# Patient Record
Sex: Female | Born: 1935 | Race: White | Hispanic: No | Smoking: Former smoker
Health system: Southern US, Community
[De-identification: ages and names within clinical notes are randomized; demographics above are authoritative.]

## PROBLEM LIST (undated history)

## (undated) DIAGNOSIS — I491 Atrial premature depolarization: Secondary | ICD-10-CM

## (undated) DIAGNOSIS — I10 Essential (primary) hypertension: Secondary | ICD-10-CM

## (undated) DIAGNOSIS — N189 Chronic kidney disease, unspecified: Secondary | ICD-10-CM

## (undated) DIAGNOSIS — E785 Hyperlipidemia, unspecified: Secondary | ICD-10-CM

## (undated) DIAGNOSIS — M1990 Unspecified osteoarthritis, unspecified site: Secondary | ICD-10-CM

## (undated) DIAGNOSIS — I509 Heart failure, unspecified: Secondary | ICD-10-CM

## (undated) DIAGNOSIS — D649 Anemia, unspecified: Secondary | ICD-10-CM

## (undated) DIAGNOSIS — M109 Gout, unspecified: Secondary | ICD-10-CM

## (undated) DIAGNOSIS — F419 Anxiety disorder, unspecified: Secondary | ICD-10-CM

## (undated) DIAGNOSIS — Z9981 Dependence on supplemental oxygen: Secondary | ICD-10-CM

## (undated) DIAGNOSIS — I701 Atherosclerosis of renal artery: Secondary | ICD-10-CM

## (undated) HISTORY — DX: Essential (primary) hypertension: I10

## (undated) HISTORY — DX: Unspecified osteoarthritis, unspecified site: M19.90

## (undated) HISTORY — DX: Chronic kidney disease, unspecified: N18.9

## (undated) HISTORY — DX: Hyperlipidemia, unspecified: E78.5

## (undated) HISTORY — PX: HAND SURGERY: SHX662

## (undated) HISTORY — PX: CATARACT EXTRACTION, BILATERAL: SHX1313

## (undated) HISTORY — PX: INSERTION OF DIALYSIS CATHETER: SHX1324

---

## 1998-03-08 ENCOUNTER — Other Ambulatory Visit: Admission: RE | Admit: 1998-03-08 | Discharge: 1998-03-08 | Payer: Self-pay | Admitting: Internal Medicine

## 1998-03-29 ENCOUNTER — Ambulatory Visit (HOSPITAL_BASED_OUTPATIENT_CLINIC_OR_DEPARTMENT_OTHER): Admission: RE | Admit: 1998-03-29 | Discharge: 1998-03-29 | Payer: Self-pay | Admitting: Orthopedic Surgery

## 1999-03-21 ENCOUNTER — Other Ambulatory Visit: Admission: RE | Admit: 1999-03-21 | Discharge: 1999-03-21 | Payer: Self-pay | Admitting: Internal Medicine

## 2000-03-21 ENCOUNTER — Encounter: Payer: Self-pay | Admitting: Internal Medicine

## 2000-03-21 ENCOUNTER — Encounter: Admission: RE | Admit: 2000-03-21 | Discharge: 2000-03-21 | Payer: Self-pay | Admitting: Internal Medicine

## 2000-06-11 HISTORY — PX: OTHER SURGICAL HISTORY: SHX169

## 2001-02-18 ENCOUNTER — Encounter: Admission: RE | Admit: 2001-02-18 | Discharge: 2001-02-18 | Payer: Self-pay | Admitting: Internal Medicine

## 2001-02-18 ENCOUNTER — Encounter: Payer: Self-pay | Admitting: Internal Medicine

## 2001-02-27 ENCOUNTER — Other Ambulatory Visit: Admission: RE | Admit: 2001-02-27 | Discharge: 2001-02-27 | Payer: Self-pay | Admitting: Internal Medicine

## 2002-03-18 ENCOUNTER — Encounter: Admission: RE | Admit: 2002-03-18 | Discharge: 2002-03-18 | Payer: Self-pay | Admitting: Internal Medicine

## 2002-03-18 ENCOUNTER — Encounter: Payer: Self-pay | Admitting: Internal Medicine

## 2003-04-02 ENCOUNTER — Encounter: Payer: Self-pay | Admitting: Internal Medicine

## 2003-04-02 ENCOUNTER — Encounter: Admission: RE | Admit: 2003-04-02 | Discharge: 2003-04-02 | Payer: Self-pay | Admitting: Internal Medicine

## 2004-05-10 ENCOUNTER — Encounter: Admission: RE | Admit: 2004-05-10 | Discharge: 2004-05-10 | Payer: Self-pay | Admitting: Internal Medicine

## 2004-12-29 ENCOUNTER — Ambulatory Visit (HOSPITAL_COMMUNITY): Admission: RE | Admit: 2004-12-29 | Discharge: 2004-12-29 | Payer: Self-pay | Admitting: Internal Medicine

## 2005-02-15 ENCOUNTER — Ambulatory Visit (HOSPITAL_COMMUNITY): Admission: RE | Admit: 2005-02-15 | Discharge: 2005-02-15 | Payer: Self-pay | Admitting: Vascular Surgery

## 2005-06-15 ENCOUNTER — Encounter: Admission: RE | Admit: 2005-06-15 | Discharge: 2005-06-15 | Payer: Self-pay | Admitting: Internal Medicine

## 2006-06-28 ENCOUNTER — Encounter: Admission: RE | Admit: 2006-06-28 | Discharge: 2006-06-28 | Payer: Self-pay | Admitting: Internal Medicine

## 2006-10-14 ENCOUNTER — Encounter: Admission: RE | Admit: 2006-10-14 | Discharge: 2006-10-14 | Payer: Self-pay | Admitting: Nephrology

## 2006-11-22 ENCOUNTER — Ambulatory Visit (HOSPITAL_COMMUNITY): Admission: RE | Admit: 2006-11-22 | Discharge: 2006-11-22 | Payer: Self-pay | Admitting: Nephrology

## 2006-11-29 ENCOUNTER — Ambulatory Visit: Payer: Self-pay | Admitting: Vascular Surgery

## 2007-07-23 ENCOUNTER — Encounter: Admission: RE | Admit: 2007-07-23 | Discharge: 2007-07-23 | Payer: Self-pay | Admitting: Internal Medicine

## 2008-09-02 ENCOUNTER — Encounter: Admission: RE | Admit: 2008-09-02 | Discharge: 2008-09-02 | Payer: Self-pay | Admitting: Internal Medicine

## 2008-12-17 ENCOUNTER — Ambulatory Visit: Payer: Self-pay | Admitting: Vascular Surgery

## 2009-09-13 ENCOUNTER — Encounter: Admission: RE | Admit: 2009-09-13 | Discharge: 2009-09-13 | Payer: Self-pay | Admitting: Internal Medicine

## 2009-12-21 ENCOUNTER — Ambulatory Visit: Payer: Self-pay | Admitting: Vascular Surgery

## 2010-09-22 ENCOUNTER — Other Ambulatory Visit: Payer: Self-pay | Admitting: Internal Medicine

## 2010-09-22 DIAGNOSIS — Z1231 Encounter for screening mammogram for malignant neoplasm of breast: Secondary | ICD-10-CM

## 2010-09-29 ENCOUNTER — Ambulatory Visit
Admission: RE | Admit: 2010-09-29 | Discharge: 2010-09-29 | Disposition: A | Discharge status: Home / Self Care | Payer: Medicare Other | Source: Ambulatory Visit | Attending: Internal Medicine | Admitting: Internal Medicine

## 2010-09-29 DIAGNOSIS — Z1231 Encounter for screening mammogram for malignant neoplasm of breast: Secondary | ICD-10-CM

## 2010-10-24 NOTE — Procedures (Signed)
RENAL ARTERY DUPLEX EVALUATION   INDICATION:  Follow up renal artery stenosis.   HISTORY:  Diabetes:  Yes.  Cardiac:  No.  Hypertension:  Yes.  Smoking:  Previous.   RENAL ARTERY DUPLEX FINDINGS:  Aorta-Proximal:  95 cm/s  Aorta-Mid:  Aorta-Distal:  Not visualized  Celiac Artery Origin:  169 cm/s  SMA Origin:  250 cm/s                                    RIGHT               LEFT  Renal Artery Origin:             75 cm/s             Not visualized  Renal Artery Proximal:           62 cm/s             400 cm/s  Renal Artery Mid:                75 cm/s             89 cm/s  Renal Artery Distal:             49 cm/s             Not visualized  Hilar Acceleration Time (AT):  Renal-Aortic Ratio (RAR):        1.3                 4.2  Kidney Size:                     9.9 cm              8.5 cm  End Diastolic Ratio (EDR):  Resistive Index (RI):            0.80                0.80   IMPRESSION:  1. Doppler velocities and renal aortic ratio suggest >60% stenosis of      the left proximal renal artery.  There is no significant stenosis      noted in the right renal artery.  2. Abnormal bilateral intrarenal resistive index noted.  3. Bilateral kidney lengths within normal limits; however, left kidney      is slightly smaller than the right.  4. There is a 2.0 cm cyst noted in the lower pole of the left kidney.  5. The distal aorta and left renal aorta origin and the distal renal      artery are not well visualized.  6. No significant changes from previous examinations.        ___________________________________________  Janetta Hora Fields, MD   NT/MEDQ  D:  12/21/2009  T:  12/21/2009  Job:  [id number]

## 2010-10-24 NOTE — Procedures (Signed)
RENAL ARTERY DUPLEX EVALUATION   INDICATION:  Increased creatinine of 3.0, renal artery stenosis.   HISTORY:  Diabetes:  Yes.  Cardiac:  No.  Hypertension:  Yes.  Smoking:  Previous.   RENAL ARTERY DUPLEX FINDINGS:  Aorta-Proximal:  85 cm/s  Aorta-Mid:  94 cm/s  Aorta-Distal:  Not visualized  Celiac Artery Origin:  141 cm/s  SMA Origin:  163 cm/s                                    RIGHT               LEFT  Renal Artery Origin:             119 cm/s            Not visualized  Renal Artery Proximal:           106 cm/s            521 cm/s  Renal Artery Mid:                85 cm/s             56 cm/s  Renal Artery Distal:             76 cm/s             25 cm/s  Hilar Acceleration Time (AT):  Renal-Aortic Ratio (RAR):        1.3                 5.5  Kidney Size:                     10.6 cm             9.3 cm  End Diastolic Ratio (EDR):  Resistive Index (RI):            0.91                0.84   IMPRESSION:  1. Doppler velocities and renal aortic ratios suggest a greater than      60% stenosis of the left proximal renal artery with no significant      stenosis noted in the right renal artery.  2. Abnormal bilateral intrarenal resistive indices noted.  3. The bilateral kidney length measurements are within normal limits,      however, the left kidney is slightly smaller than the right.  4. There is a 2.5 cm cyst noted in the lower pole of the left kidney.  5. The distal aorta and left renal artery origin were not adequately      visualized.  6. No significant change noted when compared to the previous exam on      11/29/2006.        ___________________________________________  Quita Skye. Hart Rochester, M.D.   CH/MEDQ  D:  12/17/2008  T:  12/17/2008  Job:  [id number]

## 2010-10-26 ENCOUNTER — Ambulatory Visit (INDEPENDENT_AMBULATORY_CARE_PROVIDER_SITE_OTHER): Payer: Medicare Other | Admitting: Vascular Surgery

## 2010-10-26 ENCOUNTER — Encounter (INDEPENDENT_AMBULATORY_CARE_PROVIDER_SITE_OTHER): Payer: Medicare Other

## 2010-10-26 DIAGNOSIS — Z01810 Encounter for preprocedural cardiovascular examination: Secondary | ICD-10-CM

## 2010-10-26 DIAGNOSIS — N186 End stage renal disease: Secondary | ICD-10-CM

## 2010-10-26 DIAGNOSIS — N184 Chronic kidney disease, stage 4 (severe): Secondary | ICD-10-CM

## 2010-10-27 NOTE — Assessment & Plan Note (Signed)
OFFICE VISIT  Kimberly Norman, Kimberly Norman DOB:  10/22/1918                                       10/26/2010 ZOXWR#:[id number]  CHIEF COMPLAINT:  Needs hemodialysis access.  HISTORY OF PRESENT ILLNESS:  The patient is an 77 year old female referred by Dr. Caryn Section for placement of long-term hemodialysis access.  She is currently not on dialysis.  She is right-handed.  Renal failure is thought to be secondary to multiple factors including hypertension, diabetes and left renal artery stenosis.  She also has pyelocaliectasis of the right kidney.  She currently does not have any symptoms of fluid overload or uremia.  Other chronic medical problems include intermittent claudication, hyperuricemia, hypercalcemia.  All of these problems are currently controlled and followed by Dr. Selena Batten and Dr. Caryn Section.  PAST SURGICAL HISTORY:  Uterine polyp in 1961, renal arteriogram by Dr. Hart Rochester in 2002.  SOCIAL HISTORY:  She is widowed.  She is retired.  She is a former smoker, quit in 1980.  She does not consume alcohol regularly.  FAMILY HISTORY:  Not remarkable for early vascular disease.  REVIEW OF SYSTEMS:  GI:  She has occasional constipation. URINARY:  As mentioned above. All other systems are negative.  MEDICATIONS: 1. Allopurinol 100 mg once a day. 2. Amlodipine 5 mg once a day. 3. Atorvastatin 40 mg once a day. 4. Carvedilol 6.25 mg 2 daily. 5. Furosemide 40 mg 2-1/2 daily. 6. Glimepiride 2 mg half tablet daily. 7. Hydralazine 10 mg 3 tablets daily. 8. Januvia 50 mg once daily. 9. Spirolactone 12.5 mg once daily. 10.Multivitamin.  ALLERGIES:  She has no known drug allergies.  PHYSICAL EXAM:  Vital signs:  Blood pressure is 178/75 in the left arm, heart rate 74 and regular, respirations 18.  HEENT:  Unremarkable. Neck:  Has 2+ carotid pulses without bruit.  Chest:  Clear to auscultation.  Cardiac:  Regular rate and rhythm without murmur. Abdomen:  Nontender, nondistended,  soft, no masses.  Musculoskeletal: Shows no obvious major joint deformities.  Neurologic:  Shows symmetric upper extremity and lower extremity motor strength which is 5/5.  Skin: Has no open ulcers or rashes.  Extremity vascular:  She has 2+ brachial, radial and femoral pulses bilaterally.  She had a vein mapping ultrasound performed today of both upper extremities.  Cephalic vein in the left arm was small and unusable.  The left basilic vein was between 40 and 60 mm in diameter.  In the right arm the right basilic vein was between 30 and 50 mm in diameter but is diminutive near the elbow.  In the right upper extremity cephalic vein the vein is between 23 mm at the wrist to as large as 50 mm at the antecubital area.  The upper arm cephalic vein is of slightly better quality but the right cephalic vein may be usable as well.  This also confirmed with placement of a tourniquet on the right arm.  I believe the best option for the patient at this point would be placement of a right radiocephalic AV fistula.  If the radial segment of the cephalic vein is small or the artery is small we would also consider a right brachiocephalic fistula at the same setting.  The patient wished to defer having her fistula placed immediately and has scheduled this for 12/27/2010.  Risks, benefits, possible complications and procedure details including but not  limited to bleeding, infection, ischemic steal, nonmaturation of the fistula were explained to the patient and her family today.  She understands and agrees to proceed.    Janetta Hora. Micharl Helmes, MD Electronically Signed  CEF/MEDQ  D:  10/26/2010  T:  10/27/2010  Job:  [id number]  cc:   Wilber Bihari. Caryn Section, M.D. Pamella Pert, MD Massie Maroon, MD

## 2010-10-27 NOTE — H&P (Signed)
Kimberly Norman, Kimberly Norman                 ACCOUNT NO.:  [id number]   MEDICAL RECORD NO.:  [id number]          PATIENT TYPE:  AMB   LOCATION:                               FACILITY:  [facility]   PHYSICIAN:  Quita Skye. Hart Rochester, M.D.  DATE OF BIRTH:  [birth year]   DATE OF ADMISSION:  02/15/2005  DATE OF DISCHARGE:                                HISTORY & PHYSICAL   PRIMARY PHYSICIAN:  Janae Bridgeman. Lendell Caprice, M.D.   CHIEF COMPLAINT:  Pain in both calves after walking 0.8 of a mile for the  past 6 months, newly found renal artery stenosis.   HISTORY OF PRESENT ILLNESS:  Kimberly Norman is an 77 year old Caucasian female  who was first evaluated by Dr. Quita Skye. Hart Rochester on February 06, 2005 for  bilateral lower extremity claudication symptoms relieved by rest. Pain is  worse in the left leg than in the right; and occurs after walking  approximately 0.8 of a mile.  She denies numbness and tingling other than  mild numbness of the tips of her left toe since undergoing surgery on her  left great toenail. She denies any rest pain or nonhealing ulcers. She has  mild peripheral edema, more pronounced in the left leg. Previously she has  undergone an MRA on December 29, 2004 which was reviewed by Dr. Hart Rochester and  revealed occlusive disease in a right superficial femoral artery and  proximal popliteal artery with one vessel runoff which reconstitutes  distally. The left leg had significant occlusive disease but no total  occlusion. She was also found to have what appeared to be a very tight left  renal artery stenosis and possible mild proximal right renal artery  stenosis. At this time due to her age and activity level, Dr. Hart Rochester  recommended that she increase her activity for her lower extremity occlusive  disease. He felt additional treatment should be reserved for worsening  symptoms. However, he did recommend that she undergo angiography for  possible PTA and stenting of her left renal artery with her history of  hypertension and mild renal insufficiency.   PAST MEDICAL HISTORY:  1.  Peripheral vascular occlusive disease with claudication.  2.  Renal artery stenosis as described above.  3.  Diabetes mellitus type 2.  4.  Hypertension.  5.  Hyperlipidemia.  6.  Mild renal insufficiency.  7.  Gouty arthritis.  8.  Osteoporosis.   PAST SURGICAL HISTORY:  1.  Right hand surgery.  2.  Removal of uterine polyps.  3.  Bilateral cataract extraction.   ALLERGIES:  She has no known drug allergies.   MEDICATIONS:  1.  Fosamax plus D 70 mg weekly.  2.  Metformin 500 mg p.o. b.i.d. (she was instructed to hold this until 48      hours following her procedure).  3.  Norvasc 5 mg p.o. daily.  4.  Lipitor 20 mg p.o. q.h.s.  5.  Furosemide 40 mg 1-1/2 tablets p.o. b.i.d.  6.  Allopurinol 100 mg p.o. daily.  7.  Calcium supplement daily.  8.  Multivitamin daily.   REVIEW OF  SYSTEMS:  She wears glasses. Has had bilateral ankle sprains in  the past; and is postmenopausal was 1975, otherwise, her review of systems  was negative per the CVTS review of systems questionnaire.   SOCIAL HISTORY:  She is married with no children. She used to smoke 1-1/2  packs of cigarettes per day from 1960 to 1977. She denies any alcohol use.  She is a retired Psychologist, occupational. She lives alone with her pets.   FAMILY HISTORY:  Her mother had a history of hypertension and died at age 67  of pancreatic cancer. Otherwise family history is negative for coronary  artery disease, diabetes mellitus, and stroke.   PHYSICAL EXAMINATION:  VITAL SIGNS:  Blood pressure 162/82, heart rate 96,  respirations 29.  GENERAL APPEARANCE:  This is an 77 year old Caucasian female who is alert,  cooperative and in no acute distress.  HEENT:  Her head is normocephalic and atraumatic. Her pupils are equal,  round, reacted to light and accommodation. Her sclerae nonicteric. Her oral  mucosa pink and moist. No lesions were noted.  NECK:  Her neck is  supple. No JVD or lymphadenopathy was noted. She has  palpable carotid pulses with no bruits auscultated.  RESPIRATORY:  Her lungs sounds were clear throughout. No wheezes or rhonchi  were noted.  CARDIAC:  Her heart has a regular rate and rhythm. No murmur, rub or gallop  was noted.  ABDOMEN:  Her abdomen was soft, nontender, nondistended with normoactive  bowel sounds x4. No hepatomegaly was noted.  GENITOURINARY/RECTAL:  These exams were deferred.  EXTREMITIES:  Her extremities are warm. She does have A trace ankle edema on  the left. She has 2+ radial pulses bilaterally and 2+ femoral pulses  bilaterally. Her dorsalis pedis pulse and posterior tibial pulse were not  definitively palpable. No ulcerations were noted.  NEUROLOGIC:  A neurologic exam was grossly intact. She is more alert and  oriented x4. Her speech is clear. Her gait is steady. Her bilateral upper  and lower muscle strength is strong and grossly symmetrical.   ASSESSMENT:  1.  Mild renal insufficiency per history with bilateral renal artery      stenosis, left greater than right with renovascular hypertension.  2.  Peripheral vascular occlusive disease with bilateral lower extremity      claudication, left greater than right.   PLAN:  1.  Arteriography with possible PTA and stenting of her left renal artery by      Dr. Quita Skye. Hart Rochester at [facility] on February 15, 2005.  2.  Increase activity for her lower extremity occlusive disease and reserve      additional treatment for worsening symptoms.      Jerold Coombe, P.A.    ______________________________  Quita Skye Hart Rochester, M.D.    AWZ/MEDQ  D:  02/13/2005  T:  02/13/2005  Job:  [id number]   cc:   Quita Skye. Hart Rochester, M.D.  [address]  [place]  [place] [address]

## 2010-10-27 NOTE — Op Note (Signed)
NAMEKESHONNA, VALVO                 ACCOUNT NO.:  [id number]   MEDICAL RECORD NO.:  [id number]          PATIENT TYPE:  AMB   LOCATION:  SDS                          FACILITY:  [facility]   PHYSICIAN:  Quita Skye. Hart Rochester, M.D.  DATE OF BIRTH:  04-Apr-1919   DATE OF PROCEDURE:  02/15/2005  DATE OF DISCHARGE:                                 OPERATIVE REPORT   PREOPERATIVE DIAGNOSIS:  Rule out renovascular hypertension with possible  severe left renal artery stenosis.   PROCEDURE:  1.  Abdominal aortogram via right common femoral approach.  2.  Selective left renal angiogram.   SURGEON:  Dr. Hart Rochester.   ANESTHESIA:  Local Xylocaine.   CONTRAST:  75 mL.   COMPLICATIONS:  None.   ESTIMATED BLOOD LOSS:  Minimal.   DESCRIPTION OF PROCEDURE:  The patient was taken to the [facility]  peripheral endovascular lab, placed in supine position at which time both  groins were prepped Betadine solution, draped in routine sterile manner.  After infiltration of 1% Xylocaine, the right common femoral artery was  entered percutaneously. Guidewire passed into the suprarenal aorta under  fluoroscopic guidance. A 5-French sheath and dilator were passed over the  guide wire, dilator removed. The standard pigtail catheter positioned in the  suprarenal aorta. A flush abdominal aortogram was performed injecting 20 mL  of contrast at 20 mL per second. This revealed the aorta to be widely patent  with both common internal and external iliac arteries to also be widely  patent with no evidence of significant stenosis. There was some moderate  occlusive disease in the proximal left renal artery and mild occlusive  disease in the proximal right renal artery and a magnification view was  obtained injecting 15 mL of contrast at 20 mL per second. This revealed the  origin of the right renal artery to have an approximate 30% stenosis and  what appeared to be an approximate 60% stenosis in the origin of the left  renal  artery. The pigtail catheter was exchanged for an IMA catheter and  selective left renal angiogram was performed with multiple views. There was  calcification over the first 2 cm of the proximal left renal artery and what  appeared to be an approximate 60% stenosis. The pressure gradient was  present occluded approximately 50 mm from the aorta to the distal renal  artery through the IMA catheter. After of multiple views were obtained, it  was felt that angioplasty and stenting would not be performed at this time  and the pigtail catheter was removed over the guidewire. The sheath was  removed, adequate compression applied. No complications ensued.   FINDINGS:  1.  Widely patent aortoiliac system.  2.  Mild right renal artery stenosis - 30%.  3.  Approximate 60% proximal left renal artery stenosis.           ______________________________  Quita Skye Hart Rochester, M.D.     JDL/MEDQ  D:  02/15/2005  T:  02/15/2005  Job:  [id number]

## 2010-11-04 NOTE — Procedures (Unsigned)
CEPHALIC VEIN MAPPING  INDICATION:  Chronic kidney disease, stage IV.  HISTORY: Diabetes, hypertension, hyperlipidemia, renal artery stenosis, and PVD.  EXAM: The right cephalic vein is compressible.  Diameter measurements range from 0.23 cm to 0.50 cm.  The right basilic vein is compressible.  Diameter measurements range from 0.36 to 0.56 cm.  The left cephalic vein is compressible.  Diameter measurements range from 0.13 cm to 0.27 cm.  The left basilic vein is compressible.  Diameter measurements range from 0.29 to 0.59 cm.  See attached worksheet for all measurements.  IMPRESSION:  Patent bilateral cephalic and basilic veins with diameter measurements as described above.  ___________________________________________ Janetta Hora. Fields, MD  SH/MEDQ  D:  10/26/2010  T:  10/26/2010  Job:  [id number]

## 2011-01-04 ENCOUNTER — Other Ambulatory Visit: Payer: Self-pay | Admitting: Vascular Surgery

## 2011-01-04 ENCOUNTER — Ambulatory Visit (HOSPITAL_COMMUNITY)
Admission: RE | Admit: 2011-01-04 | Discharge: 2011-01-04 | Disposition: A | Discharge status: Home / Self Care | Payer: Medicare Other | Source: Ambulatory Visit | Attending: Vascular Surgery | Admitting: Vascular Surgery

## 2011-01-04 ENCOUNTER — Encounter (HOSPITAL_COMMUNITY)
Admission: RE | Admit: 2011-01-04 | Discharge: 2011-01-04 | Disposition: A | Discharge status: Home / Self Care | Payer: Medicare Other | Source: Ambulatory Visit | Attending: Vascular Surgery | Admitting: Vascular Surgery

## 2011-01-04 DIAGNOSIS — Z01811 Encounter for preprocedural respiratory examination: Secondary | ICD-10-CM | POA: Insufficient documentation

## 2011-01-04 DIAGNOSIS — N186 End stage renal disease: Secondary | ICD-10-CM

## 2011-01-04 DIAGNOSIS — Z01810 Encounter for preprocedural cardiovascular examination: Secondary | ICD-10-CM | POA: Insufficient documentation

## 2011-01-04 DIAGNOSIS — I700 Atherosclerosis of aorta: Secondary | ICD-10-CM | POA: Insufficient documentation

## 2011-01-04 DIAGNOSIS — Z01812 Encounter for preprocedural laboratory examination: Secondary | ICD-10-CM | POA: Insufficient documentation

## 2011-01-04 LAB — SURGICAL PCR SCREEN: Staphylococcus aureus: NEGATIVE

## 2011-01-04 LAB — CBC
HCT: 33.4 % — ABNORMAL LOW (ref 36.0–46.0)
Hemoglobin: 11.8 g/dL — ABNORMAL LOW (ref 12.0–15.0)
MCH: 32.3 pg (ref 26.0–34.0)
RBC: 3.65 MIL/uL — ABNORMAL LOW (ref 3.87–5.11)

## 2011-01-04 LAB — BASIC METABOLIC PANEL
BUN: 56 mg/dL — ABNORMAL HIGH (ref 6–23)
Calcium: 10.6 mg/dL — ABNORMAL HIGH (ref 8.4–10.5)
Creatinine, Ser: 3.29 mg/dL — ABNORMAL HIGH (ref 0.50–1.10)
GFR calc Af Amer: 16 mL/min — ABNORMAL LOW (ref >60)
GFR calc non Af Amer: 13 mL/min — ABNORMAL LOW (ref >60)

## 2011-01-10 ENCOUNTER — Ambulatory Visit (HOSPITAL_COMMUNITY)
Admission: RE | Admit: 2011-01-10 | Discharge: 2011-01-10 | Disposition: A | Discharge status: Home / Self Care | Payer: Medicare Other | Source: Ambulatory Visit | Attending: Vascular Surgery | Admitting: Vascular Surgery

## 2011-01-10 DIAGNOSIS — Z01812 Encounter for preprocedural laboratory examination: Secondary | ICD-10-CM | POA: Insufficient documentation

## 2011-01-10 DIAGNOSIS — M109 Gout, unspecified: Secondary | ICD-10-CM | POA: Insufficient documentation

## 2011-01-10 DIAGNOSIS — N186 End stage renal disease: Secondary | ICD-10-CM

## 2011-01-10 DIAGNOSIS — I120 Hypertensive chronic kidney disease with stage 5 chronic kidney disease or end stage renal disease: Secondary | ICD-10-CM

## 2011-01-10 DIAGNOSIS — Z87891 Personal history of nicotine dependence: Secondary | ICD-10-CM | POA: Insufficient documentation

## 2011-01-10 DIAGNOSIS — E119 Type 2 diabetes mellitus without complications: Secondary | ICD-10-CM | POA: Insufficient documentation

## 2011-01-10 HISTORY — PX: AV FISTULA PLACEMENT: SHX1204

## 2011-01-10 LAB — POCT I-STAT 4, (NA,K, GLUC, HGB,HCT)
Glucose, Bld: 139 mg/dL — ABNORMAL HIGH (ref 70–99)
Hemoglobin: 11.2 g/dL — ABNORMAL LOW (ref 12.0–15.0)
Potassium: 4 mEq/L (ref 3.5–5.1)
Sodium: 134 mEq/L — ABNORMAL LOW (ref 135–145)

## 2011-01-10 LAB — GLUCOSE, CAPILLARY

## 2011-01-19 ENCOUNTER — Encounter: Payer: Self-pay | Admitting: Vascular Surgery

## 2011-01-25 ENCOUNTER — Encounter: Payer: Self-pay | Admitting: Vascular Surgery

## 2011-01-25 ENCOUNTER — Ambulatory Visit (INDEPENDENT_AMBULATORY_CARE_PROVIDER_SITE_OTHER): Payer: Medicare Other | Admitting: Vascular Surgery

## 2011-01-25 VITALS — BP 207/82 | HR 78 | Resp 16 | Ht 65.0 in | Wt 146.0 lb

## 2011-01-25 DIAGNOSIS — N186 End stage renal disease: Secondary | ICD-10-CM | POA: Insufficient documentation

## 2011-01-25 NOTE — Progress Notes (Signed)
Subjective:     Patient ID: Kimberly Norman, female   DOB: 1918-12-27, 77 y.o.   MRN: [id number]  HPI Patient is a 77 year old female who is status post a right radiocephalic AV fistula on 01/10/2011. She denies any numbness or tingling in her hand. She had some pain in her right wrist initially but this is now resolved. She is not currently on dialysis.  Review of Systems     Objective:   Physical Exam Blood pressure 207/82, pulse 78, resp. rate 16, height 5\' 5"  (1.651 m), weight 146 lb (66.225 kg).  Right wrist incision well-healed, palpable thrill    Assessment:     Healing right avf maturing well    Plan:     Follow up 3 months PA clinic to assess maturity

## 2011-02-08 NOTE — Op Note (Signed)
[name], Kimberly Norman                 ACCOUNT NO.:  [id number]  MEDICAL RECORD NO.:  [id number]  LOCATION:  SDSC                         FACILITY:  [facility]  PHYSICIAN:  Janetta Hora. Fields, MD  DATE OF BIRTH:  July 08, 1918  DATE OF PROCEDURE:  01/10/2011 DATE OF DISCHARGE:                              OPERATIVE REPORT   PROCEDURE:  Right radiocephalic AV fistula.  PREOPERATIVE DIAGNOSIS:  End-stage renal disease.  POSTOPERATIVE DIAGNOSIS:  End-stage renal disease.  ANESTHESIA:  Local with IV sedation.  ASSISTANT:  Newton Pigg, PA-C  OPERATIVE FINDINGS:  A 2.5-3-mm right cephalic vein.  OPERATIVE DETAILS:  After obtaining informed consent, the patient was taken to the operating room.  The patient was placed in supine position on operating table.  After adequate sedation, the patient's entire right upper extremity was prepped and draped in usual sterile fashion.  Local anesthesia was infiltrated over the area of the right radial artery near the wrist.  A longitudinal incision was made in this location, carried down through the subcutaneous tissues down to the level of the radial artery.  Radial artery was of good quality, it was soft in character and approximately 2.5 mm in diameter.  This was dissected free circumferentially and vessel loops were placed proximal and distal to the planned site of arteriotomy.  Several small side branches were controlled with small clips.  The patient's cephalic vein had a curve which placed it more on the dorsal aspect of the wrist.  Therefore, local anesthesia was infiltrated directly over this and an additional longitudinal incision was made parallel, but several centimeters away from the arterial exposure incision.  Incision was carried down through the subcutaneous tissues down to the level of cephalic vein.  The vein was of good quality.  This was dissected free circumferentially and small side branches ligated and divided between silk  ties.  The patient was given 5000 units of intravenous heparin.  The vein was then ligated distally with 2-0 silk tie and transected.  The vein was gently distended with heparinized saline.  Vein again was approximately 2.5-3 mm in diameter.  The vein was marked for orientation.  The vein was then tunneled subcutaneously under the skin bridge over to the radial artery. The radial artery was controlled proximally and distally with vessel loops.  A longitudinal opening was made in the radial artery.  The vein was slightly spatulated and sewn end of vein to side of artery using a running 7-0 Prolene suture.  Just prior to completion of anastomosis, this was forebled, backbled, and thoroughly flushed.  Anastomosis was secured.  Vessel loops were released.  There was a palpable thrill in the fistula immediately.  Subcutaneous tissues of both incisions were closed with a running 3-0 Vicryl suture.  Skin was closed with a 4-0 Monocryl stitch on both incisions. Dermabond was then applied.  The patient tolerated the procedure well and there were no complications.  Instrument, sponge, and needle counts were correct at the end of the case.  The patient was taken to recovery room in stable condition.     Janetta Hora. Fields, MD     CEF/MEDQ  D:  01/10/2011  T:  01/10/2011  Job:  [id number]  Electronically Signed by Fabienne Bruns MD on 02/08/2011 04:54:09 PM

## 2011-02-22 ENCOUNTER — Ambulatory Visit: Payer: Medicare Other

## 2011-03-08 ENCOUNTER — Encounter: Payer: Self-pay | Admitting: Vascular Surgery

## 2011-04-25 ENCOUNTER — Encounter: Payer: Self-pay | Admitting: Thoracic Diseases

## 2011-04-26 ENCOUNTER — Ambulatory Visit (INDEPENDENT_AMBULATORY_CARE_PROVIDER_SITE_OTHER): Payer: Medicare Other | Admitting: Thoracic Diseases

## 2011-04-26 VITALS — BP 203/84 | HR 115 | Resp 20 | Ht 64.5 in | Wt 147.0 lb

## 2011-04-26 DIAGNOSIS — N186 End stage renal disease: Secondary | ICD-10-CM

## 2011-04-26 NOTE — Progress Notes (Signed)
[facility]  Postoperative Visit hemodialysis access   Date of Surgery:01/10/11 Surgeon: CE Fields, MD   HPI: Kimberly Norman is a 77 y.o. female who is 12 weeks S/P creation/revision of right upper extremity Hemodialysis access. The patient denies symptoms of numbness, tingling, weakness and denies pain in the operative limb. Patient is here for post -op evaluation to assess healing and maturation of right Cimino Fistula .  Pt is not on hemodialysis   Physical Examination  Filed Vitals:   04/26/11 1602  BP: 203/84  Pulse: 115  Resp: 20    WDWN female in NAD.  right upper extremity Incision is healed Skin color is normal   Hand grip is 5/5 and sensation in digits is intact; There is a good thrill and good bruit in the right cimino Fistula. The graft/fistula is easily palpable and of adequate size  Assessment/Plan Kimberly Norman is a 77 y.o. year old who is s/p creation/revision of right upper extremity Hemodialysis access. Follow-up in as needed  The patient's access will be ready for use in immediately.  Clinic MD: CE Darrick Penna, MD

## 2012-02-10 ENCOUNTER — Emergency Department (HOSPITAL_COMMUNITY)
Admission: EM | Admit: 2012-02-10 | Discharge: 2012-02-10 | Disposition: A | Discharge status: Home / Self Care | Payer: Medicare Other | Attending: Emergency Medicine | Admitting: Emergency Medicine

## 2012-02-10 ENCOUNTER — Encounter (HOSPITAL_COMMUNITY): Payer: Self-pay | Admitting: Emergency Medicine

## 2012-02-10 ENCOUNTER — Emergency Department (HOSPITAL_COMMUNITY): Payer: Medicare Other

## 2012-02-10 DIAGNOSIS — E785 Hyperlipidemia, unspecified: Secondary | ICD-10-CM | POA: Insufficient documentation

## 2012-02-10 DIAGNOSIS — I129 Hypertensive chronic kidney disease with stage 1 through stage 4 chronic kidney disease, or unspecified chronic kidney disease: Secondary | ICD-10-CM | POA: Insufficient documentation

## 2012-02-10 DIAGNOSIS — R0602 Shortness of breath: Secondary | ICD-10-CM | POA: Insufficient documentation

## 2012-02-10 DIAGNOSIS — E119 Type 2 diabetes mellitus without complications: Secondary | ICD-10-CM | POA: Insufficient documentation

## 2012-02-10 DIAGNOSIS — F419 Anxiety disorder, unspecified: Secondary | ICD-10-CM

## 2012-02-10 DIAGNOSIS — R0600 Dyspnea, unspecified: Secondary | ICD-10-CM

## 2012-02-10 DIAGNOSIS — Z79899 Other long term (current) drug therapy: Secondary | ICD-10-CM | POA: Insufficient documentation

## 2012-02-10 DIAGNOSIS — N189 Chronic kidney disease, unspecified: Secondary | ICD-10-CM | POA: Insufficient documentation

## 2012-02-10 LAB — BASIC METABOLIC PANEL
BUN: 53 mg/dL — ABNORMAL HIGH (ref 6–23)
Calcium: 10.6 mg/dL — ABNORMAL HIGH (ref 8.4–10.5)
GFR calc Af Amer: 10 mL/min — ABNORMAL LOW (ref >90)
GFR calc non Af Amer: 9 mL/min — ABNORMAL LOW (ref >90)
Glucose, Bld: 140 mg/dL — ABNORMAL HIGH (ref 70–99)
Sodium: 137 mEq/L (ref 135–145)

## 2012-02-10 LAB — CBC
Hemoglobin: 10.4 g/dL — ABNORMAL LOW (ref 12.0–15.0)
MCH: 31.2 pg (ref 26.0–34.0)
MCHC: 33.8 g/dL (ref 30.0–36.0)
RDW: 14.2 % (ref 11.5–15.5)

## 2012-02-10 LAB — GLUCOSE, CAPILLARY

## 2012-02-10 NOTE — ED Provider Notes (Signed)
History     CSN: [id number]  Arrival date & time 02/10/12  1603   First MD Initiated Contact with Patient 02/10/12 1740      Chief Complaint  Patient presents with  . Shortness of Breath    (Consider location/radiation/quality/duration/timing/severity/associated sxs/prior treatment) HPI Comments: Ms. Leinbach presents for evaluation of shortness of breath.  After a busy morning, she returned home and attempted to take a nap at 13:30.  She was unable to go to sleep secondary to feeling short of breath.  She denies any fevers or pain but reports she stayed in bed for almost 2 hours prior to coming to the ER.  She had a similar episode in July and was seen by her primary physician.  She was treated with an antibiotic and albuterol inhaler as an outpt.  She used the inhaler today and reports some improvement since.  Currently she denies any shortness of breath.  Patient is a 78 y.o. female presenting with shortness of breath. The history is provided by the patient. No language interpreter was used.  Shortness of Breath  The current episode started today. The onset was sudden. The problem occurs continuously. The problem has been resolved. The problem is mild. The symptoms are relieved by beta-agonist inhalers. Nothing aggravates the symptoms. Associated symptoms include shortness of breath and wheezing. Pertinent negatives include no chest pain, no chest pressure, no orthopnea, no fever, no rhinorrhea, no sore throat, no stridor and no cough. There was no intake of a foreign body. She has not inhaled smoke recently. She has had no prior steroid use. She has had no prior hospitalizations. She has had no prior ICU admissions. She has had no prior intubations. Her past medical history is significant for past wheezing. Her past medical history does not include asthma, bronchiolitis, eczema or asthma in the family. She has been behaving normally. Urine output has been normal. The last void occurred less than 6  hours ago. There were no sick contacts. Recently, medical care has been given by the PCP.    Past Medical History  Diagnosis Date  . Hyperlipidemia   . Hypertension   . Diabetes mellitus Age 33    using insulin  . Chronic kidney disease   . Arthritis   . Osteoporosis   . Cataract   . Constipation 10/26/10    Past Surgical History  Procedure Date  . Renal angiogram 2002  . Cataract extraction, bilateral   . Hand surgery     right  . Av fistula placement 01/10/2011    Right radiocephalic AVF    Family History  Problem Relation Age of Onset  . Cancer Mother 39    pancreatic cancer  . Hypertension Mother     History  Substance Use Topics  . Smoking status: Former Smoker -- 17 years    Types: Cigarettes    Quit date: 01/24/1981  . Smokeless tobacco: Not on file  . Alcohol Use: No    OB History    Grav Para Term Preterm Abortions TAB SAB Ect Mult Living                  Review of Systems  Constitutional: Negative for fever, chills, diaphoresis, activity change, appetite change and fatigue.  HENT: Negative for congestion, sore throat, rhinorrhea, drooling, neck pain and neck stiffness.   Eyes: Negative.   Respiratory: Positive for shortness of breath and wheezing. Negative for cough, choking, chest tightness and stridor.   Cardiovascular: Negative for chest  pain, palpitations, orthopnea and leg swelling.  Gastrointestinal: Negative for nausea, vomiting, diarrhea and abdominal distention.  Genitourinary: Negative.   Musculoskeletal: Negative.   Skin: Negative.   Neurological: Negative.   Hematological: Negative.   Psychiatric/Behavioral: Negative.     Allergies  Review of patient's allergies indicates no known allergies.  Home Medications   Current Outpatient Rx  Name Route Sig Dispense Refill  . ALBUTEROL SULFATE HFA 108 (90 BASE) MCG/ACT IN AERS Inhalation Inhale 2 puffs into the lungs every 6 (six) hours as needed. For shortness of breath    .  ALLOPURINOL 100 MG PO TABS Oral Take 100 mg by mouth daily.      Marland Kitchen AMLODIPINE BESYLATE 5 MG PO TABS Oral Take 5 mg by mouth every evening.     . ATORVASTATIN CALCIUM 40 MG PO TABS Oral Take 40 mg by mouth daily.      Marland Kitchen CALCITRIOL 0.5 MCG PO CAPS Oral Take 0.5 mcg by mouth daily.    Marland Kitchen CARVEDILOL 6.25 MG PO TABS Oral Take 6.25 mg by mouth 2 (two) times daily with a meal.      . FUROSEMIDE 40 MG PO TABS Oral Take 40 mg by mouth 2 (two) times daily. 40 mg in the morning and 20 mg (1/2) tablet in the evening    . GLIMEPIRIDE 2 MG PO TABS Oral Take 2 mg by mouth daily.     Marland Kitchen HYDRALAZINE HCL 50 MG PO TABS Oral Take 50 mg by mouth 3 (three) times daily.    Marland Kitchen LORAZEPAM 0.5 MG PO TABS Oral Take 0.5 mg by mouth daily as needed. For anxiety caused by shortness of breath    . THERA M PLUS PO TABS Oral Take 1 tablet by mouth daily.    Marland Kitchen SITAGLIPTIN PHOSPHATE 50 MG PO TABS Oral Take 50 mg by mouth daily.     Marland Kitchen SPIRONOLACTONE 25 MG PO TABS Oral Take 12.5 mg by mouth daily.      BP 196/72  Pulse 89  Temp 98.6 F (37 C) (Oral)  Resp 18  SpO2 94%  Physical Exam  Nursing note and vitals reviewed. Constitutional: She is oriented to person, place, and time. She appears well-developed and well-nourished. No distress. She is not intubated.  HENT:  Head: Normocephalic and atraumatic.  Right Ear: External ear normal.  Left Ear: External ear normal.  Nose: Nose normal.  Mouth/Throat: Oropharynx is clear and moist. No oropharyngeal exudate.  Eyes: Conjunctivae and EOM are normal. Pupils are equal, round, and reactive to light. Right eye exhibits no discharge. Left eye exhibits no discharge. No scleral icterus.  Neck: Normal range of motion. Neck supple. No JVD present. No tracheal deviation present. No thyromegaly present.  Cardiovascular: Normal rate, regular rhythm, S1 normal, S2 normal, intact distal pulses and normal pulses.  PMI is not displaced.  Exam reveals friction rub. Exam reveals no gallop and no  decreased pulses.   No murmur heard. Pulmonary/Chest: Effort normal and breath sounds normal. No accessory muscle usage or stridor. No apnea, not tachypneic and not bradypneic. She is not intubated. No respiratory distress. She has no decreased breath sounds. She has no wheezes. She has no rales. She exhibits no tenderness, no bony tenderness, no deformity and no retraction.  Abdominal: Soft. Bowel sounds are normal. She exhibits no distension and no mass. There is no tenderness. There is no rebound and no guarding.  Musculoskeletal: Normal range of motion. She exhibits no edema and no tenderness.  Lymphadenopathy:  She has no cervical adenopathy.  Neurological: She is alert and oriented to person, place, and time. No cranial nerve deficit.  Skin: Skin is warm and dry. No rash noted. She is not diaphoretic. No erythema. No pallor.  Psychiatric: Her behavior is normal.    ED Course  Procedures (including critical care time)   Labs Reviewed  CBC  BASIC METABOLIC PANEL   Dg Chest 2 View  02/10/2012  *RADIOLOGY REPORT*  Clinical Data: Cough and shortness of breath which began earlier today.  CHEST - 2 VIEW  Comparison: Two-view chest x-ray 12/21/2011, 01/04/2011, 12/28/2009, 02/13/2005.  Findings: Cardiac silhouette moderately enlarged but stable. Thoracic aorta tortuous atherosclerotic, unchanged.  Hilar and mediastinal contours otherwise unremarkable.  Small to moderate- sized bilateral pleural effusions, left greater than right, increased in size since the most recent examination.  Mild diffuse interstitial pulmonary edema, slightly worse than on the prior examination.  Passive atelectasis in the lower lobes, left greater than right.  Osteopenia.  Degenerative changes throughout the thoracic spine.  IMPRESSION: Mild CHF, with stable moderate cardiomegaly and mild diffuse interstitial pulmonary edema.  Small to moderate sized bilateral effusions, left greater than right, and passive atelectasis  in the lower lobes.   Original Report Authenticated By: Arnell Sieving, M.D.      No diagnosis found.   Date: 02/10/2012  Rate: 82 bpm  Rhythm: normal sinus  QRS Axis: normal  Intervals: normal  ST/T Wave abnormalities: nonspecific ST changes  Conduction Disutrbances:none  Narrative Interpretation:   Old EKG Reviewed: unchanged      MDM  Pt presents for evaluation of shortness of breath.  She states that she feels better now.  Note normal respiratory effort, elevated BP, NAD.  CXR performed in triage demonstrates no infiltrate but mild edema and CHF.  She has a known hx of advanced renal failure and already has an AV fistula in place anticipating a future dialysis requirement.  Plan basic labs added to eval.  Will also se if O2 level drops while she is ambulating.  She currently demonstrates no clinical evidence of pneumonia or thromboembolic event.  She also denies chest pain.  2150.  Pt stable, NAD.  Pt is able to ambulate with only a small decrease in O2 sat.  She states this hs been a chronic issue.  She has no clinical evidence of PNA or PE.  She also has clear breath sounds and describes what sounds like panic or acute anxiety leading to these recurrent episodes of dyspnea,  She was even prescribed an anxiolytic by her physician.  Plan d/c home to f/u with her PMD.  Discussed at length indications for immediate return to the emergency department.      Tobin Chad, MD 02/10/12 2155

## 2012-02-10 NOTE — ED Notes (Signed)
Pt has right forearm graft that is positive for bruit and thrill.  Pt states that she has sob no matter what position and states increases with exertion.  Pt states it makes her anxious.

## 2012-02-10 NOTE — ED Notes (Signed)
Patient ambulated with pulse ox. Stast started at 95% with HR of 83. Wile walking patient stats dropped to 90% with HR of 95

## 2012-02-10 NOTE — ED Notes (Signed)
Dr.Powers to eval ecg at 16:43

## 2012-02-10 NOTE — ED Notes (Addendum)
C/o sob since 1:30pm today.  States she was unable to sleep this afternoon due to sob.  Pt reports same symptoms in July- diagnosed with pneumonia at that time.  Pt speaking in complete sentences.  NAD noted on triage exam.  Reports sob worse with exertion.

## 2012-06-02 ENCOUNTER — Encounter (HOSPITAL_COMMUNITY)
Admission: RE | Admit: 2012-06-02 | Discharge: 2012-06-02 | Disposition: A | Discharge status: Home / Self Care | Payer: Medicare Other | Source: Ambulatory Visit | Attending: Nephrology | Admitting: Nephrology

## 2012-06-02 DIAGNOSIS — N186 End stage renal disease: Secondary | ICD-10-CM | POA: Insufficient documentation

## 2012-06-02 DIAGNOSIS — D638 Anemia in other chronic diseases classified elsewhere: Secondary | ICD-10-CM | POA: Insufficient documentation

## 2012-06-02 MED ORDER — SODIUM CHLORIDE 0.9 % IV SOLN
INTRAVENOUS | Status: DC
  Administered 2012-06-02: 12:00:00 via INTRAVENOUS

## 2012-06-02 MED ORDER — FERUMOXYTOL INJECTION 510 MG/17 ML
INTRAVENOUS | Status: AC
  Administered 2012-06-02: 510 mg via INTRAVENOUS
  Filled 2012-06-02: qty 17

## 2012-06-02 MED ORDER — FERUMOXYTOL INJECTION 510 MG/17 ML
510.0000 mg | INTRAVENOUS | Status: DC
  Administered 2012-06-02: 510 mg via INTRAVENOUS

## 2012-06-09 ENCOUNTER — Encounter (HOSPITAL_COMMUNITY)
Admission: RE | Admit: 2012-06-09 | Discharge: 2012-06-09 | Disposition: A | Discharge status: Home / Self Care | Payer: Medicare Other | Source: Ambulatory Visit | Attending: Nephrology | Admitting: Nephrology

## 2012-06-09 MED ORDER — SODIUM CHLORIDE 0.9 % IV SOLN
INTRAVENOUS | Status: DC
  Administered 2012-06-09: 11:00:00 via INTRAVENOUS

## 2012-06-09 MED ORDER — FERUMOXYTOL INJECTION 510 MG/17 ML
510.0000 mg | INTRAVENOUS | Status: DC
  Administered 2012-06-09: 510 mg via INTRAVENOUS

## 2012-06-09 MED ORDER — FERUMOXYTOL INJECTION 510 MG/17 ML
INTRAVENOUS | Status: AC
  Filled 2012-06-09: qty 17

## 2012-06-12 ENCOUNTER — Inpatient Hospital Stay (HOSPITAL_COMMUNITY)
Admission: EM | Admit: 2012-06-12 | Discharge: 2012-06-20 | DRG: 291 | Disposition: A | Discharge status: Skilled Nursing Facility | Payer: Medicare Other | Attending: Internal Medicine | Admitting: Internal Medicine

## 2012-06-12 ENCOUNTER — Encounter (HOSPITAL_COMMUNITY): Payer: Self-pay | Admitting: Cardiology

## 2012-06-12 ENCOUNTER — Emergency Department (HOSPITAL_COMMUNITY): Payer: Medicare Other

## 2012-06-12 DIAGNOSIS — F411 Generalized anxiety disorder: Secondary | ICD-10-CM | POA: Diagnosis present

## 2012-06-12 DIAGNOSIS — R531 Weakness: Secondary | ICD-10-CM | POA: Diagnosis present

## 2012-06-12 DIAGNOSIS — Z87891 Personal history of nicotine dependence: Secondary | ICD-10-CM

## 2012-06-12 DIAGNOSIS — E119 Type 2 diabetes mellitus without complications: Secondary | ICD-10-CM

## 2012-06-12 DIAGNOSIS — I509 Heart failure, unspecified: Secondary | ICD-10-CM

## 2012-06-12 DIAGNOSIS — Z79899 Other long term (current) drug therapy: Secondary | ICD-10-CM

## 2012-06-12 DIAGNOSIS — M129 Arthropathy, unspecified: Secondary | ICD-10-CM | POA: Diagnosis present

## 2012-06-12 DIAGNOSIS — I5023 Acute on chronic systolic (congestive) heart failure: Secondary | ICD-10-CM | POA: Diagnosis present

## 2012-06-12 DIAGNOSIS — IMO0001 Reserved for inherently not codable concepts without codable children: Principal | ICD-10-CM | POA: Diagnosis present

## 2012-06-12 DIAGNOSIS — M810 Age-related osteoporosis without current pathological fracture: Secondary | ICD-10-CM | POA: Diagnosis present

## 2012-06-12 DIAGNOSIS — N039 Chronic nephritic syndrome with unspecified morphologic changes: Secondary | ICD-10-CM | POA: Diagnosis present

## 2012-06-12 DIAGNOSIS — N186 End stage renal disease: Secondary | ICD-10-CM

## 2012-06-12 DIAGNOSIS — D631 Anemia in chronic kidney disease: Secondary | ICD-10-CM | POA: Diagnosis present

## 2012-06-12 DIAGNOSIS — I959 Hypotension, unspecified: Secondary | ICD-10-CM | POA: Diagnosis not present

## 2012-06-12 DIAGNOSIS — E871 Hypo-osmolality and hyponatremia: Secondary | ICD-10-CM | POA: Diagnosis present

## 2012-06-12 DIAGNOSIS — E785 Hyperlipidemia, unspecified: Secondary | ICD-10-CM

## 2012-06-12 DIAGNOSIS — F419 Anxiety disorder, unspecified: Secondary | ICD-10-CM

## 2012-06-12 DIAGNOSIS — E8809 Other disorders of plasma-protein metabolism, not elsewhere classified: Secondary | ICD-10-CM | POA: Diagnosis present

## 2012-06-12 DIAGNOSIS — I701 Atherosclerosis of renal artery: Secondary | ICD-10-CM | POA: Diagnosis present

## 2012-06-12 DIAGNOSIS — F319 Bipolar disorder, unspecified: Secondary | ICD-10-CM | POA: Diagnosis present

## 2012-06-12 DIAGNOSIS — M109 Gout, unspecified: Secondary | ICD-10-CM

## 2012-06-12 DIAGNOSIS — R5381 Other malaise: Secondary | ICD-10-CM | POA: Diagnosis present

## 2012-06-12 DIAGNOSIS — E1129 Type 2 diabetes mellitus with other diabetic kidney complication: Secondary | ICD-10-CM

## 2012-06-12 DIAGNOSIS — M1990 Unspecified osteoarthritis, unspecified site: Secondary | ICD-10-CM | POA: Insufficient documentation

## 2012-06-12 DIAGNOSIS — J9600 Acute respiratory failure, unspecified whether with hypoxia or hypercapnia: Secondary | ICD-10-CM | POA: Diagnosis present

## 2012-06-12 DIAGNOSIS — I10 Essential (primary) hypertension: Secondary | ICD-10-CM | POA: Diagnosis present

## 2012-06-12 HISTORY — DX: Atherosclerosis of renal artery: I70.1

## 2012-06-12 HISTORY — DX: Gout, unspecified: M10.9

## 2012-06-12 HISTORY — DX: Anemia, unspecified: D64.9

## 2012-06-12 LAB — POCT I-STAT 3, ART BLOOD GAS (G3+)
Bicarbonate: 20.6 mEq/L (ref 20.0–24.0)
pCO2 arterial: 29.5 mmHg — ABNORMAL LOW (ref 35.0–45.0)
pH, Arterial: 7.452 — ABNORMAL HIGH (ref 7.350–7.450)
pO2, Arterial: 65 mmHg — ABNORMAL LOW (ref 80.0–100.0)

## 2012-06-12 LAB — BASIC METABOLIC PANEL
Calcium: 10.9 mg/dL — ABNORMAL HIGH (ref 8.4–10.5)
Creatinine, Ser: 4.48 mg/dL — ABNORMAL HIGH (ref 0.50–1.10)
GFR calc Af Amer: 9 mL/min — ABNORMAL LOW (ref >90)
GFR calc non Af Amer: 8 mL/min — ABNORMAL LOW (ref >90)

## 2012-06-12 LAB — CBC WITH DIFFERENTIAL/PLATELET
Basophils Absolute: 0 10*3/uL (ref 0.0–0.1)
Basophils Relative: 0 % (ref 0–1)
Eosinophils Absolute: 0 10*3/uL (ref 0.0–0.7)
Eosinophils Relative: 0 % (ref 0–5)
HCT: 29.9 % — ABNORMAL LOW (ref 36.0–46.0)
MCHC: 32.4 g/dL (ref 30.0–36.0)
MCV: 94.3 fL (ref 78.0–100.0)
Monocytes Absolute: 1 10*3/uL (ref 0.1–1.0)
Platelets: 249 10*3/uL (ref 150–400)
RDW: 14.8 % (ref 11.5–15.5)

## 2012-06-12 LAB — PRO B NATRIURETIC PEPTIDE: Pro B Natriuretic peptide (BNP): 30474 pg/mL — ABNORMAL HIGH (ref 0–450)

## 2012-06-12 MED ORDER — FUROSEMIDE 10 MG/ML IJ SOLN
80.0000 mg | Freq: Three times a day (TID) | INTRAMUSCULAR | Status: DC
  Administered 2012-06-13 – 2012-06-16 (×10): 80 mg via INTRAVENOUS
  Filled 2012-06-12 (×12): qty 8

## 2012-06-12 MED ORDER — HYDRALAZINE HCL 50 MG PO TABS
50.0000 mg | ORAL_TABLET | Freq: Three times a day (TID) | ORAL | Status: DC
  Administered 2012-06-12 – 2012-06-16 (×11): 50 mg via ORAL
  Filled 2012-06-12 (×13): qty 1

## 2012-06-12 MED ORDER — FUROSEMIDE 10 MG/ML IJ SOLN
80.0000 mg | Freq: Once | INTRAMUSCULAR | Status: AC
  Administered 2012-06-12: 80 mg via INTRAVENOUS
  Filled 2012-06-12: qty 8

## 2012-06-12 MED ORDER — ACETAMINOPHEN 325 MG PO TABS
650.0000 mg | ORAL_TABLET | ORAL | Status: DC | PRN
  Administered 2012-06-12: 325 mg via ORAL
  Administered 2012-06-15 – 2012-06-19 (×5): 650 mg via ORAL
  Filled 2012-06-12 (×2): qty 2
  Filled 2012-06-12: qty 1
  Filled 2012-06-12 (×2): qty 2

## 2012-06-12 MED ORDER — HYDROCODONE-ACETAMINOPHEN 5-325 MG PO TABS
1.0000 | ORAL_TABLET | Freq: Once | ORAL | Status: AC
  Administered 2012-06-12: 1 via ORAL
  Filled 2012-06-12: qty 1

## 2012-06-12 MED ORDER — CARVEDILOL 3.125 MG PO TABS
3.1250 mg | ORAL_TABLET | Freq: Two times a day (BID) | ORAL | Status: DC
  Administered 2012-06-13 – 2012-06-15 (×6): 3.125 mg via ORAL
  Filled 2012-06-12 (×9): qty 1

## 2012-06-12 MED ORDER — ATORVASTATIN CALCIUM 40 MG PO TABS
40.0000 mg | ORAL_TABLET | Freq: Every day | ORAL | Status: DC
  Administered 2012-06-13 – 2012-06-20 (×8): 40 mg via ORAL
  Filled 2012-06-12 (×8): qty 1

## 2012-06-12 MED ORDER — SODIUM CHLORIDE 0.9 % IV SOLN
INTRAVENOUS | Status: DC
  Administered 2012-06-12: 19:00:00 via INTRAVENOUS

## 2012-06-12 MED ORDER — ADULT MULTIVITAMIN W/MINERALS CH
1.0000 | ORAL_TABLET | Freq: Every day | ORAL | Status: DC
  Administered 2012-06-13: 1 via ORAL
  Filled 2012-06-12: qty 1

## 2012-06-12 MED ORDER — HEPARIN SODIUM (PORCINE) 5000 UNIT/ML IJ SOLN
5000.0000 [IU] | Freq: Three times a day (TID) | INTRAMUSCULAR | Status: DC
  Administered 2012-06-12 – 2012-06-20 (×23): 5000 [IU] via SUBCUTANEOUS
  Filled 2012-06-12 (×26): qty 1

## 2012-06-12 MED ORDER — INSULIN ASPART 100 UNIT/ML ~~LOC~~ SOLN
0.0000 [IU] | Freq: Three times a day (TID) | SUBCUTANEOUS | Status: DC
  Administered 2012-06-13: 2 [IU] via SUBCUTANEOUS
  Administered 2012-06-13 – 2012-06-14 (×2): 1 [IU] via SUBCUTANEOUS
  Administered 2012-06-14 – 2012-06-17 (×6): 2 [IU] via SUBCUTANEOUS
  Administered 2012-06-18: 100 [IU] via SUBCUTANEOUS
  Administered 2012-06-18: 1 [IU] via SUBCUTANEOUS
  Administered 2012-06-18 – 2012-06-19 (×2): 2 [IU] via SUBCUTANEOUS
  Administered 2012-06-19: 1 [IU] via SUBCUTANEOUS

## 2012-06-12 MED ORDER — SODIUM CHLORIDE 0.9 % IJ SOLN
3.0000 mL | Freq: Two times a day (BID) | INTRAMUSCULAR | Status: DC
  Administered 2012-06-12 – 2012-06-20 (×16): 3 mL via INTRAVENOUS

## 2012-06-12 MED ORDER — AMLODIPINE BESYLATE 5 MG PO TABS
5.0000 mg | ORAL_TABLET | Freq: Every evening | ORAL | Status: DC
  Administered 2012-06-13 – 2012-06-17 (×5): 5 mg via ORAL
  Filled 2012-06-12 (×6): qty 1

## 2012-06-12 MED ORDER — LORAZEPAM 0.5 MG PO TABS
0.5000 mg | ORAL_TABLET | Freq: Every day | ORAL | Status: DC | PRN
  Administered 2012-06-13: 0.5 mg via ORAL
  Filled 2012-06-12: qty 1

## 2012-06-12 MED ORDER — ALBUTEROL SULFATE HFA 108 (90 BASE) MCG/ACT IN AERS
2.0000 | INHALATION_SPRAY | RESPIRATORY_TRACT | Status: DC
  Filled 2012-06-12: qty 6.7

## 2012-06-12 NOTE — ED Provider Notes (Signed)
Kimberly Norman S 8:30 PM patient discussed in sign out with Dr. Blinda Leatherwood.  Patient arriving from PCP office with increased fluid on chest x-ray and shortness of breath symptoms. Patient found to have significantly elevated BNP despite recent outpatient increase in Lasix. Plan to have patient admitted for CHF exacerbation.  9:04PM spoke with triad hospitalists, Dr. Mahala Menghini.  He will see patient and admit. Would also like nephrology paged to his number to discuss decreased renal function.  Secretary will page nephrologist to Dr. Mahala Menghini.  Angus Seller, [place] 06/12/12 2109

## 2012-06-12 NOTE — ED Provider Notes (Addendum)
History     CSN: [id number]  Arrival date & time 06/12/12  1758   First MD Initiated Contact with Patient 06/12/12 1830      Chief Complaint  Patient presents with  . Shortness of Breath    (Consider location/radiation/quality/duration/timing/severity/associated sxs/prior treatment) HPI Comments: Patient sent to the emergency department for evaluation of difficulty breathing. The patient was sent to the ER by her primary care physician. Patient had an x-ray performed and she has a pleural effusion. Patient reports that she has had progressive worsening of her shortness of breath over a period of several days. She is nonicteric chest pain.   Past Medical History  Diagnosis Date  . Hyperlipidemia   . Hypertension   . Diabetes mellitus Age 79    using insulin  . Chronic kidney disease   . Arthritis   . Osteoporosis   . Cataract   . Constipation 10/26/10    Past Surgical History  Procedure Date  . Renal angiogram 2002  . Cataract extraction, bilateral   . Hand surgery     right  . Av fistula placement 01/10/2011    Right radiocephalic AVF    Family History  Problem Relation Age of Onset  . Cancer Mother 61    pancreatic cancer  . Hypertension Mother     History  Substance Use Topics  . Smoking status: Former Smoker -- 17 years    Types: Cigarettes    Quit date: 01/24/1981  . Smokeless tobacco: Not on file  . Alcohol Use: No    OB History    Grav Para Term Preterm Abortions TAB SAB Ect Mult Living                  Review of Systems  Respiratory: Positive for shortness of breath.   Cardiovascular: Negative for chest pain.  All other systems reviewed and are negative.    Allergies  Review of patient's allergies indicates no known allergies.  Home Medications   Current Outpatient Rx  Name  Route  Sig  Dispense  Refill  . ALBUTEROL SULFATE HFA 108 (90 BASE) MCG/ACT IN AERS   Inhalation   Inhale 2 puffs into the lungs every 6 (six) hours as needed. For  shortness of breath         . ALLOPURINOL 100 MG PO TABS   Oral   Take 100 mg by mouth daily.           Marland Kitchen AMLODIPINE BESYLATE 5 MG PO TABS   Oral   Take 5 mg by mouth every evening.          . ATORVASTATIN CALCIUM 40 MG PO TABS   Oral   Take 40 mg by mouth daily.           Marland Kitchen CALCITRIOL 0.5 MCG PO CAPS   Oral   Take 0.5 mcg by mouth daily.         Marland Kitchen CARVEDILOL 6.25 MG PO TABS   Oral   Take 6.25 mg by mouth 2 (two) times daily with a meal.           . FUROSEMIDE 40 MG PO TABS   Oral   Take 40-80 mg by mouth 2 (two) times daily. 2 tabs in the a.m. And 1 tab in the p.m.         Marland Kitchen GLIMEPIRIDE 2 MG PO TABS   Oral   Take 2 mg by mouth daily.          Marland Kitchen  HYDRALAZINE HCL 50 MG PO TABS   Oral   Take 50 mg by mouth 3 (three) times daily.         Marland Kitchen LORAZEPAM 0.5 MG PO TABS   Oral   Take 0.5 mg by mouth daily as needed. For anxiety caused by shortness of breath         . THERA M PLUS PO TABS   Oral   Take 1 tablet by mouth daily.         Marland Kitchen SITAGLIPTIN PHOSPHATE 50 MG PO TABS   Oral   Take 50 mg by mouth daily.          Marland Kitchen SPIRONOLACTONE 25 MG PO TABS   Oral   Take 12.5 mg by mouth daily.           BP 151/58  Pulse 80  Temp 98.7 F (37.1 C) (Oral)  Resp 22  Ht 5' 5.5" (1.664 m)  Wt 143 lb (64.864 kg)  BMI 23.43 kg/m2  SpO2 98%  Physical Exam  Constitutional: She is oriented to person, place, and time. She appears well-developed and well-nourished. No distress.  HENT:  Head: Normocephalic and atraumatic.  Right Ear: Hearing normal.  Nose: Nose normal.  Mouth/Throat: Oropharynx is clear and moist and mucous membranes are normal.  Eyes: Conjunctivae normal and EOM are normal. Pupils are equal, round, and reactive to light.  Neck: Normal range of motion. Neck supple.  Cardiovascular: Normal rate, regular rhythm, S1 normal and S2 normal.  Exam reveals no gallop and no friction rub.   No murmur heard. Pulmonary/Chest: No respiratory  distress. She has decreased breath sounds in the right middle field, the right lower field and the left lower field. She has rales in the right lower field and the left lower field. She exhibits no tenderness.  Abdominal: Soft. Normal appearance and bowel sounds are normal. There is no hepatosplenomegaly. There is no tenderness. There is no rebound, no guarding, no tenderness at McBurney's point and negative Murphy's sign. No hernia.  Musculoskeletal: Normal range of motion.  Neurological: She is alert and oriented to person, place, and time. She has normal strength. No cranial nerve deficit or sensory deficit. Coordination normal. GCS eye subscore is 4. GCS verbal subscore is 5. GCS motor subscore is 6.  Skin: Skin is warm, dry and intact. No rash noted. No cyanosis.  Psychiatric: She has a normal mood and affect. Her speech is normal and behavior is normal. Thought content normal.    ED Course  Procedures (including critical care time)   Date: 06/12/2012  Rate: 81  Rhythm: normal sinus rhythm  QRS Axis: normal  Intervals: normal  ST/T Wave abnormalities: nonspecific ST/T changes  Conduction Disutrbances:none  Narrative Interpretation:   Old EKG Reviewed: unchanged    Labs Reviewed  CBC WITH DIFFERENTIAL - Abnormal; Notable for the following:    WBC 10.9 (*)     RBC 3.17 (*)     Hemoglobin 9.7 (*)     HCT 29.9 (*)     Neutrophils Relative 83 (*)     Neutro Abs 9.0 (*)     Lymphocytes Relative 8 (*)     All other components within normal limits  BASIC METABOLIC PANEL - Abnormal; Notable for the following:    Sodium 132 (*)     Chloride 93 (*)     Glucose, Bld 148 (*)     BUN 66 (*)     Creatinine, Ser 4.48 (*)  Calcium 10.9 (*)     GFR calc non Af Amer 8 (*)     GFR calc Af Amer 9 (*)     All other components within normal limits  PRO B NATRIURETIC PEPTIDE - Abnormal; Notable for the following:    Pro B Natriuretic peptide (BNP) 30474.0 (*)     All other components  within normal limits  D-DIMER, QUANTITATIVE - Abnormal; Notable for the following:    D-Dimer, Quant 2.37 (*)     All other components within normal limits  POCT I-STAT 3, BLOOD GAS (G3+) - Abnormal; Notable for the following:    pH, Arterial 7.452 (*)     pCO2 arterial 29.5 (*)     pO2, Arterial 65.0 (*)     All other components within normal limits  TROPONIN I  CULTURE, BLOOD (ROUTINE X 2)  CULTURE, BLOOD (ROUTINE X 2)  BLOOD GAS, ARTERIAL   Dg Chest 2 View  06/12/2012  *RADIOLOGY REPORT*  Clinical Data: Short of breath  CHEST - 2 VIEW  Comparison: 06/12/2012  Findings: Mild cardiac enlargement.  Stable appearance of the right pleural effusion with overlying atelectasis/consolidation.  Smaller left effusion appears similar to previous exam.  Mild multilevel spondylosis is identified within the thoracic spine.  IMPRESSION:  1.  No change from previous exam. 2.  Persistent moderate right pleural effusion and smaller left effusion.   Original Report Authenticated By: Signa Kell, M.D.      1. CHF (congestive heart failure)       MDM  From primary care Dr. for increasing shortness of breath. Tablet reports that primary care doctor has increased the Lasix at home and the last one or 2 weeks without improvement. She was seen again today and x-ray showed pleural effusion. Patient was referred to ER for hypoxia. Workup confirms bilateral effusion, right greater than left. She was mildly hypoxic with a room air oxygen saturation of 90% on arrival. She did improve with supplemental oxygen by nasal cannula. Patient did show hypoxia but a blood gas as well. No CO2 retention. Workup is most consistent with congestive heart failure. Patient administered Lasix and will be admitted to the hospital.        Gilda Crease, MD 06/12/12 1610  Gilda Crease, MD 06/12/12 9604  Gilda Crease, MD 06/27/12 [id number]

## 2012-06-12 NOTE — H&P (Addendum)
[facility] History and Physical  Kimberly Norman ZOX:[id number] DOB: 02-Aug-1918 DOA: 06/12/2012  Referring physician: Blinda Leatherwood PCP: [name], MD  Specialists: Renal  Chief Complaint: SOB  HPI: Kimberly Norman is a 79 y.o. female who presented to [facility] ed with trouble breathing for the past several months [since summer] and a cough had developed-the cough developed maybe about 2 weeks ago the cough was associated.  No fever or chills, no sick contacts, no other issues. NO real Le swelling until  about 1 mo ago.  Rings and clothes didn't really feel tighter, Laying flat didn't make her feel SOB What really brought on symptoms was regular activity-She has had to curtail her Kimberly Norman has had to go a slower recently, and usually walked about 1 mile daily 2 years ago. She still regularly is able to participate in daily activities and went to her son's house for Christmas dinner but went a lot slower than usual according to her niece was in the room. Kidney issues seems to have started about 6 yrs ago-Dr. Selena Batten sent her over to see Dr. Caryn Section because of lab work.   Review of Systems: The patient denies CP, No sputum, +SOB Worsened over 2 days ago but inisidiouslty staretd in July, , no dark or tarry stool, no falls, no fevers, no diarrhoea but is constipated  Past Medical History  Diagnosis Date  . Hyperlipidemia   . Hypertension   . Diabetes mellitus Age 3    using insulin  . Chronic kidney disease   . Arthritis   . Osteoporosis   . Cataract   . Constipation 10/26/10   H/o renal artery stenosis   Past Surgical History  Procedure Date  . Renal angiogram 2002  . Cataract extraction, bilateral   . Hand surgery     right  . Av fistula placement 01/10/2011    Right radiocephalic AVF   Social History:  reports that she quit smoking about 31 years ago. Her smoking use included Cigarettes. She quit after 17 years of use. She does not have any smokeless tobacco history on file. She reports  that she does not drink alcohol or use illicit drugs. She lives alone at home and motor without the help of a walker or assistive aid She is totally independent and drives.  No Known Allergies  Family History  Problem Relation Age of Onset  . Cancer Mother 7    pancreatic cancer  . Hypertension Mother     Prior to Admission medications   Medication Sig Start Date End Date Taking? Authorizing Provider  albuterol (PROVENTIL HFA;VENTOLIN HFA) 108 (90 BASE) MCG/ACT inhaler Inhale 2 puffs into the lungs every 6 (six) hours as needed. For shortness of breath   Yes Historical Provider, MD  allopurinol (ZYLOPRIM) 100 MG tablet Take 100 mg by mouth daily.     Yes Historical Provider, MD  amLODipine (NORVASC) 5 MG tablet Take 5 mg by mouth every evening.    Yes Historical Provider, MD  atorvastatin (LIPITOR) 40 MG tablet Take 40 mg by mouth daily.     Yes Historical Provider, MD  calcitRIOL (ROCALTROL) 0.5 MCG capsule Take 0.5 mcg by mouth daily.   Yes Historical Provider, MD  carvedilol (COREG) 6.25 MG tablet Take 6.25 mg by mouth 2 (two) times daily with a meal.     Yes Historical Provider, MD  furosemide (LASIX) 40 MG tablet Take 40-80 mg by mouth 2 (two) times daily. 2 tabs in the a.m. And 1 tab  in the p.m.   Yes Historical Provider, MD  glimepiride (AMARYL) 2 MG tablet Take 2 mg by mouth daily.    Yes Historical Provider, MD  hydrALAZINE (APRESOLINE) 50 MG tablet Take 50 mg by mouth 3 (three) times daily.   Yes Historical Provider, MD  LORazepam (ATIVAN) 0.5 MG tablet Take 0.5 mg by mouth daily as needed. For anxiety caused by shortness of breath   Yes Historical Provider, MD  Multiple Vitamins-Minerals (MULTIVITAMINS THER. W/MINERALS) TABS Take 1 tablet by mouth daily.   Yes Historical Provider, MD  sitaGLIPtin (JANUVIA) 50 MG tablet Take 50 mg by mouth daily.    Yes Historical Provider, MD  spironolactone (ALDACTONE) 25 MG tablet Take 12.5 mg by mouth daily.   Yes Historical Provider, MD    Physical Exam: Filed Vitals:   06/12/12 1810 06/12/12 1824 06/12/12 1828 06/12/12 1918  BP: 144/62  156/74 151/58  Pulse: 81  84 80  Temp: 98.7 F (37.1 C)     TempSrc: Oral     Resp: 16  28 22   Height: 5' 5.5" (1.664 m)     Weight: 64.864 kg (143 lb)     SpO2: 91% 89% 98% 98%     General:  Her oriented pleasant Caucasian female in some moderate respiratory distress  Eyes: Arcus senilis present no pallor no icterus  ENT: Soft supple no thyromegaly lymphadenopathy  Neck: Soft supple  Cardiovascular: S1-S2 PACs otherwise regular rate rhythm  Respiratory: Clinically clear no added sound oral decreased bilaterally at bases  Abdomen: Soft nontender  Skin: Onychogryphosis both toes with scaly feet grade 1-2 [place] but he  Musculoskeletal: Range of motion intact  Psychiatric: Euthymic  Neurologic:  Moves all 4 limbs . Grossly strength is 5/5, reflexes 2/2, gait not assessed. Uvula midline, no facial dissymmetry  Labs on Admission:  Basic Metabolic Panel:  Lab 06/12/12 9604  NA 132*  K 4.4  CL 93*  CO2 21  GLUCOSE 148*  BUN 66*  CREATININE 4.48*  CALCIUM 10.9*  MG --  PHOS --   Liver Function Tests: No results found for this basename: AST:5,ALT:5,ALKPHOS:5,BILITOT:5,PROT:5,ALBUMIN:5 in the last 168 hours No results found for this basename: LIPASE:5,AMYLASE:5 in the last 168 hours No results found for this basename: AMMONIA:5 in the last 168 hours CBC:  Lab 06/12/12 1901  WBC 10.9*  NEUTROABS 9.0*  HGB 9.7*  HCT 29.9*  MCV 94.3  PLT 249   Cardiac Enzymes:  Lab 06/12/12 1901  CKTOTAL --  CKMB --  CKMBINDEX --  TROPONINI <0.30    BNP (last 3 results)  Basename 06/12/12 1901  PROBNP 30474.0*   CBG: No results found for this basename: GLUCAP:5 in the last 168 hours  Radiological Exams on Admission: Dg Chest 2 View  06/12/2012  *RADIOLOGY REPORT*  Clinical Data: Short of breath  CHEST - 2 VIEW  Comparison: 06/12/2012  Findings: Mild cardiac  enlargement.  Stable appearance of the right pleural effusion with overlying atelectasis/consolidation.  Smaller left effusion appears similar to previous exam.  Mild multilevel spondylosis is identified within the thoracic spine.  IMPRESSION:  1.  No change from previous exam. 2.  Persistent moderate right pleural effusion and smaller left effusion.   Original Report Authenticated By: Signa Kell, M.D.     EKG: Independently reviewed.  sensitive with PE he sees PR interval 0.08 QRS axis upward 0 no ST-T wave specific changes across precordium although there is some baseline depression of ST segments in V5 and 6.  Assessment/Plan Principal Problem:  *new onset CHF (NYHA class II, ACC/AHA stage C) Active Problems:  ESRD (end stage renal disease)  DM (diabetes mellitus)  Hyponatremia  Hyperlipidemia  Gout  HTN (hypertension)   1. Acute respiratory failure secondary to #2-probably secondary decompensated CHF-patient received increasing dosages of Lasix in the out-patient setting however still continued to have shortness of breath.  Continue Nasal canulae O2 2. New onset CHF-probably secondary to hypertension-patient no history of acute coronary syndrome .we will get an echocardiogram. She will need aggressive diuresis  And I will increase her Lasix to 80 mg IV 3 times a day. Foley replaced, strict I.'s and out. Please consult cardiology in the morning if feel appropriate -her Aldactone has been held -given her acute decompensation of CHF will decrease her Coreg from 6.25-3.125 3.  end-stage renal disease-creatinine has bumped from 3.8 4.5. I consulted nephrology to see her in consult, given she may end up needing volume moved removed more frequently 4. Diabetes mellitus-patient will be covered with sliding scale insulin only with at at bedtime at mealtime coverage. Her other hypoglycemics such as Sitagliptin and Glimepiride will be held for now. 5. Hypertension continue amlodipine 5 mg every  afternoon, Hydralazine 50 mg 3 times a day 6. History of gout-hold allopurinol. This will need renal dosing 7. Bipolar continue Ativan  0.5 daily when necessary as needed  Nephrology consult, please call cardiology once echocardiogram performed  if consultant consulted, please document name and whether formally or informally consulted  Code Status: Full  Family Communication: Discussed in detail with Niece, HCPOA at bedside who undertsands  Disposition Plan: 5-7 days, tele   Time spent: 9  Rhetta Mura [facility] Pager [phone]  If 7PM-7AM, please contact night-coverage [email or web address] Password [facility] 06/12/2012, 9:04 PM

## 2012-06-12 NOTE — Progress Notes (Signed)
Kimberly Norman made aware of D Dimer result. Pt complained of generalized pain.  1 tab tylenol plus 1 tab vicodin to be given per Kimberly Norman

## 2012-06-12 NOTE — ED Notes (Signed)
Pt reports she was sent here from by PCP office, states that her right lung is 1/3 of fluid per her MD. States she had x-rays done to confirm. Pt speaking in short sentences. Labored breathing.

## 2012-06-12 NOTE — ED Provider Notes (Signed)
Medical screening examination/treatment/procedure(s) were performed by non-physician practitioner and as supervising physician I was immediately available for consultation/collaboration.   David H Yao, MD 06/12/12 2350 

## 2012-06-13 ENCOUNTER — Encounter (HOSPITAL_COMMUNITY): Payer: Self-pay | Admitting: Physician Assistant

## 2012-06-13 DIAGNOSIS — E785 Hyperlipidemia, unspecified: Secondary | ICD-10-CM

## 2012-06-13 DIAGNOSIS — I509 Heart failure, unspecified: Secondary | ICD-10-CM

## 2012-06-13 DIAGNOSIS — I10 Essential (primary) hypertension: Secondary | ICD-10-CM

## 2012-06-13 DIAGNOSIS — I319 Disease of pericardium, unspecified: Secondary | ICD-10-CM

## 2012-06-13 DIAGNOSIS — I5021 Acute systolic (congestive) heart failure: Secondary | ICD-10-CM

## 2012-06-13 DIAGNOSIS — N186 End stage renal disease: Secondary | ICD-10-CM

## 2012-06-13 LAB — COMPREHENSIVE METABOLIC PANEL
ALT: 15 U/L (ref 0–35)
AST: 15 U/L (ref 0–37)
CO2: 21 mEq/L (ref 19–32)
Chloride: 99 mEq/L (ref 96–112)
Creatinine, Ser: 4.74 mg/dL — ABNORMAL HIGH (ref 0.50–1.10)
GFR calc non Af Amer: 7 mL/min — ABNORMAL LOW (ref >90)
Glucose, Bld: 74 mg/dL (ref 70–99)
Total Bilirubin: 0.2 mg/dL — ABNORMAL LOW (ref 0.3–1.2)

## 2012-06-13 LAB — HEPATITIS B SURFACE ANTIGEN: Hepatitis B Surface Ag: NEGATIVE

## 2012-06-13 LAB — GLUCOSE, CAPILLARY
Glucose-Capillary: 84 mg/dL (ref 70–99)
Glucose-Capillary: 161 mg/dL — ABNORMAL HIGH (ref 70–99)
Glucose-Capillary: 145 mg/dL — ABNORMAL HIGH (ref 70–99)

## 2012-06-13 LAB — CBC
MCH: 31.4 pg (ref 26.0–34.0)
MCHC: 33.2 g/dL (ref 30.0–36.0)
Platelets: 248 10*3/uL (ref 150–400)
RBC: 3.12 MIL/uL — ABNORMAL LOW (ref 3.87–5.11)

## 2012-06-13 LAB — HEPATITIS B CORE ANTIBODY, TOTAL: Hep B Core Total Ab: NEGATIVE

## 2012-06-13 LAB — PHOSPHORUS: Phosphorus: 6.7 mg/dL — ABNORMAL HIGH (ref 2.3–4.6)

## 2012-06-13 LAB — PROTIME-INR: Prothrombin Time: 14.3 seconds (ref 11.6–15.2)

## 2012-06-13 MED ORDER — LIDOCAINE-PRILOCAINE 2.5-2.5 % EX CREA
1.0000 "application " | TOPICAL_CREAM | CUTANEOUS | Status: DC | PRN
  Filled 2012-06-13: qty 5

## 2012-06-13 MED ORDER — NEPRO/CARBSTEADY PO LIQD
237.0000 mL | Freq: Three times a day (TID) | ORAL | Status: DC
  Administered 2012-06-14 – 2012-06-20 (×3): 237 mL via ORAL
  Filled 2012-06-13 (×9): qty 237

## 2012-06-13 MED ORDER — SODIUM CHLORIDE 0.9 % IV SOLN: 100.0000 mL | INTRAVENOUS | Status: DC | PRN

## 2012-06-13 MED ORDER — HEPARIN SODIUM (PORCINE) 1000 UNIT/ML DIALYSIS
1000.0000 [IU] | INTRAMUSCULAR | Status: DC | PRN
  Filled 2012-06-13: qty 1

## 2012-06-13 MED ORDER — PENTAFLUOROPROP-TETRAFLUOROETH EX AERO: 1.0000 "application " | INHALATION_SPRAY | CUTANEOUS | Status: DC | PRN

## 2012-06-13 MED ORDER — HEPARIN SODIUM (PORCINE) 1000 UNIT/ML DIALYSIS
20.0000 [IU]/kg | INTRAMUSCULAR | Status: DC | PRN
  Filled 2012-06-13: qty 2

## 2012-06-13 MED ORDER — RENA-VITE PO TABS
1.0000 | ORAL_TABLET | Freq: Every day | ORAL | Status: DC
  Administered 2012-06-14 – 2012-06-19 (×6): 1 via ORAL
  Filled 2012-06-13 (×6): qty 1

## 2012-06-13 MED ORDER — NEPRO/CARBSTEADY PO LIQD
237.0000 mL | ORAL | Status: DC | PRN
  Filled 2012-06-13: qty 237

## 2012-06-13 MED ORDER — DARBEPOETIN ALFA-POLYSORBATE 100 MCG/0.5ML IJ SOLN
100.0000 ug | INTRAMUSCULAR | Status: DC
  Administered 2012-06-20: 100 ug via INTRAVENOUS
  Filled 2012-06-13: qty 0.5

## 2012-06-13 MED ORDER — ALTEPLASE 2 MG IJ SOLR
2.0000 mg | Freq: Once | INTRAMUSCULAR | Status: AC | PRN
  Filled 2012-06-13: qty 2

## 2012-06-13 MED ORDER — LIDOCAINE HCL (PF) 1 % IJ SOLN: 5.0000 mL | INTRAMUSCULAR | Status: DC | PRN

## 2012-06-13 MED ORDER — ALBUTEROL SULFATE HFA 108 (90 BASE) MCG/ACT IN AERS
2.0000 | INHALATION_SPRAY | RESPIRATORY_TRACT | Status: DC | PRN
  Filled 2012-06-13: qty 6.7

## 2012-06-13 NOTE — Progress Notes (Signed)
Hypoglycemic Event  CBG: 64  Treatment: 15 GM carbohydrate snack  Symptoms: None  Follow-up CBG: Time:0715 CBG Result:84  Possible Reasons for Event: Unknown  Comments/MD notified:     Kimberly Norman, Chucky May  Remember to initiate Hypoglycemia Order Set & complete

## 2012-06-13 NOTE — Progress Notes (Signed)
  Echocardiogram 2D Echocardiogram has been performed.  Kimberly Norman 06/13/2012, 11:32 AM

## 2012-06-13 NOTE — Consult Note (Signed)
CARDIOLOGY CONSULT NOTE  Patient ID: Kimberly Norman, MRN: [id number], DOB/AGE: [birth year] 79 y.o. Admit date: 06/12/2012   Date of Consult: 06/13/2012 Primary Physician: [name], MD Primary Cardiologist: New to [facility] being seen by Dr. Patty Sermons  Chief Complaint: cough, SOB Reason for Consult: CHF, newly recognized LV dysfunction  HPI: Kimberly Norman is a pleasant, independent 79 y/o F with no prior cardiac history but a history of HTN, DM, HL, ESRD not yet on HD, chronic anemia, and renal artery stenosis. She presented to [facility] with complaints of cough and SOB. In July of this year, she awoke with an episode of acute SOB and panic without chest pain. She went to her PCP later that day and was diagnosed with a panic attack and a touch of pneumonia. She was treated with inhaler and antibiotic with improvement in SOB. Since that time, she feels she has had a dry hacking non-productive cough. It persists all day but seems to be worse at night. For the past 2 weeks she has developed SOB. She has baseline dyspnea, but feels that it is more easily brought on now. She also gets SOB "for a few minutes after new situations, like you coming into the room." She has to move slower. She has had to sleep sitting up more recently. No abd distention, LEE, fevers, chills, chest pain, or palpitations. She feels she has lost weight in the past few months because she just hasn't had much of an appetite. She was recently treated as outpatient with Lasix with no improvement in symptoms. She reports normal UOP. She saw her PCP yesterday and was found to have abnormal CXR so was sent to the ER for evaluation. CXR here showed mod R pleural eff, smaller L pleural eff. Cr 4.48, pBNP 30k, Na 132, Hgb 9.7 and WBC 10.9. D-dimer 2.37. Troponin neg x 1. 2D echo today demonstrated EF 25%, diffuse HK, grade 1 d/d, mild MR, mild-mod dilated LA, mildly reduced RV function, PA pressure 57-87mmHg. She received 80mg  Lasix last night and  this morning with rise in Cr to 4.74. Weight was 143 on admission (unsure of stated versus measured), is 137 today. She was already on BB prior to admission.  Past Medical History  Diagnosis Date  . Hyperlipidemia   . Hypertension   . Diabetes mellitus Age 79    using insulin  . Chronic kidney disease     ESRD, since ~2008  . Arthritis   . Osteoporosis   . Cataract   . Constipation 10/26/10  . Gout   . Renal artery stenosis     Angiogram 2006  . Anemia     Receives iron infusions per nephrology    Most Recent Cardiac Studies: 2D Echo 06/14/11 Study Conclusions - Left ventricle: The cavity size was at the upper limits of normal. Wall thickness was normal. The estimated ejection fraction was 25%. Diffuse hypokinesis. Doppler parameters are consistent with abnormal left ventricular relaxation (grade 1 diastolic dysfunction). - Aortic valve: There was no stenosis. - Mitral valve: Moderately calcified annulus. Mild regurgitation. - Left atrium: The atrium was mildly to moderately dilated. - Right ventricle: The cavity size was normal. Systolic function was mildly reduced. - Right atrium: The atrium was mildly dilated. - Tricuspid valve: Peak RV-RA gradient: 46mm Hg (S). - Pulmonary arteries: PA systolic pressure 57-61 mmHg. - Systemic veins: IVC measured 2.3 cm with < 50% respirophasic variation, suggesting RA pressure 11-15 mmHg. - Pericardium, extracardiac: Small posterior pericardial effusion. Impressions: -  Upper normal LV size with severe global hypokinesis, EF 25%. Mild mitral regurgitation. Normal RV size with mildly decreased systolic function. Moderate pulmonary hypertension.   Surgical History:  Past Surgical History  Procedure Date  . Renal angiogram 2002  . Cataract extraction, bilateral   . Hand surgery     right  . Av fistula placement 01/10/2011    Right radiocephalic AVF     Home Meds: Prior to Admission medications   Medication Sig Start Date End Date Taking?  Authorizing Provider  albuterol (PROVENTIL HFA;VENTOLIN HFA) 108 (90 BASE) MCG/ACT inhaler Inhale 2 puffs into the lungs every 6 (six) hours as needed. For shortness of breath   Yes Historical Provider, MD  allopurinol (ZYLOPRIM) 100 MG tablet Take 100 mg by mouth daily.     Yes Historical Provider, MD  amLODipine (NORVASC) 5 MG tablet Take 5 mg by mouth every evening.    Yes Historical Provider, MD  atorvastatin (LIPITOR) 40 MG tablet Take 40 mg by mouth daily.     Yes Historical Provider, MD  calcitRIOL (ROCALTROL) 0.5 MCG capsule Take 0.5 mcg by mouth daily.   Yes Historical Provider, MD  carvedilol (COREG) 6.25 MG tablet Take 6.25 mg by mouth 2 (two) times daily with a meal.     Yes Historical Provider, MD  furosemide (LASIX) 40 MG tablet Take 40-80 mg by mouth 2 (two) times daily. 2 tabs in the a.m. And 1 tab in the p.m.   Yes Historical Provider, MD  glimepiride (AMARYL) 2 MG tablet Take 2 mg by mouth daily.    Yes Historical Provider, MD  hydrALAZINE (APRESOLINE) 50 MG tablet Take 50 mg by mouth 3 (three) times daily.   Yes Historical Provider, MD  LORazepam (ATIVAN) 0.5 MG tablet Take 0.5 mg by mouth daily as needed. For anxiety caused by shortness of breath   Yes Historical Provider, MD  Multiple Vitamins-Minerals (MULTIVITAMINS THER. W/MINERALS) TABS Take 1 tablet by mouth daily.   Yes Historical Provider, MD  sitaGLIPtin (JANUVIA) 50 MG tablet Take 50 mg by mouth daily.    Yes Historical Provider, MD  spironolactone (ALDACTONE) 25 MG tablet Take 12.5 mg by mouth daily.   Yes Historical Provider, MD    Inpatient Medications:    . amLODipine  5 mg Oral QPM  . atorvastatin  40 mg Oral Daily  . carvedilol  3.125 mg Oral BID WC  . furosemide  80 mg Intravenous Q8H  . heparin  5,000 Units Subcutaneous Q8H  . hydrALAZINE  50 mg Oral TID  . insulin aspart  0-9 Units Subcutaneous TID WC  . multivitamin with minerals  1 tablet Oral Daily  . sodium chloride  3 mL Intravenous Q12H     Allergies: No Known Allergies  History   Social History  . Marital Status: Widowed    Spouse Name: N/A    Number of Children: N/A  . Years of Education: N/A   Occupational History  . Not on file.   Social History Main Topics  . Smoking status: Former Smoker -- 17 years    Types: Cigarettes    Quit date: 01/24/1981  . Smokeless tobacco: Not on file  . Alcohol Use: No  . Drug Use: No  . Sexually Active:    Other Topics Concern  . 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  Cancer Mother 68    pancreatic cancer  . Hypertension Mother      Review of Systems: General: negative for chills, fever, night sweats Cardiovascular: see above Dermatological: negative for rash Respiratory: see above Urologic: negative for hematuria Abdominal: negative for nausea, vomiting, diarrhea, bright red blood per rectum, melena, or hematemesis Neurologic: negative for visual changes, syncope, or dizziness All other systems reviewed and are otherwise negative except as noted above.  Labs:  [facility] 06/12/12 1901  CKTOTAL --  CKMB --  TROPONINI <0.30   Lab Results  Component Value Date   WBC 9.7 06/13/2012   HGB 9.8* 06/13/2012   HCT 29.5* 06/13/2012   MCV 94.6 06/13/2012   PLT 248 06/13/2012    Lab 06/13/12 0555  NA 137  K 4.5  CL 99  CO2 21  BUN 73*  CREATININE 4.74*  CALCIUM 9.9  PROT 6.9  BILITOT 0.2*  ALKPHOS 64  ALT 15  AST 15  GLUCOSE 74    Lab Results  Component Value Date   DDIMER 2.37* 06/12/2012    Radiology/Studies:  Dg Chest 2 View 06/12/2012  *RADIOLOGY REPORT*  Clinical Data: Short of breath  CHEST - 2 VIEW  Comparison: 06/12/2012  Findings: Mild cardiac enlargement.  Stable appearance of the right pleural effusion with overlying atelectasis/consolidation.  Smaller left effusion appears similar to previous exam.  Mild multilevel spondylosis is identified within the  thoracic spine.  IMPRESSION:  1.  No change from previous exam. 2.  Persistent moderate right pleural effusion and smaller left effusion.   Original Report Authenticated By: Signa Kell, M.D.    EKG:  06/13/11 at 18:27: irregular but appears NSR 81bpm with PACs, nonspecific T wave changes laterally 06/13/12 at 06:19: NSR 71bpm with PACs, nonspecific T wave changes laterally  Physical Exam: Blood pressure 131/48, pulse 72, temperature 97.6 F (36.4 C), temperature source Oral, resp. rate 18, height 5\' 5"  (1.651 m), weight 137 lb 9.6 oz (62.415 kg), SpO2 98.00%. General: Well developed, well nourished elderly WF in no acute distress. Head: Normocephalic, atraumatic, sclera non-icteric, no xanthomas, nares are without discharge.  Neck: Negative for carotid bruits. JVD not elevated. Lungs: Very decreased BS R 1/2 way up, decreased at bases on L. No wheezes, rales, or rhonchi. Breathing is unlabored. Heart: Regular rhythm with occasional ectopy, controlled rate, with S1 S2. No murmurs, rubs, or gallops appreciated. Abdomen: Soft, non-tender, non-distended with normoactive bowel sounds. No hepatomegaly. No rebound/guarding. No obvious abdominal masses. Msk:  Strength and tone appear normal for age. Extremities: No clubbing or cyanosis. Tr edema accentuated by sock lines. No erythema. Distal pedal pulses are 2+ and equal bilaterally. Neuro: Alert and oriented X 3. No facial asymmetry. No focal deficit. Moves all extremities spontaneously. Psych:  Responds to questions appropriately with a normal affect.   Assessment and Plan:   1. Acute systolic CHF with LV dysfunction EF 25%, also elevated R heart pressures 2. ESRD, not yet on HD 3. HTN 4. Diabetes mellitus 5. H/o renal artery stenosis  Suspect cough is related to congestive heart failure. We wonder if her episode in July of acute SOB was some sort of event contributing to her cardiomyopathy. Volume status however is unlikely to be controlled  with diuretics given her advanced renal failure. Renal is evaluating the patient as well and have placed orders for hemodialysis. Continue BB. No ACEI/ARB right now given renal dysfunction. In light of advanced age and absence of chest pain, would not pursue ischemic testing at  this time. Would optimize volume per renal and follow clinically.  Signed, Ronie Spies PA-C 06/13/2012, 2:19 PM Patient was seen on 4700 with Dayna Dunn PA-C.  The patient gives a history of gradually progressive problem with fluid overload manifested mainly by persistent dry cough.  She has not been experiencing any symptoms of angina pectoris.  She has had increasing exertional dyspnea.  Attempts at diuresis have resulted in worsening renal function.  She is now at the point of needing long-term hemodialysis.  She has significant left ventricular systolic dysfunction as well as evidence of elevated right-sided pressures.  Her electrocardiogram reviewed by myself shows normal sinus rhythm with LVH and mild strain pattern but no acute ischemic changes.  I feel that her present cardiac problems will best be served by instituting hemodialysis for removal of excessive volume.  Despite her advanced age of 78 the patient is quite alert and oriented and has been in relatively excellent health over the years.  I would expect her to do well with hemodialysis.

## 2012-06-13 NOTE — Consult Note (Signed)
[facility] Consult Note     Date: 06/13/2012                  Patient Name:  Kimberly Norman  MRN: [id number]  DOB: March 28, 1919  Age / Sex: 79 y.o., female         PCP: [name], MD                 Service Requesting Consult: [facility]                 Reason for Consult: CHF/volume overload refractory to medical management            Chief Complaint: Shortness of Breath  HPI: 18 F with history of Stage V CKD with AVF of right lower extremity not yet on HD, CHF with EF 25% who presented with shortness of breath. Patietn has had symptoms of worsening shortness of breath over the last 5 months. Patient also with cough for about 2 weeks. Denies fever/schills/nausea/vomiting/sick contacts. Symptoms have been associated with swelling in her legs which she has seen PCP and nephrologist for. Patient does describe SOB as worse at nighttime. Describes orthopnea with patient sitting up to sleep.  Nephrologist Dr. Caryn Section as recently as 12/20 increased lasix to attempt to improve swelling. Patient has also noticed that she has not been able to do things she previously was able to do such as having to walk slower when she previously could walk up to 2 miles. Shortness of breath has acutely worsened over last 2 days as well. Went to PCP who noted abnormal CXR  and sent to ED.   Renal consulted due to CHF/volume overload refractory to medical management as patient's creatinine increased while on high dose lasix overnight with only minimal diuretic response. Dr. Caryn Section is primary nephrologist who has been working with patient since 2008 (records placed in shadow chart).   Past Medical History  Diagnosis Date  . Hyperlipidemia   . Hypertension   . Diabetes mellitus Age 8    using insulin  . Chronic kidney disease     ESRD. Kidney dz since ~2008. s/p fistula placement.. Low office weight 143.   Marland Kitchen Arthritis   . Osteoporosis   . Cataract   . Constipation 10/26/10  . Gout   . Renal  artery stenosis     Angiogram 2006 shoed L RAS with normal right renal artery  . Anemia     Receives iron infusions per nephrology    Past Surgical History  Procedure Date  . Renal angiogram 2002  . Cataract extraction, bilateral   . Hand surgery     right  . Av fistula placement 01/10/2011    Right radiocephalic AVF    Family History  Problem Relation Age of Onset  . Cancer Mother 80    pancreatic cancer  . Hypertension Mother   . Heart disease Father     presumed. died at age 48   Social History:  reports that she quit smoking about 31 years ago. Her smoking use included Cigarettes. She quit after 17 years of use. She does not have any smokeless tobacco history on file. She reports that she does not drink alcohol or use illicit drugs.  Allergies: No Known Allergies  Medications Prior to Admission  Medication Sig Dispense Refill  . albuterol (PROVENTIL HFA;VENTOLIN HFA) 108 (90 BASE) MCG/ACT inhaler Inhale 2 puffs into the lungs every 6 (six) hours as needed. For shortness of breath      .  allopurinol (ZYLOPRIM) 100 MG tablet Take 100 mg by mouth daily.        Marland Kitchen amLODipine (NORVASC) 5 MG tablet Take 5 mg by mouth every evening.       Marland Kitchen atorvastatin (LIPITOR) 40 MG tablet Take 40 mg by mouth daily.        . calcitRIOL (ROCALTROL) 0.5 MCG capsule Take 0.5 mcg by mouth daily.      . carvedilol (COREG) 6.25 MG tablet Take 6.25 mg by mouth 2 (two) times daily with a meal.        . furosemide (LASIX) 40 MG tablet Take 40-80 mg by mouth 2 (two) times daily. 2 tabs in the a.m. And 1 tab in the p.m.      . glimepiride (AMARYL) 2 MG tablet Take 2 mg by mouth daily.       . hydrALAZINE (APRESOLINE) 50 MG tablet Take 50 mg by mouth 3 (three) times daily.      Marland Kitchen LORazepam (ATIVAN) 0.5 MG tablet Take 0.5 mg by mouth daily as needed. For anxiety caused by shortness of breath      . Multiple Vitamins-Minerals (MULTIVITAMINS THER. W/MINERALS) TABS Take 1 tablet by mouth daily.      .  sitaGLIPtin (JANUVIA) 50 MG tablet Take 50 mg by mouth daily.       Marland Kitchen spironolactone (ALDACTONE) 25 MG tablet Take 12.5 mg by mouth daily.        Results for orders placed during the hospital encounter of 06/12/12 (from the past 48 hour(s))  CBC WITH DIFFERENTIAL     Status: Abnormal   Collection Time   06/12/12  7:01 PM      Component Value Range Comment   WBC 10.9 (*) 4.0 - 10.5 K/uL    RBC 3.17 (*) 3.87 - 5.11 MIL/uL    Hemoglobin 9.7 (*) 12.0 - 15.0 g/dL    HCT 16.1 (*) 09.6 - 46.0 %    MCV 94.3  78.0 - 100.0 fL    MCH 30.6  26.0 - 34.0 pg    MCHC 32.4  30.0 - 36.0 g/dL    RDW 04.5  40.9 - 81.1 %    Platelets 249  150 - 400 K/uL    Neutrophils Relative 83 (*) 43 - 77 %    Neutro Abs 9.0 (*) 1.7 - 7.7 K/uL    Lymphocytes Relative 8 (*) 12 - 46 %    Lymphs Abs 0.8  0.7 - 4.0 K/uL    Monocytes Relative 9  3 - 12 %    Monocytes Absolute 1.0  0.1 - 1.0 K/uL    Eosinophils Relative 0  0 - 5 %    Eosinophils Absolute 0.0  0.0 - 0.7 K/uL    Basophils Relative 0  0 - 1 %    Basophils Absolute 0.0  0.0 - 0.1 K/uL   BASIC METABOLIC PANEL     Status: Abnormal   Collection Time   06/12/12  7:01 PM      Component Value Range Comment   Sodium 132 (*) 135 - 145 mEq/L    Potassium 4.4  3.5 - 5.1 mEq/L    Chloride 93 (*) 96 - 112 mEq/L    CO2 21  19 - 32 mEq/L    Glucose, Bld 148 (*) 70 - 99 mg/dL    BUN 66 (*) 6 - 23 mg/dL    Creatinine, Ser 9.14 (*) 0.50 - 1.10 mg/dL    Calcium 78.2 (*) 8.4 -  10.5 mg/dL    GFR calc non Af Amer 8 (*) >90 mL/min    GFR calc Af Amer 9 (*) >90 mL/min   PRO B NATRIURETIC PEPTIDE     Status: Abnormal   Collection Time   06/12/12  7:01 PM      Component Value Range Comment   Pro B Natriuretic peptide (BNP) 30474.0 (*) 0 - 450 pg/mL   TROPONIN I     Status: Normal   Collection Time   06/12/12  7:01 PM      Component Value Range Comment   Troponin I <0.30  <0.30 ng/mL   D-DIMER, QUANTITATIVE     Status: Abnormal   Collection Time   06/12/12  7:01 PM       Component Value Range Comment   D-Dimer, Quant 2.37 (*) 0.00 - 0.48 ug/mL-FEU   POCT I-STAT 3, BLOOD GAS (G3+)     Status: Abnormal   Collection Time   06/12/12  7:13 PM      Component Value Range Comment   pH, Arterial 7.452 (*) 7.350 - 7.450    pCO2 arterial 29.5 (*) 35.0 - 45.0 mmHg    pO2, Arterial 65.0 (*) 80.0 - 100.0 mmHg    Bicarbonate 20.6  20.0 - 24.0 mEq/L    TCO2 22  0 - 100 mmol/L    O2 Saturation 94.0      Acid-base deficit 2.0  0.0 - 2.0 mmol/L    Patient temperature 98.6 F      Collection site RADIAL, ALLEN'S TEST ACCEPTABLE      Drawn by Operator      Sample type ARTERIAL     GLUCOSE, CAPILLARY     Status: Abnormal   Collection Time   06/12/12 10:47 PM      Component Value Range Comment   Glucose-Capillary 111 (*) 70 - 99 mg/dL    Comment 1 Notify RN      Comment 2 Documented in Chart     COMPREHENSIVE METABOLIC PANEL     Status: Abnormal   Collection Time   06/13/12  5:55 AM      Component Value Range Comment   Sodium 137  135 - 145 mEq/L    Potassium 4.5  3.5 - 5.1 mEq/L    Chloride 99  96 - 112 mEq/L    CO2 21  19 - 32 mEq/L    Glucose, Bld 74  70 - 99 mg/dL    BUN 73 (*) 6 - 23 mg/dL    Creatinine, Ser 1.61 (*) 0.50 - 1.10 mg/dL    Calcium 9.9  8.4 - 09.6 mg/dL    Total Protein 6.9  6.0 - 8.3 g/dL    Albumin 2.8 (*) 3.5 - 5.2 g/dL    AST 15  0 - 37 U/L    ALT 15  0 - 35 U/L    Alkaline Phosphatase 64  39 - 117 U/L    Total Bilirubin 0.2 (*) 0.3 - 1.2 mg/dL    GFR calc non Af Amer 7 (*) >90 mL/min    GFR calc Af Amer 8 (*) >90 mL/min   CBC     Status: Abnormal   Collection Time   06/13/12  5:55 AM      Component Value Range Comment   WBC 9.7  4.0 - 10.5 K/uL    RBC 3.12 (*) 3.87 - 5.11 MIL/uL    Hemoglobin 9.8 (*) 12.0 - 15.0 g/dL    HCT 04.5 (*)  36.0 - 46.0 %    MCV 94.6  78.0 - 100.0 fL    MCH 31.4  26.0 - 34.0 pg    MCHC 33.2  30.0 - 36.0 g/dL    RDW 44.0  10.2 - 72.5 %    Platelets 248  150 - 400 K/uL   PROTIME-INR     Status: Normal    Collection Time   06/13/12  5:55 AM      Component Value Range Comment   Prothrombin Time 14.3  11.6 - 15.2 seconds    INR 1.13  0.00 - 1.49   GLUCOSE, CAPILLARY     Status: Abnormal   Collection Time   06/13/12  6:15 AM      Component Value Range Comment   Glucose-Capillary 65 (*) 70 - 99 mg/dL   GLUCOSE, CAPILLARY     Status: Abnormal   Collection Time   06/13/12  6:45 AM      Component Value Range Comment   Glucose-Capillary 65 (*) 70 - 99 mg/dL   GLUCOSE, CAPILLARY     Status: Normal   Collection Time   06/13/12  7:16 AM      Component Value Range Comment   Glucose-Capillary 84  70 - 99 mg/dL   HEPATITIS B SURFACE ANTIGEN     Status: Normal   Collection Time   06/13/12  9:02 AM      Component Value Range Comment   Hepatitis B Surface Ag NEGATIVE  NEGATIVE   HEPATITIS B CORE ANTIBODY, TOTAL     Status: Normal   Collection Time   06/13/12  9:02 AM      Component Value Range Comment   Hep B Core Total Ab NEGATIVE  NEGATIVE   GLUCOSE, CAPILLARY     Status: Abnormal   Collection Time   06/13/12 12:45 PM      Component Value Range Comment   Glucose-Capillary 161 (*) 70 - 99 mg/dL   GLUCOSE, CAPILLARY     Status: Abnormal   Collection Time   06/13/12  4:22 PM      Component Value Range Comment   Glucose-Capillary 144 (*) 70 - 99 mg/dL    Comment 1 Notify RN      Dg Chest 2 View  06/12/2012  *RADIOLOGY REPORT*  Clinical Data: Short of breath  CHEST - 2 VIEW  Comparison: 06/12/2012  Findings: Mild cardiac enlargement.  Stable appearance of the right pleural effusion with overlying atelectasis/consolidation.  Smaller left effusion appears similar to previous exam.  Mild multilevel spondylosis is identified within the thoracic spine.  IMPRESSION:  1.  No change from previous exam. 2.  Persistent moderate right pleural effusion and smaller left effusion.   Original Report Authenticated By: Signa Kell, M.D.     ROS-see HPI  Blood pressure 131/48, pulse 72, temperature 97.6 F (36.4 C),  temperature source Oral, resp. rate 18, height 5\' 5"  (1.651 m), weight 137 lb 9.6 oz (62.415 kg), SpO2 98.00%.   General Appearance:    Alert, cooperative, due to increased respiratory effort in mild distress but can speak in full sentences  Throat:   Mucus membranes slightly dry.   Neck:   no carotid  Bruit. Do not appreciate JVD  Lungs:     Clear to auscultation bilaterally anteriorly. Posteriorly lung sounds decreased at bases. Patient tacypneic and slightly labored breathing.    Heart:    Regular rate and rhythm, S1 and S2 normal, no murmur, rub   or gallop  Abdomen:     Soft, non-tender, bowel sounds active all four quadrants,    no masses, no organomegaly  Extremities:   1+ edema bilaterally.   Pulses:   2+ and symmetric all extremities  Neurologic:   Grossly normal, oriented x3, moves all extremities     Assessment/Plan 78F with CKD Stage V with acute CHF exacerbation refractory to medical management now requiring HD with access already in place.   1. Acute systolic CHF exacerbation with EF 25%-echo per cardiology revealed EF 25% with diffuse hypokinesis. No suggestion to cycle enzymes although patient may have remotely had cardiac event. Patient did produce 700 cc urine but creatinine trended up on 80mg  Lasix.  *Due to fluid overload refractory to medical management will start HD tonight.  *Appreciate Cardiology recommendations-continue beta blocker  2. CKD Stage V-patient with functional RLE AVF.  *Will follow symptomatically (SOB, dyspnea, oxygen) to determine timing of future HD sessions (Sat vs Sunday vs Monday). *lowest weight in office 143, now 137, difficult to estimate dry weight *Hep B negative studies *Creatinine trending up to 4.74 this AM. No electrolyte abnormalities.  *change to renal diet 80/90-2-2 1200 with carb modified carb controlled.   3. Anemia-% saturation 16% on 12/12 now s/p 2 Ferraheme doses outpatient. No indication to repeat at this time. Hgb stable  around baseline of 10. Will start Aranesp.   4. Secondary Hyperparathyroidism-PTH not elevated in December at 40. No adjustments to calcitriol.   5. Gout-consider dosing 100mg /day postdialysis only.   6. HTN-continue beta blocker and hydralazine. Stop spironolactone permanently given now on HD.  *follow closely with HD as may need to d/c hydralazine  7. DM-per primary team  8. Hypoalbuminemia-albumin 2.8. Ordered Nepro TID with meals as patient can tolerate  9. Hyperlipidemia-continue statin.   Aldine Contes. Marti Sleigh, MD, PGY2 06/13/2012 5:06 PMI have seen and examined this patient and agree with plan.  See my note from 5:44 PM 14 Jun 2011.  Start HD in hospital right away. Marina Gravel F,MD 06/14/2012 12:09 PM

## 2012-06-13 NOTE — Progress Notes (Signed)
[facility]  I have seen and examined Kimberly Norman with Dr. Janee Morn. She was admitted with SOB.  She has Bilat pl effusions and evidence of vol overload despite higher doses of furosemide.  She has been seen by me in office for last 6 years.  She has AVF in R forearm which will be used for dialysis.  She received IV Feraheme x 2 in December.  Will begin aranesp. Will also make arrangements for outpatient dialysis at [facility] dialysis center.  Kimberly Norman 06/13/2012, 5:45 PM

## 2012-06-13 NOTE — Progress Notes (Signed)
[facility] PROGRESS NOTE  KYLAN VEACH RUE:[id number] DOB: 19-Mar-1919 DOA: 06/12/2012 PCP: [name], MD  Assessment/Plan: 1. Principal Problem: 2.  *new onset CHF (NYHA class II, ACC/AHA stage C): New diagnosis. Awaiting cardiology consult. Continue diuresis. Awaiting echo results 3. Active Problems: 4.  ESRD (end stage renal disease): Stable. Nephrology to see. 5.  DM (diabetes mellitus): Stable. CBGs under 100 6.  Hyponatremia: Resolved and 7.  Hyperlipidemia 8.  Gout: Stable. Watch what she is being diuresed 9.  HTN (hypertension): Blood pressure felt to be stable 10.   Code Status: Full code Family Communication: Discussed with niece at the bedside Disposition Plan: Likely be here for several days, will need to see where she goes from here   Consultants:  [facility]  Procedures:  Echocardiogram done-results pending  Antibiotics:  None  HPI/Subjective: Patient herself somewhat uncomfortable. Main complaint is lower back pain brought on by uncomfortable bed. Breathing is still somewhat labored.  Objective: Filed Vitals:   06/12/12 2346 06/13/12 0216 06/13/12 0559 06/13/12 0924  BP:  121/57 122/59 145/52  Pulse:  58 75 65  Temp:  98.4 F (36.9 C) 97.1 F (36.2 C) 97.2 F (36.2 C)  TempSrc:  Oral Oral Oral  Resp:  18 20 20   Height: 5\' 5"  (1.651 m)     Weight: 62.46 kg (137 lb 11.2 oz)  62.415 kg (137 lb 9.6 oz)   SpO2:  96% 95%     Intake/Output Summary (Last 24 hours) at 06/13/12 1227 Last data filed at 06/13/12 0911  Gross per 24 hour  Intake    480 ml  Output    400 ml  Net     80 ml   Filed Weights   06/12/12 1810 06/12/12 2346 06/13/12 0559  Weight: 64.864 kg (143 lb) 62.46 kg (137 lb 11.2 oz) 62.415 kg (137 lb 9.6 oz)    Exam:   General:  Alert and oriented x3, mild distress secondary to back pain discomfort.  Cardiovascular: Regular rate and rhythm, S1-S2, 2/6 systolic ejection murmur  Respiratory: Decreased breath sounds  at the bases, few rhonchi  Abdomen: Soft, nontender, nondistended, positive bowel sounds  Extremities: No clubbing or cyanosis, trace pitting edema  Data Reviewed: Basic Metabolic Panel:  Lab 06/13/12 1478 06/12/12 1901  NA 137 132*  K 4.5 4.4  CL 99 93*  CO2 21 21  GLUCOSE 74 148*  BUN 73* 66*  CREATININE 4.74* 4.48*  CALCIUM 9.9 10.9*  MG -- --  PHOS -- --   Liver Function Tests:  Lab 06/13/12 0555  AST 15  ALT 15  ALKPHOS 64  BILITOT 0.2*  PROT 6.9  ALBUMIN 2.8*   CBC:  Lab 06/13/12 0555 06/12/12 1901  WBC 9.7 10.9*  NEUTROABS -- 9.0*  HGB 9.8* 9.7*  HCT 29.5* 29.9*  MCV 94.6 94.3  PLT 248 249   Cardiac Enzymes:  Lab 06/12/12 1901  CKTOTAL --  CKMB --  CKMBINDEX --  TROPONINI <0.30   BNP (last 3 results)  Basename 06/12/12 1901  PROBNP 30474.0*   CBG:  Lab 06/13/12 0716 06/13/12 0645 06/13/12 0615 06/12/12 2247  GLUCAP 84 65* 65* 111*      Studies: Dg Chest 2 View  06/12/2012   IMPRESSION:  1.  No change from previous exam. 2.  Persistent moderate right pleural effusion and smaller left effusion.   Original Report Authenticated By: Signa Kell, M.D.     Scheduled Meds:   . amLODipine  5 mg  Oral QPM  . atorvastatin  40 mg Oral Daily  . carvedilol  3.125 mg Oral BID WC  . furosemide  80 mg Intravenous Q8H  . heparin  5,000 Units Subcutaneous Q8H  . hydrALAZINE  50 mg Oral TID  . insulin aspart  0-9 Units Subcutaneous TID WC  . multivitamin with minerals  1 tablet Oral Daily  . sodium chloride  3 mL Intravenous Q12H   Continuous Infusions:   Principal Problem:  *new onset CHF (NYHA class II, ACC/AHA stage C) Active Problems:  ESRD (end stage renal disease)  DM (diabetes mellitus)  Hyponatremia  Hyperlipidemia  Gout  HTN (hypertension)    Time spent: 30 minutes    Hollice Espy  [facility] Pager [phone]. If 8PM-8AM, please contact night-coverage at [email or web address], password [facility] 06/13/2012, 12:27 PM  LOS: 1  day

## 2012-06-14 DIAGNOSIS — E119 Type 2 diabetes mellitus without complications: Secondary | ICD-10-CM

## 2012-06-14 LAB — GLUCOSE, CAPILLARY
Glucose-Capillary: 127 mg/dL — ABNORMAL HIGH (ref 70–99)
Glucose-Capillary: 124 mg/dL — ABNORMAL HIGH (ref 70–99)
Glucose-Capillary: 139 mg/dL — ABNORMAL HIGH (ref 70–99)

## 2012-06-14 LAB — BASIC METABOLIC PANEL
CO2: 20 mEq/L (ref 19–32)
Chloride: 97 mEq/L (ref 96–112)
Glucose, Bld: 131 mg/dL — ABNORMAL HIGH (ref 70–99)
Sodium: 137 mEq/L (ref 135–145)

## 2012-06-14 LAB — URIC ACID: Uric Acid, Serum: 6.8 mg/dL (ref 2.4–7.0)

## 2012-06-14 MED ORDER — DOXERCALCIFEROL 4 MCG/2ML IV SOLN
1.0000 ug | INTRAVENOUS | Status: DC
  Administered 2012-06-17: 1 ug via INTRAVENOUS
  Filled 2012-06-14 (×2): qty 2

## 2012-06-14 MED ORDER — PENTAFLUOROPROP-TETRAFLUOROETH EX AERO
INHALATION_SPRAY | CUTANEOUS | Status: AC
  Filled 2012-06-14: qty 103.5

## 2012-06-14 NOTE — Progress Notes (Signed)
ATTEMPTED TO START FIRST HD VIA AVF, INFILTRATED, PRESSURE AND ICE APPLIED TO INFILTRATED AREA. DR. Arlean Hopping NOTIFIED AND WILL ASSESS PT ARM. TOOK PT BACK TO ROOM. WILL FOLLOW UP WITH NEPHROLOGIST LATER.

## 2012-06-14 NOTE — Progress Notes (Signed)
[facility] PROGRESS NOTE  Kimberly Norman WUJ:[id number] DOB: 25-Jan-1919 DOA: 06/12/2012 PCP: [name], MD  Assessment/Plan: 1. Principal Problem: 2.  *new onset CHF (NYHA class II, ACC/AHA stage C): New diagnosis. Awaiting cardiology consult. Mild results with diuresis, although weight is down 7 pounds from admission. Echocardiogram notes global hypokinesis with a significantly decreased ejection fraction of 25% as well as grade 1 diastolic dysfunction moderate pulmonary hypertension. Appreciate cardiology consult 3. Active Problems: 4.  ESRD (end stage renal disease): Appreciate nephrology consult. Await to see if decision will be to do the hemodialysis on patient during hospitalization 5.  DM (diabetes mellitus): Stable. CBGs ranging from 80s to 140s 6.  Hyponatremia: Resolved with diuresis 7.  Hyperlipidemia 8.  Gout: Stable. Watch what she is being diuresed 9.  HTN (hypertension): Blood pressure stable with a systolic in the 130s 10. Anemia of chronic disease: Stable  Code Status: Full code Family Communication: Discussed with niece at the bedside Disposition Plan: Likely be here for several days, will need to see where she goes from here   Consultants:  [facility]  Procedures:  Echocardiogram done-global hypokinesis. Grade 1 diastolic dysfunction. Ejection fraction 25%. Pulmonary hypertension-moderate  Antibiotics:  None  HPI/Subjective: Patient herself somewhat uncomfortable. Didn't sleep well. Breathing is a little easier although not much. Fatigued.  Objective: Filed Vitals:   06/13/12 1816 06/13/12 2005 06/14/12 0434 06/14/12 0944  BP: 156/77 162/50 152/68 134/68  Pulse: 82 72 99   Temp:  98.5 F (36.9 C) 98.3 F (36.8 C)   TempSrc:  Oral Oral   Resp:  18 23   Height:      Weight:   61.78 kg (136 lb 3.2 oz)   SpO2: 96% 94% 93%     Intake/Output Summary (Last 24 hours) at 06/14/12 1017 Last data filed at 06/14/12 0802  Gross per 24 hour    Intake    960 ml  Output    900 ml  Net     60 ml   Filed Weights   06/12/12 2346 06/13/12 0559 06/14/12 0434  Weight: 62.46 kg (137 lb 11.2 oz) 62.415 kg (137 lb 9.6 oz) 61.78 kg (136 lb 3.2 oz)    Exam:   General:  Alert and oriented x3, mild distress secondary to back pain discomfort.  Cardiovascular: Regular rate and rhythm, S1-S2, 2/6 systolic ejection murmur  Respiratory: Decreased breath sounds at the bases,   Abdomen: Soft, nontender, nondistended, positive bowel sounds  Extremities: No clubbing or cyanosis, trace pitting edema  Data Reviewed: Basic Metabolic Panel:  Lab 06/14/12 9562 06/13/12 1758 06/13/12 0555 06/12/12 1901  NA 137 -- 137 132*  K 4.0 -- 4.5 4.4  CL 97 -- 99 93*  CO2 20 -- 21 21  GLUCOSE 131* -- 74 148*  BUN 78* -- 73* 66*  CREATININE 4.88* -- 4.74* 4.48*  CALCIUM 9.8 -- 9.9 10.9*  MG -- -- -- --  PHOS -- 6.7* -- --   Liver Function Tests:  Lab 06/13/12 0555  AST 15  ALT 15  ALKPHOS 64  BILITOT 0.2*  PROT 6.9  ALBUMIN 2.8*   CBC:  Lab 06/13/12 0555 06/12/12 1901  WBC 9.7 10.9*  NEUTROABS -- 9.0*  HGB 9.8* 9.7*  HCT 29.5* 29.9*  MCV 94.6 94.3  PLT 248 249   Cardiac Enzymes:  Lab 06/12/12 1901  CKTOTAL --  CKMB --  CKMBINDEX --  TROPONINI <0.30   BNP (last 3 results)  Basename 06/12/12 1901  PROBNP 30474.0*   CBG:  Lab 06/14/12 0632 06/14/12 0450 06/13/12 2111 06/13/12 1622 06/13/12 1245  GLUCAP 124* 127* 145* 144* 161*      Studies: Dg Chest 2 View  06/12/2012   IMPRESSION:  1.  No change from previous exam. 2.  Persistent moderate right pleural effusion and smaller left effusion.   Original Report Authenticated By: Signa Kell, M.D.     Scheduled Meds:    . amLODipine  5 mg Oral QPM  . atorvastatin  40 mg Oral Daily  . carvedilol  3.125 mg Oral BID WC  . darbepoetin (ARANESP) injection - DIALYSIS  100 mcg Intravenous Q Fri-HD  . feeding supplement (NEPRO CARB STEADY)  237 mL Oral TID WC  .  furosemide  80 mg Intravenous Q8H  . heparin  5,000 Units Subcutaneous Q8H  . hydrALAZINE  50 mg Oral TID  . insulin aspart  0-9 Units Subcutaneous TID WC  . multivitamin  1 tablet Oral Daily  . sodium chloride  3 mL Intravenous Q12H   Continuous Infusions:   Principal Problem:  *new onset CHF (NYHA class II, ACC/AHA stage C) Active Problems:  ESRD (end stage renal disease)  DM (diabetes mellitus)  Hyponatremia  Hyperlipidemia  Gout  HTN (hypertension)    Time spent: 30 minutes    Kimberly Norman  [facility] Pager [phone]. If 8PM-8AM, please contact night-coverage at [email or web address], password [facility] 06/14/2012, 10:17 AM  LOS: 2 days

## 2012-06-14 NOTE — Progress Notes (Signed)
Patient ID: Kimberly Norman, female   DOB: 1918-07-14, 79 y.o.   MRN: [id number] Subjective:  "I can't sleep well" dyspnea present but not worse, awaiting HD  Objective:  Vital Signs in the last 24 hours: Temp:  [97.6 F (36.4 C)-98.5 F (36.9 C)] 98.3 F (36.8 C) (01/04 0434) Pulse Rate:  [72-99] 99  (01/04 0434) Resp:  [18-23] 23  (01/04 0434) BP: (131-162)/(48-77) 134/68 mmHg (01/04 0944) SpO2:  [93 %-98 %] 93 % (01/04 0434) Weight:  [136 lb 3.2 oz (61.78 kg)] 136 lb 3.2 oz (61.78 kg) (01/04 0434)  Intake/Output from previous day: 01/03 0701 - 01/04 0700 In: 720 [P.O.:717; I.V.:3] Out: 1300 [Urine:1300] Intake/Output from this shift: Total I/O In: 240 [P.O.:240] Out: -   Physical Exam: Well appearing NAD HEENT: Unremarkable Neck:  No JVD, no thyromegally Lymphatics:  No adenopathy Back:  No CVA tenderness Lungs:  Clear HEART:  Regular rate rhythm, no murmurs, no rubs, no clicks Abd:  Flat, positive bowel sounds, no organomegally, no rebound, no guarding Ext:  2 plus pulses, no edema, no cyanosis, no clubbing Skin:  No rashes no nodules Neuro:  CN II through XII intact, motor grossly intact  Lab Results:  Basename 06/13/12 0555 06/12/12 1901  WBC 9.7 10.9*  HGB 9.8* 9.7*  PLT 248 249    Basename 06/14/12 0630 06/13/12 0555  NA 137 137  K 4.0 4.5  CL 97 99  CO2 20 21  GLUCOSE 131* 74  BUN 78* 73*  CREATININE 4.88* 4.74*    Basename 06/12/12 1901  TROPONINI <0.30   Hepatic Function Panel  Basename 06/13/12 0555  PROT 6.9  ALBUMIN 2.8*  AST 15  ALT 15  ALKPHOS 64  BILITOT 0.2*  BILIDIR --  IBILI --   No results found for this basename: CHOL in the last 72 hours No results found for this basename: PROTIME in the last 72 hours  Imaging: Dg Chest 2 View  06/12/2012  *RADIOLOGY REPORT*  Clinical Data: Short of breath  CHEST - 2 VIEW  Comparison: 06/12/2012  Findings: Mild cardiac enlargement.  Stable appearance of the right pleural effusion with  overlying atelectasis/consolidation.  Smaller left effusion appears similar to previous exam.  Mild multilevel spondylosis is identified within the thoracic spine.  IMPRESSION:  1.  No change from previous exam. 2.  Persistent moderate right pleural effusion and smaller left effusion.   Original Report Authenticated By: Signa Kell, M.D.     Cardiac Studies: Tele - nsr Assessment/Plan:  1. Acute systolic CHF, weight down 7 lbs from admit 2. ESRD, awaiting HD - unclear if she is to be initiated on HD as an inpatient or as an outpatient. I will defer to renal attending 3. DM 4. HTN Rec: She is stable. Continue current medical therapy. Increase activity. Await HD.  LOS: 2 days    Ojani Berenson,M.D. 06/14/2012, 10:02 AM

## 2012-06-14 NOTE — Progress Notes (Signed)
[facility]  Subjective:  Awake, alert, eating lunch, talking with nieces Dialysis to be done today (soon)  Objective: Vital signs in last 24 hours: Blood pressure 134/68, pulse 99, temperature 98.3 F (36.8 C), temperature source Oral, resp. rate 23, height 5\' 5"  (1.651 m), weight 61.78 kg (136 lb 3.2 oz), SpO2 93.00%.    PHYSICAL EXAM General--as above Chest--decreased BS in bases Heart--no rub Abd--nontender Extr--1+ pretib edema  Lab Results:   Lab 06/14/12 0630 06/13/12 1758 06/13/12 0555 06/12/12 1901  NA 137 -- 137 132*  K 4.0 -- 4.5 4.4  CL 97 -- 99 93*  CO2 20 -- 21 21  BUN 78* -- 73* 66*  CREATININE 4.88* -- 4.74* 4.48*  ALB -- -- -- --  GLUCOSE 131* -- -- --  CALCIUM 9.8 -- 9.9 10.9*  PHOS -- 6.7* -- --     Basename 06/13/12 0555 06/12/12 1901  WBC 9.7 10.9*  HGB 9.8* 9.7*  HCT 29.5* 29.9*  PLT 248 249     I have reviewed the patient's current medications. Scheduled:   . amLODipine  5 mg Oral QPM  . atorvastatin  40 mg Oral Daily  . carvedilol  3.125 mg Oral BID WC  . darbepoetin (ARANESP) injection - DIALYSIS  100 mcg Intravenous Q Fri-HD  . feeding supplement (NEPRO CARB STEADY)  237 mL Oral TID WC  . furosemide  80 mg Intravenous Q8H  . heparin  5,000 Units Subcutaneous Q8H  . hydrALAZINE  50 mg Oral TID  . insulin aspart  0-9 Units Subcutaneous TID WC  . multivitamin  1 tablet Oral Daily  . sodium chloride  3 mL Intravenous Q12H   Continuous:   Assessment/Plan: 1.  Acute systolic CHF exacerbation with EF 25%-echo per cardiology revealed EF 25% with diffuse hypokinesis. No suggestion to cycle enzymes although patient may have remotely had cardiac event. Patient did produce 700 cc urine but creatinine trended up on 80mg  Lasix.  *Due to fluid overload refractory to medical management will start HD today *Appreciate Cardiology recommendations-continue beta blocker  2. CKD Stage V--now likely ESRD.  patient with functional RLE  AVF.  *Dialyze today (was supposed to have HD last night, but postponed due to staffing) *lowest weight in office 143, now 137, will need to establish  estimated dry weight  *Hep B negative studies  *Creatinine trending up to 4.88 this AM. No electrolyte abnormalities.  *change to renal diet 80/90-2-2 1200 with carb modified carb controlled.  3. Anemia-% saturation 16% on 12/12 now s/p 2 Ferraheme doses outpatient. No indication to repeat at this time. Hgb stable around baseline of 10. Will start Aranesp.  4. Secondary Hyperparathyroidism-PTH not elevated in December at 40.  D/c pocalcitriol. Begin hectorol 1 mcg each HD 5. Gout-check uric acid 6. HTN-continue beta blocker and hydralazine. Stop spironolactone permanently given now on HD.  *follow closely with HD as may need to d/c hydralazine  7. DM-per primary team  8. Hypoalbuminemia-albumin 2.8. Ordered Nepro TID with meals as patient can tolerate  9. Hyperlipidemia-continue statin    LOS: 2 days   Dajia Gunnels F 06/14/2012,12:10 PM   .labalb

## 2012-06-15 ENCOUNTER — Inpatient Hospital Stay (HOSPITAL_COMMUNITY): Payer: Medicare Other

## 2012-06-15 DIAGNOSIS — E1129 Type 2 diabetes mellitus with other diabetic kidney complication: Secondary | ICD-10-CM

## 2012-06-15 DIAGNOSIS — N058 Unspecified nephritic syndrome with other morphologic changes: Secondary | ICD-10-CM

## 2012-06-15 LAB — RENAL FUNCTION PANEL
Albumin: 2.8 g/dL — ABNORMAL LOW (ref 3.5–5.2)
Calcium: 9.5 mg/dL (ref 8.4–10.5)
GFR calc Af Amer: 8 mL/min — ABNORMAL LOW (ref >90)
Glucose, Bld: 191 mg/dL — ABNORMAL HIGH (ref 70–99)
Phosphorus: 6.9 mg/dL — ABNORMAL HIGH (ref 2.3–4.6)
Potassium: 3.6 mEq/L (ref 3.5–5.1)
Sodium: 131 mEq/L — ABNORMAL LOW (ref 135–145)

## 2012-06-15 LAB — CBC
HCT: 28.6 % — ABNORMAL LOW (ref 36.0–46.0)
Hemoglobin: 9.9 g/dL — ABNORMAL LOW (ref 12.0–15.0)
MCH: 31.6 pg (ref 26.0–34.0)
MCHC: 33.9 g/dL (ref 30.0–36.0)
MCHC: 32.9 g/dL (ref 30.0–36.0)
Platelets: 284 10*3/uL (ref 150–400)
Platelets: 286 10*3/uL (ref 150–400)
RDW: 14.4 % (ref 11.5–15.5)
RDW: 14.3 % (ref 11.5–15.5)
WBC: 9.7 10*3/uL (ref 4.0–10.5)

## 2012-06-15 LAB — BASIC METABOLIC PANEL
BUN: 82 mg/dL — ABNORMAL HIGH (ref 6–23)
Calcium: 9.2 mg/dL (ref 8.4–10.5)
Creatinine, Ser: 5.06 mg/dL — ABNORMAL HIGH (ref 0.50–1.10)
GFR calc Af Amer: 8 mL/min — ABNORMAL LOW (ref >90)
GFR calc non Af Amer: 7 mL/min — ABNORMAL LOW (ref >90)
Glucose, Bld: 216 mg/dL — ABNORMAL HIGH (ref 70–99)
Potassium: 3.5 mEq/L (ref 3.5–5.1)

## 2012-06-15 LAB — GLUCOSE, CAPILLARY
Glucose-Capillary: 104 mg/dL — ABNORMAL HIGH (ref 70–99)
Glucose-Capillary: 182 mg/dL — ABNORMAL HIGH (ref 70–99)

## 2012-06-15 MED ORDER — FENTANYL CITRATE 0.05 MG/ML IJ SOLN
INTRAMUSCULAR | Status: AC | PRN
  Administered 2012-06-15: 25 ug via INTRAVENOUS

## 2012-06-15 MED ORDER — LORAZEPAM 2 MG/ML IJ SOLN
1.0000 mg | Freq: Once | INTRAMUSCULAR | Status: AC
  Administered 2012-06-15: 1 mg via INTRAVENOUS

## 2012-06-15 MED ORDER — GELATIN ABSORBABLE 12-7 MM EX MISC
CUTANEOUS | Status: AC
  Filled 2012-06-15: qty 1

## 2012-06-15 MED ORDER — ALPRAZOLAM 0.5 MG PO TABS
0.5000 mg | ORAL_TABLET | Freq: Three times a day (TID) | ORAL | Status: DC | PRN
  Administered 2012-06-15: 0.5 mg via ORAL

## 2012-06-15 MED ORDER — MIDAZOLAM HCL 2 MG/2ML IJ SOLN
INTRAMUSCULAR | Status: AC | PRN
  Administered 2012-06-15: 1 mg via INTRAVENOUS

## 2012-06-15 MED ORDER — LORAZEPAM 2 MG/ML IJ SOLN
INTRAMUSCULAR | Status: AC
  Filled 2012-06-15: qty 1

## 2012-06-15 MED ORDER — RENA-VITE PO TABS
1.0000 | ORAL_TABLET | Freq: Every day | ORAL | Status: DC
  Filled 2012-06-15: qty 1

## 2012-06-15 MED ORDER — ALPRAZOLAM 0.5 MG PO TABS
ORAL_TABLET | ORAL | Status: AC
  Filled 2012-06-15: qty 1

## 2012-06-15 NOTE — Progress Notes (Signed)
[facility] PROGRESS NOTE  Kimberly Norman AVW:[id number] DOB: Mar 10, 1919 DOA: 06/12/2012 PCP: [name], MD  Assessment/Plan: 1. Principal Problem: 2.  *new onset CHF (NYHA class II, ACC/AHA stage C): New diagnosis.  Has had some effectiveness with diuresis his weight is down 11 pounds from admission. Echocardiogram notes global hypokinesis with a significantly decreased ejection fraction of 25% as well as grade 1 diastolic dysfunction moderate pulmonary hypertension. Appreciate cardiology consult. See below for plans for dialysis 3. Active Problems: 4.  ESRD (end stage renal disease): Appreciate nephrology consult. Attempts were made to dialyze patient yesterday but problems with infiltration. Nephrology to evaluate IV access to 5.  DM (diabetes mellitus): Stable. CBGs ranging from 80s to 140s 6.  Hyponatremia: Resolved with diuresis 7.  Hyperlipidemia 8.  Gout: Stable. Watch what she is being diuresed 9.  HTN (hypertension): Blood pressure stable with a systolic in the 130s 10. Anemia of chronic disease: Stable  Code Status: Full code Family Communication: Discussed with niece at the bedside Disposition Plan: Likely be here for a few more days   Consultants:  [facility] cardiology  [facility]  Procedures:  Echocardiogram done-global hypokinesis. Grade 1 diastolic dysfunction. Ejection fraction 25%. Pulmonary hypertension-moderate  Antibiotics:  None  HPI/Subjective: Still not sleeping well at all. I'm happy about unable to get dialysis yesterday. Breathing a little bit easier.  Objective: Filed Vitals:   06/14/12 1410 06/14/12 2030 06/14/12 2100 06/15/12 0441  BP: 145/66  143/70 125/55  Pulse: 87 93 66 85  Temp: 96.8 F (36 C)  97.7 F (36.5 C) 98.5 F (36.9 C)  TempSrc: Oral   Oral  Resp: 29  18 16   Height:      Weight: 63.3 kg (139 lb 8.8 oz)   59.966 kg (132 lb 3.2 oz)  SpO2: 96%  96% 93%    Intake/Output Summary (Last 24 hours) at 06/15/12  0915 Last data filed at 06/15/12 0852  Gross per 24 hour  Intake    483 ml  Output    800 ml  Net   -317 ml   Filed Weights   06/14/12 0434 06/14/12 1410 06/15/12 0441  Weight: 61.78 kg (136 lb 3.2 oz) 63.3 kg (139 lb 8.8 oz) 59.966 kg (132 lb 3.2 oz)    Exam:   General:  Alert and oriented x3, mild distress secondary to back pain discomfort.  Cardiovascular: Regular rate and rhythm, S1-S2, 2/6 systolic ejection murmur  Respiratory: Better airway flow today. Minimal decreased breath sounds at the bases   Abdomen: Soft, nontender, nondistended, positive bowel sounds  Extremities: No clubbing or cyanosis, trace pitting edema  Data Reviewed: Basic Metabolic Panel:  Lab 06/14/12 8295 06/13/12 1758 06/13/12 0555 06/12/12 1901  NA 137 -- 137 132*  K 4.0 -- 4.5 4.4  CL 97 -- 99 93*  CO2 20 -- 21 21  GLUCOSE 131* -- 74 148*  BUN 78* -- 73* 66*  CREATININE 4.88* -- 4.74* 4.48*  CALCIUM 9.8 -- 9.9 10.9*  MG -- -- -- --  PHOS -- 6.7* -- --   Liver Function Tests:  Lab 06/13/12 0555  AST 15  ALT 15  ALKPHOS 64  BILITOT 0.2*  PROT 6.9  ALBUMIN 2.8*   CBC:  Lab 06/13/12 0555 06/12/12 1901  WBC 9.7 10.9*  NEUTROABS -- 9.0*  HGB 9.8* 9.7*  HCT 29.5* 29.9*  MCV 94.6 94.3  PLT 248 249   Cardiac Enzymes:  Lab 06/12/12 1901  CKTOTAL --  CKMB --  CKMBINDEX --  TROPONINI <0.30   BNP (last 3 results)  Basename 06/12/12 1901  PROBNP 30474.0*   CBG:  Lab 06/15/12 0640 06/14/12 2056 06/14/12 1600 06/14/12 0632 06/14/12 0450  GLUCAP 104* 154* 139* 124* 127*      Studies: Dg Chest 2 View  06/12/2012   IMPRESSION:  1.  No change from previous exam. 2.  Persistent moderate right pleural effusion and smaller left effusion.   Original Report Authenticated By: Signa Kell, M.D.     Scheduled Meds:    . amLODipine  5 mg Oral QPM  . atorvastatin  40 mg Oral Daily  . carvedilol  3.125 mg Oral BID WC  . darbepoetin (ARANESP) injection - DIALYSIS  100 mcg  Intravenous Q Fri-HD  . doxercalciferol  1 mcg Intravenous Q T,Th,Sa-HD  . feeding supplement (NEPRO CARB STEADY)  237 mL Oral TID WC  . furosemide  80 mg Intravenous Q8H  . heparin  5,000 Units Subcutaneous Q8H  . hydrALAZINE  50 mg Oral TID  . insulin aspart  0-9 Units Subcutaneous TID WC  . multivitamin  1 tablet Oral Daily  . sodium chloride  3 mL Intravenous Q12H   Continuous Infusions:   Principal Problem:  *new onset CHF (NYHA class II, ACC/AHA stage C) Active Problems:  ESRD (end stage renal disease)  DM (diabetes mellitus), type 2 with renal complications  Hyponatremia  Hyperlipidemia  Gout  HTN (hypertension)    Time spent: 25 minutes    Hollice Espy  [facility] Pager [phone]. If 8PM-8AM, please contact night-coverage at [email or web address], password [facility] 06/15/2012, 9:15 AM  LOS: 3 days

## 2012-06-15 NOTE — Progress Notes (Signed)
[facility]  Subjective:  Awake, alert, learning over pillow on side of bed.  Per RN, AVF infiltrated at attempt of HD yesterday.  Patient very frustrated/angry about not having HD yet and wants to know what the plan for today is-told patient Dr. Caryn Section would be to see her soon Weight down from 64.8 kg at time of admission to 59.966 kg this AM. Patient essentially net even I/O since admit at -177 with -170 yesterday.   Objective: Vital signs in last 24 hours: Blood pressure 125/55, pulse 85, temperature 98.5 F (36.9 C), temperature source Oral, resp. rate 16, height 5\' 5"  (1.651 m), weight 132 lb 3.2 oz (59.966 kg), SpO2 93.00%.    PHYSICAL EXAM General--as above Chest--Clear and equal-do not hear diminished sounds at bases. Unlaobored Heart--no rub Abd--nontender Extr--1+ pretib edema, RLE AVF patent but with ecchymosis noted around it. No pain.   Lab Results:   Lab 06/14/12 0630 06/13/12 1758 06/13/12 0555 06/12/12 1901  NA 137 -- 137 132*  K 4.0 -- 4.5 4.4  CL 97 -- 99 93*  CO2 20 -- 21 21  BUN 78* -- 73* 66*  CREATININE 4.88* -- 4.74* 4.48*  ALB -- -- -- --  GLUCOSE 131* -- -- --  CALCIUM 9.8 -- 9.9 10.9*  PHOS -- 6.7* -- --     Basename 06/13/12 0555 06/12/12 1901  WBC 9.7 10.9*  HGB 9.8* 9.7*  HCT 29.5* 29.9*  PLT 248 249     I have reviewed the patient's current medications. Scheduled:    . amLODipine  5 mg Oral QPM  . atorvastatin  40 mg Oral Daily  . carvedilol  3.125 mg Oral BID WC  . darbepoetin (ARANESP) injection - DIALYSIS  100 mcg Intravenous Q Fri-HD  . doxercalciferol  1 mcg Intravenous Q T,Th,Sa-HD  . feeding supplement (NEPRO CARB STEADY)  237 mL Oral TID WC  . furosemide  80 mg Intravenous Q8H  . heparin  5,000 Units Subcutaneous Q8H  . hydrALAZINE  50 mg Oral TID  . insulin aspart  0-9 Units Subcutaneous TID WC  . multivitamin  1 tablet Oral Daily  . sodium chloride  3 mL Intravenous Q12H   Continuous:    Assessment/Plan: 1.  Acute systolic CHF exacerbation with EF 25%-echo per cardiology revealed EF 25% with diffuse hypokinesis. No suggestion to cycle enzymes although patient may have remotely had cardiac event. Patient did produce 700 cc urine but creatinine trended up on 80mg  Lasix.  *Due to fluid overload refractory to medical management plan is for HD. Will need to discuss with Dr. Caryn Section usability of AVF vs need to obtain tunnel HD cath.  *Appreciate Cardiology recommendations-continue beta blocker  2. CKD Stage V-now likely ESRD. See #1. Plan was for dialysis 1/4 but postponed due to staffing. 1/5 postponed due to AVF infiltration *no labs ordered today-will order bmet and CBC now as unclear if will have HD today. In addition, weight trending down and patient's exam is improved from respiratory perspective. Cr 4.88 yesterday.  *lowest weight in office 143, now 132.   *Hep B negative studies  *renal diet 80/90-2-2 1200  3. Anemia-% saturation 16% on 12/12 now s/p 2 Ferraheme doses outpatient. No indication to repeat at this time. Hgb stable around baseline of 10. Will start Aranesp.  4. Secondary Hyperparathyroidism-PTH not elevated in December at 40.  D/c po calcitriol. Begin hectorol 1 mcg each HD 5. Gout-uric acid 6.8. .  6. HTN-controlled. continue beta blocker and  hydralazine. Stop spironolactone permanently given now on HD.  *follow closely with HD as may need to d/c hydralazine  7. DM-per primary team  8. Hypoalbuminemia-albumin 2.8. Ordered Nepro TID with meals as patient can tolerate. Consider repeat albumin within a week.  9. Hyperlipidemia-continue statin    LOS: 3 days   Aldine Contes. Marti Sleigh, MD, PGY2 06/15/2012 9:05 AM I have seen and examined this patient and agree with plan.  IAVF "blew" yesterday and dialysis could not proceed.  Spoke with Dr. Cassell Clement will place tunneled HD cath today.  Will check  shuntogram in a couple of days. Kimari Coudriet F,MD 06/15/2012 11:11 AM

## 2012-06-15 NOTE — Procedures (Signed)
Placement of right IJ HemoSplit Catheter for dialysis.  Tip at SVC/RA junction.  Catheter is ready for use.  No immediate complication.

## 2012-06-15 NOTE — Consult Note (Signed)
Reason for Consult: Placement of dialysis catheter Referring Physician: Caryn Section (Nephrology)  Kimberly Norman is an 79 y.o. female.  HPI: CHF and ESRD.  Patient has a right arm fistula which infiltrated during access.  The fistula has never successfully been used for dialysis.  Patient needs hemodialysis and nephrology would like to avoid using the fistula until after a fistulogram and resolution of the ecchymosis.  No complaints except for difficulty breathing when lying flat.  Past Medical History  Diagnosis Date  . Hyperlipidemia   . Hypertension   . Diabetes mellitus Age 65    using insulin  . Chronic kidney disease     ESRD. Kidney dz since ~2008. s/p fistula placement.. Low office weight 143.   Marland Kitchen Arthritis   . Osteoporosis   . Cataract   . Constipation 10/26/10  . Gout   . Renal artery stenosis     Angiogram 2006 shoed L RAS  60% and 30% stenosis in R renal artery.    . Anemia     Receives iron infusions per nephrology    Past Surgical History  Procedure Date  . Renal angiogram 2002  . Cataract extraction, bilateral   . Hand surgery     right  . Av fistula placement 01/10/2011    Right radiocephalic AVF    Family History  Problem Relation Age of Onset  . Cancer Mother 11    pancreatic cancer  . Hypertension Mother   . Heart disease Father     presumed. died at age 32    Social History:  reports that she quit smoking about 31 years ago. Her smoking use included Cigarettes. She quit after 17 years of use. She does not have any smokeless tobacco history on file. She reports that she does not drink alcohol or use illicit drugs.  Allergies: No Known Allergies  Current facility-administered medications:0.9 %  sodium chloride infusion, 100 mL, Intravenous, PRN, Zada Girt, MD;  0.9 %  sodium chloride infusion, 100 mL, Intravenous, PRN, Zada Girt, MD;  acetaminophen (TYLENOL) tablet 650 mg, 650 mg, Oral, Q4H PRN, Roma Kayser Schorr, NP, 650 mg at 06/15/12 0134;  albuterol  (PROVENTIL HFA;VENTOLIN HFA) 108 (90 BASE) MCG/ACT inhaler 2 puff, 2 puff, Inhalation, Q4H PRN, Hollice Espy, MD amLODipine (NORVASC) tablet 5 mg, 5 mg, Oral, QPM, Rhetta Mura, MD, 5 mg at 06/14/12 1747;  atorvastatin (LIPITOR) tablet 40 mg, 40 mg, Oral, Daily, Rhetta Mura, MD, 40 mg at 06/15/12 1001;  carvedilol (COREG) tablet 3.125 mg, 3.125 mg, Oral, BID WC, Rhetta Mura, MD, 3.125 mg at 06/15/12 0707;  darbepoetin (ARANESP) injection 100 mcg, 100 mcg, Intravenous, Q Fri-HD, Zada Girt, MD doxercalciferol (HECTOROL) injection 1 mcg, 1 mcg, Intravenous, Q T,Th,Sa-HD, Zada Girt, MD;  feeding supplement (NEPRO CARB STEADY) liquid 237 mL, 237 mL, Oral, PRN, Zada Girt, MD;  feeding supplement (NEPRO CARB STEADY) liquid 237 mL, 237 mL, Oral, TID WC, Shelva Majestic, MD, 237 mL at 06/14/12 0645;  furosemide (LASIX) injection 80 mg, 80 mg, Intravenous, Q8H, Rhetta Mura, MD, 80 mg at 06/15/12 0708 heparin injection 1,000 Units, 1,000 Units, Dialysis, PRN, Zada Girt, MD;  heparin injection 1,200 Units, 20 Units/kg, Dialysis, PRN, Zada Girt, MD;  heparin injection 5,000 Units, 5,000 Units, Subcutaneous, Q8H, Rhetta Mura, MD, 5,000 Units at 06/15/12 0712;  hydrALAZINE (APRESOLINE) tablet 50 mg, 50 mg, Oral, TID, Rhetta Mura, MD, 50 mg at 06/15/12 1001 insulin aspart (novoLOG) injection 0-9 Units, 0-9  Units, Subcutaneous, TID WC, Rhetta Mura, MD, 1 Units at 06/14/12 1749;  lidocaine (XYLOCAINE) 1 % injection 5 mL, 5 mL, Intradermal, PRN, Zada Girt, MD;  lidocaine-prilocaine (EMLA) cream 1 application, 1 application, Topical, PRN, Zada Girt, MD;  LORazepam (ATIVAN) tablet 0.5 mg, 0.5 mg, Oral, Daily PRN, Rhetta Mura, MD, 0.5 mg at 06/13/12 2230 multivitamin (RENA-VIT) tablet 1 tablet, 1 tablet, Oral, Daily, Zada Girt, MD, 1 tablet at 06/15/12 1001;  pentafluoroprop-tetrafluoroeth (GEBAUERS) aerosol 1 application, 1  application, Topical, PRN, Zada Girt, MD;  sodium chloride 0.9 % injection 3 mL, 3 mL, Intravenous, Q12H, Rhetta Mura, MD, 3 mL at 06/15/12 0708   Results for orders placed during the hospital encounter of 06/12/12 (from the past 48 hour(s))  GLUCOSE, CAPILLARY     Status: Abnormal   Collection Time   06/13/12 12:45 PM      Component Value Range Comment   Glucose-Capillary 161 (*) 70 - 99 mg/dL   GLUCOSE, CAPILLARY     Status: Abnormal   Collection Time   06/13/12  4:22 PM      Component Value Range Comment   Glucose-Capillary 144 (*) 70 - 99 mg/dL    Comment 1 Notify RN     PHOSPHORUS     Status: Abnormal   Collection Time   06/13/12  5:58 PM      Component Value Range Comment   Phosphorus 6.7 (*) 2.3 - 4.6 mg/dL   GLUCOSE, CAPILLARY     Status: Abnormal   Collection Time   06/13/12  9:11 PM      Component Value Range Comment   Glucose-Capillary 145 (*) 70 - 99 mg/dL   GLUCOSE, CAPILLARY     Status: Abnormal   Collection Time   06/14/12  4:50 AM      Component Value Range Comment   Glucose-Capillary 127 (*) 70 - 99 mg/dL   BASIC METABOLIC PANEL     Status: Abnormal   Collection Time   06/14/12  6:30 AM      Component Value Range Comment   Sodium 137  135 - 145 mEq/L    Potassium 4.0  3.5 - 5.1 mEq/L    Chloride 97  96 - 112 mEq/L    CO2 20  19 - 32 mEq/L    Glucose, Bld 131 (*) 70 - 99 mg/dL    BUN 78 (*) 6 - 23 mg/dL    Creatinine, Ser 1.61 (*) 0.50 - 1.10 mg/dL    Calcium 9.8  8.4 - 09.6 mg/dL    GFR calc non Af Amer 7 (*) >90 mL/min    GFR calc Af Amer 8 (*) >90 mL/min   URIC ACID     Status: Normal   Collection Time   06/14/12  6:30 AM      Component Value Range Comment   Uric Acid, Serum 6.8  2.4 - 7.0 mg/dL   GLUCOSE, CAPILLARY     Status: Abnormal   Collection Time   06/14/12  6:32 AM      Component Value Range Comment   Glucose-Capillary 124 (*) 70 - 99 mg/dL   GLUCOSE, CAPILLARY     Status: Abnormal   Collection Time   06/14/12  4:00 PM      Component  Value Range Comment   Glucose-Capillary 139 (*) 70 - 99 mg/dL    Comment 1 Documented in Chart      Comment 2 Notify RN     GLUCOSE,  CAPILLARY     Status: Abnormal   Collection Time   06/14/12  8:56 PM      Component Value Range Comment   Glucose-Capillary 154 (*) 70 - 99 mg/dL   GLUCOSE, CAPILLARY     Status: Abnormal   Collection Time   06/15/12  6:40 AM      Component Value Range Comment   Glucose-Capillary 104 (*) 70 - 99 mg/dL    Comment 1 Documented in Chart      Comment 2 Notify RN     BASIC METABOLIC PANEL     Status: Abnormal   Collection Time   06/15/12  9:58 AM      Component Value Range Comment   Sodium 135  135 - 145 mEq/L    Potassium 3.5  3.5 - 5.1 mEq/L    Chloride 96  96 - 112 mEq/L    CO2 23  19 - 32 mEq/L    Glucose, Bld 216 (*) 70 - 99 mg/dL    BUN 82 (*) 6 - 23 mg/dL    Creatinine, Ser 4.09 (*) 0.50 - 1.10 mg/dL    Calcium 9.2  8.4 - 81.1 mg/dL    GFR calc non Af Amer 7 (*) >90 mL/min    GFR calc Af Amer 8 (*) >90 mL/min   CBC     Status: Abnormal   Collection Time   06/15/12  9:58 AM      Component Value Range Comment   WBC 9.3  4.0 - 10.5 K/uL    RBC 3.13 (*) 3.87 - 5.11 MIL/uL    Hemoglobin 9.9 (*) 12.0 - 15.0 g/dL    HCT 91.4 (*) 78.2 - 46.0 %    MCV 93.3  78.0 - 100.0 fL    MCH 31.6  26.0 - 34.0 pg    MCHC 33.9  30.0 - 36.0 g/dL    RDW 95.6  21.3 - 08.6 %    Platelets 284  150 - 400 K/uL   GLUCOSE, CAPILLARY     Status: Abnormal   Collection Time   06/15/12 10:51 AM      Component Value Range Comment   Glucose-Capillary 182 (*) 70 - 99 mg/dL    Comment 1 Notify RN        Review of Systems  Respiratory: Positive for shortness of breath.      Physical Exam  Cardiovascular: Normal rate and regular rhythm.   Murmur heard. Respiratory: Effort normal.  Slightly decreased breath sounds on the right side.   Right forearm fistula is patent with ecchymosis.  Assessment/Plan: 79 yo with CHF and ESRD. Patient needs hemodialysis and unable to use  right arm fistula.  Discussed a tunneled dialysis catheter with the patient and family.  Patient understands the risks and benefits of the patient.  Slightly increased risk of bleeding due to heparin injection this morning.  Informed consent obtained.  Will hold lunch and plan to place catheter this afternoon with moderate sedation.    Jese Comella RYAN 06/15/2012, 11:59 AM

## 2012-06-16 LAB — CBC
HCT: 30.5 % — ABNORMAL LOW (ref 36.0–46.0)
MCHC: 33.1 g/dL (ref 30.0–36.0)
Platelets: 283 10*3/uL (ref 150–400)
RDW: 14.6 % (ref 11.5–15.5)
WBC: 8.1 10*3/uL (ref 4.0–10.5)

## 2012-06-16 LAB — GLUCOSE, CAPILLARY
Glucose-Capillary: 115 mg/dL — ABNORMAL HIGH (ref 70–99)
Glucose-Capillary: 185 mg/dL — ABNORMAL HIGH (ref 70–99)
Glucose-Capillary: 198 mg/dL — ABNORMAL HIGH (ref 70–99)

## 2012-06-16 LAB — BASIC METABOLIC PANEL
BUN: 50 mg/dL — ABNORMAL HIGH (ref 6–23)
Calcium: 9.3 mg/dL (ref 8.4–10.5)
Creatinine, Ser: 3.49 mg/dL — ABNORMAL HIGH (ref 0.50–1.10)
GFR calc Af Amer: 12 mL/min — ABNORMAL LOW (ref >90)
GFR calc non Af Amer: 11 mL/min — ABNORMAL LOW (ref >90)

## 2012-06-16 MED ORDER — PENTAFLUOROPROP-TETRAFLUOROETH EX AERO: 1.0000 "application " | INHALATION_SPRAY | CUTANEOUS | Status: DC | PRN

## 2012-06-16 MED ORDER — LIDOCAINE-PRILOCAINE 2.5-2.5 % EX CREA: 1.0000 "application " | TOPICAL_CREAM | CUTANEOUS | Status: DC | PRN

## 2012-06-16 MED ORDER — LORAZEPAM 2 MG/ML IJ SOLN
0.5000 mg | INTRAMUSCULAR | Status: DC | PRN
  Administered 2012-06-16: 0.5 mg via INTRAVENOUS
  Filled 2012-06-16: qty 1

## 2012-06-16 MED ORDER — DOCUSATE SODIUM 100 MG PO CAPS
100.0000 mg | ORAL_CAPSULE | Freq: Two times a day (BID) | ORAL | Status: DC | PRN
  Administered 2012-06-17: 100 mg via ORAL
  Filled 2012-06-16 (×2): qty 1

## 2012-06-16 MED ORDER — ALTEPLASE 2 MG IJ SOLR: 2.0000 mg | Freq: Once | INTRAMUSCULAR | Status: DC | PRN

## 2012-06-16 MED ORDER — HEPARIN SODIUM (PORCINE) 1000 UNIT/ML DIALYSIS: 20.0000 [IU]/kg | INTRAMUSCULAR | Status: DC | PRN

## 2012-06-16 MED ORDER — SODIUM CHLORIDE 0.9 % IV SOLN: 100.0000 mL | INTRAVENOUS | Status: DC | PRN

## 2012-06-16 MED ORDER — POLYETHYLENE GLYCOL 3350 17 G PO PACK
17.0000 g | PACK | Freq: Once | ORAL | Status: AC
  Administered 2012-06-16: 17 g via ORAL
  Filled 2012-06-16: qty 1

## 2012-06-16 MED ORDER — LIDOCAINE HCL (PF) 1 % IJ SOLN: 5.0000 mL | INTRAMUSCULAR | Status: DC | PRN

## 2012-06-16 MED ORDER — NEPRO/CARBSTEADY PO LIQD: 237.0000 mL | ORAL | Status: DC | PRN

## 2012-06-16 MED ORDER — HEPARIN SODIUM (PORCINE) 1000 UNIT/ML DIALYSIS: 1000.0000 [IU] | INTRAMUSCULAR | Status: DC | PRN

## 2012-06-16 MED ORDER — CARVEDILOL 6.25 MG PO TABS
6.2500 mg | ORAL_TABLET | Freq: Two times a day (BID) | ORAL | Status: DC
  Administered 2012-06-16 – 2012-06-18 (×5): 6.25 mg via ORAL
  Filled 2012-06-16 (×9): qty 1

## 2012-06-16 MED ORDER — ONDANSETRON HCL 4 MG/2ML IJ SOLN
4.0000 mg | Freq: Four times a day (QID) | INTRAMUSCULAR | Status: DC | PRN
Start: 2012-06-16 — End: 2012-06-19
  Filled 2012-06-16: qty 2

## 2012-06-16 MED FILL — Heparin Sodium (Porcine) Inj 1000 Unit/ML: INTRAMUSCULAR | Qty: 10 | Status: AC

## 2012-06-16 MED FILL — Cefazolin in D5W Inj 1 GM/50ML: INTRAVENOUS | Qty: 50 | Status: AC

## 2012-06-16 NOTE — Progress Notes (Signed)
[facility]  Subjective:  Awake, alert, resting in chair. HD last night with plan for HD today.  Multiple concerns including bed too soft, no anxiolytic before dialysis, no prep for what dialysis would be like, fear during HD but states session went well.   Objective: Vital signs in last 24 hours: Blood pressure 135/68, pulse 73, temperature 98 F (36.7 C), temperature source Oral, resp. rate 20, height 5\' 5"  (1.651 m), weight 134 lb 0.6 oz (60.8 kg), SpO2 96.00%.    PHYSICAL EXAM General--as above Chest--Clear and equal.  Unlaobored Heart--no rub Abd--nontender Extr--1+ pretib edema, RLE AVF patent but with ecchymosis noted around it. No pain.    Intake/Output Summary (Last 24 hours) at 06/16/12 1232 Last data filed at 06/16/12 0900  Gross per 24 hour  Intake    460 ml  Output   3025 ml  Net  -2565 ml  675 urine  Weight change: -3.5 oz (-0.1 kg)    Lab Results:   Lab 06/16/12 0655 06/15/12 2107 06/15/12 0958 06/13/12 1758  NA 139 131* 135 --  K 3.3* 3.6 3.5 --  CL 97 92* 96 --  CO2 22 21 23  --  BUN 50* 82* 82* --  CREATININE 3.49* 4.91* 5.06* --  ALB -- -- -- --  GLUCOSE 173* -- -- --  CALCIUM 9.3 9.5 9.2 --  PHOS -- 6.9* -- 6.7*     Basename 06/16/12 0655 06/15/12 2107  WBC 8.1 9.7  HGB 10.1* 9.4*  HCT 30.5* 28.6*  PLT 283 286     I have reviewed the patient's current medications. Scheduled:    . amLODipine  5 mg Oral QPM  . atorvastatin  40 mg Oral Daily  . carvedilol  6.25 mg Oral BID WC  . darbepoetin (ARANESP) injection - DIALYSIS  100 mcg Intravenous Q Fri-HD  . doxercalciferol  1 mcg Intravenous Q T,Th,Sa-HD  . feeding supplement (NEPRO CARB STEADY)  237 mL Oral TID WC  . heparin  5,000 Units Subcutaneous Q8H  . insulin aspart  0-9 Units Subcutaneous TID WC  . multivitamin  1 tablet Oral Daily  . sodium chloride  3 mL Intravenous Q12H    Assessment/Plan: 1.  Acute systolic CHF exacerbation with EF 25% refractory to  medical management now requiring HD.  *improving. Will continue HD for plan of 3 consecutive days including today.  *stopped lasix. *Appreciate Cardiology recommendations-continue beta blocker  2. CKD Stage V-now likely ESRD. Received dialysis 1/6. For patient comfort will schedule ativan before dialysis. 676 UOP noted yesterday. Labs stable except slight hypokalemia-will be corrected in HD.  *s/p tunnel HD cath. Will need planning for long term access.   3. Anemia-% saturation 16% on 12/12 now s/p 2 Ferraheme doses outpatient. No indication to repeat at this time. Hgb stable around baseline of 10. Will start Aranesp.  4. Secondary Hyperparathyroidism-PTH not elevated in December at 40.  Hectorol 1 mcg each HD 5. Gout-uric acid 6.8. Per primary team would consider low dose allopurinol such as 50mg  daily or 100mg  postdialysis on HD days.  6. HTN-controlled. continue beta blocker . Stopped spironolactone permanently given now on HD.  *d/c hydralazine   Other medical problems per primary team. 7. DM-per primary team  8. Hypoalbuminemia-albumin 2.8. Ordered Nepro TID with meals as patient can tolerate. Consider repeat albumin within a week.  9. Hyperlipidemia-continue statin    LOS: 4 days   Aldine Contes. Marti Sleigh, MD, PGY2 06/16/2012 12:32 PM

## 2012-06-16 NOTE — Progress Notes (Signed)
1//08/2011 monitor tech called patient was doing a fib on telemetry at 0842.Telemetry strip was shown to  Cardiology by Rn. Per cardiology patient was doing sinus rhythm with PAC's. Kimberly Grismer Rn.

## 2012-06-16 NOTE — Progress Notes (Signed)
Pt reports having SOB without exertion at bedside. VS are 94% on 2L nasal cannula, BP 130/60 pulse 83, respirations at 22. Will continue to monitor.

## 2012-06-16 NOTE — Progress Notes (Signed)
Subjective:  The patient had her first hemodialysis last evening.  She is complaining today that she didn't receive her sedation preoperatively early enough. She has not had any chest pain or increased shortness of breath. Her weight is down 9 pounds since admission.  Objective:  Vital Signs in the last 24 hours: Temp:  [97.3 F (36.3 C)-98.2 F (36.8 C)] 97.3 F (36.3 C) (01/06 0636) Pulse Rate:  [57-103] 92  (01/06 0636) Resp:  [20-37] 20  (01/06 0636) BP: (116-157)/(34-94) 134/63 mmHg (01/06 0636) SpO2:  [91 %-98 %] 94 % (01/06 0636) Weight:  [134 lb 0.6 oz (60.8 kg)-139 lb 5.3 oz (63.2 kg)] 134 lb 0.6 oz (60.8 kg) (01/05 2345)  Intake/Output from previous day: 01/05 0701 - 01/06 0700 In: 463 [P.O.:460; I.V.:3] Out: 3175 [Urine:675] Intake/Output from this shift:       . ALPRAZolam      . amLODipine  5 mg Oral QPM  . atorvastatin  40 mg Oral Daily  . carvedilol  3.125 mg Oral BID WC  . darbepoetin (ARANESP) injection - DIALYSIS  100 mcg Intravenous Q Fri-HD  . doxercalciferol  1 mcg Intravenous Q T,Th,Sa-HD  . feeding supplement (NEPRO CARB STEADY)  237 mL Oral TID WC  . furosemide  80 mg Intravenous Q8H  . heparin  5,000 Units Subcutaneous Q8H  . hydrALAZINE  50 mg Oral TID  . insulin aspart  0-9 Units Subcutaneous TID WC  . LORazepam      . multivitamin  1 tablet Oral Daily  . sodium chloride  3 mL Intravenous Q12H      Physical Exam: The patient appears to be in no distress. General: Well developed, well nourished elderly WF in no acute distress.  Head: Normocephalic, atraumatic, sclera non-icteric, no xanthomas, nares are without discharge.  Neck: Negative for carotid bruits. JVD not elevated.  Lungs: Very decreased BS R 1/3 way up, decreased at bases on L. No wheezes, rales, or rhonchi. Breathing is unlabored.  Heart: Regular rhythm with occasional ectopy, controlled rate, with S1 S2. No murmurs, rubs, or gallops appreciated.  Abdomen: Soft, non-tender,  non-distended with normoactive bowel sounds. No hepatomegaly. No rebound/guarding. No obvious abdominal masses.  Msk: Strength and tone appear normal for age.  Extremities: No clubbing or cyanosis. Tr edema accentuated by sock lines. No erythema. Distal pedal pulses are 2+ and equal bilaterally.  Neuro: Alert and oriented X 3. No facial asymmetry. No focal deficit. Moves all extremities spontaneously.  Psych: Responds to questions appropriately with a normal affect.  Complaining today about her hospital bed being so uncomfortable.    Lab Results:  Basename 06/16/12 0655 06/15/12 2107  WBC 8.1 9.7  HGB 10.1* 9.4*  PLT 283 286    Basename 06/16/12 0655 06/15/12 2107  NA 139 131*  K 3.3* 3.6  CL 97 92*  CO2 22 21  GLUCOSE 173* 191*  BUN 50* 82*  CREATININE 3.49* 4.91*   No results found for this basename: TROPONINI:2,CK,MB:2 in the last 72 hours Hepatic Function Panel  Basename 06/15/12 2107  PROT --  ALBUMIN 2.8*  AST --  ALT --  ALKPHOS --  BILITOT --  BILIDIR --  IBILI --   No results found for this basename: CHOL in the last 72 hours No results found for this basename: PROTIME in the last 72 hours  Imaging: Imaging results have been reviewed  Cardiac Studies: Telemetry shows normal sinus rhythm with frequent premature atrial beats Assessment/Plan:  Patient Active Hospital  Problem List: new onset CHF (NYHA class II, ACC/AHA stage C) (06/12/2012)   Assessment: Improved since hemodialysis.  Weight is down 9 pounds    Plan: Continue amlodipine carvedilol and hydralazine.  Increase carvedilol up to 6.25 mg twice a day   HTN (hypertension) (06/12/2012)   Assessment: Satisfactory control on current    Plan: Continue present medication.  Now that she is on hemodialysis I will defer dosing of her Lasix to nephrology.   LOS: 4 days    Cassell Clement 06/16/2012, 9:08 AM

## 2012-06-16 NOTE — Progress Notes (Signed)
I have seen and examined this patient and agree with the plan of care  .  Kimberly Norman W 06/16/2012, 9:00 PM

## 2012-06-16 NOTE — Progress Notes (Signed)
[facility] PROGRESS NOTE  Kimberly Norman ZOX:[id number] DOB: 21-Mar-1919 DOA: 06/12/2012 PCP: [name], MD  Assessment/Plan: 1. Principal Problem: 2.  *new onset CHF (NYHA class II, ACC/AHA stage C): New diagnosis.  Has had some effectiveness with diuresis, and now even more effective with dialysis-weight is down 9 pounds and she is minus over 3 L Echocardiogram notes global hypokinesis with a significantly decreased ejection fraction of 25% as well as grade 1 diastolic dysfunction moderate pulmonary hypertension. Appreciate cardiology consult. Plan will be to dialyze for 3 days total including yesterday. 3. Active Problems: 4.  ESRD (end stage renal disease): Appreciate nephrology consult. Patient successfully dialyzed yesterday, today and tomorrow also 5.  DM (diabetes mellitus): Stable. CBGs starting to trend up into the 180s given her increasing appetite 6.  Hyponatremia: Resolved with diuresis 7.  Hyperlipidemia 8.  Gout: Stable. Watch what she is being diuresed 9.  HTN (hypertension): Blood pressure stable with a systolic in the 130s 10. Anemia of chronic disease: Stable  Code Status: Full code Family Communication: Discussed with niece at the bedside Disposition Plan: Likely be here for a few more days   Consultants:  [facility] cardiology  [facility]  Procedures:  Echocardiogram done-global hypokinesis. Grade 1 diastolic dysfunction. Ejection fraction 25%. Pulmonary hypertension-moderate  Antibiotics:  None  HPI/Subjective: Patient transferred to 6700. Slightly upset about not being sedated properly before dialysis. Breathing little bit easier.  Objective: Filed Vitals:   06/15/12 2345 06/16/12 0021 06/16/12 0636 06/16/12 1000  BP: 148/48 131/69 134/63 135/68  Pulse: 91 103 92 73  Temp:  97.9 F (36.6 C) 97.3 F (36.3 C) 98 F (36.7 C)  TempSrc: Oral Oral Oral Oral  Resp: 27 22 20 20   Height:      Weight: 60.8 kg (134 lb 0.6 oz)     SpO2:  92% 96% 94% 96%    Intake/Output Summary (Last 24 hours) at 06/16/12 1452 Last data filed at 06/16/12 0900  Gross per 24 hour  Intake    460 ml  Output   3025 ml  Net  -2565 ml   Filed Weights   06/15/12 0441 06/15/12 2040 06/15/12 2345  Weight: 59.966 kg (132 lb 3.2 oz) 63.2 kg (139 lb 5.3 oz) 60.8 kg (134 lb 0.6 oz)    Exam:   General:  Alert and oriented x3, mild distress secondary to back pain discomfort.  Cardiovascular: Regular rate and rhythm, S1-S2, 2/6 systolic ejection murmur  Respiratory: Lungs clear. Minimal decreased breath sounds at the bases   Abdomen: Soft, nontender, nondistended, positive bowel sounds  Extremities: No clubbing or cyanosis, trace pitting edema  Data Reviewed: Basic Metabolic Panel:  Lab 06/16/12 8119 06/15/12 2107 06/15/12 0958 06/14/12 0630 06/13/12 1758 06/13/12 0555  NA 139 131* 135 137 -- 137  K 3.3* 3.6 3.5 4.0 -- 4.5  CL 97 92* 96 97 -- 99  CO2 22 21 23 20  -- 21  GLUCOSE 173* 191* 216* 131* -- 74  BUN 50* 82* 82* 78* -- 73*  CREATININE 3.49* 4.91* 5.06* 4.88* -- 4.74*  CALCIUM 9.3 9.5 9.2 9.8 -- 9.9  MG -- -- -- -- -- --  PHOS -- 6.9* -- -- 6.7* --   Liver Function Tests:  Lab 06/15/12 2107 06/13/12 0555  AST -- 15  ALT -- 15  ALKPHOS -- 64  BILITOT -- 0.2*  PROT -- 6.9  ALBUMIN 2.8* 2.8*   CBC:  Lab 06/16/12 1478 06/15/12 2107 06/15/12 2956 06/13/12 2130  06/12/12 1901  WBC 8.1 9.7 9.3 9.7 10.9*  NEUTROABS -- -- -- -- 9.0*  HGB 10.1* 9.4* 9.9* 9.8* 9.7*  HCT 30.5* 28.6* 29.2* 29.5* 29.9*  MCV 94.4 93.5 93.3 94.6 94.3  PLT 283 286 284 248 249   Cardiac Enzymes:  Lab 06/12/12 1901  CKTOTAL --  CKMB --  CKMBINDEX --  TROPONINI <0.30   BNP (last 3 results)  Basename 06/16/12 0655 06/12/12 1901  PROBNP 29461.0* 30474.0*   CBG:  Lab 06/16/12 1142 06/16/12 0730 06/16/12 0020 06/15/12 1818 06/15/12 1051  GLUCAP 185* 162* 115* 220* 182*      Studies: Dg Chest 2 View  06/12/2012   IMPRESSION:  1.  No  change from previous exam. 2.  Persistent moderate right pleural effusion and smaller left effusion.   Original Report Authenticated By: Signa Kell, M.D.     Scheduled Meds:    . amLODipine  5 mg Oral QPM  . atorvastatin  40 mg Oral Daily  . carvedilol  6.25 mg Oral BID WC  . darbepoetin (ARANESP) injection - DIALYSIS  100 mcg Intravenous Q Fri-HD  . doxercalciferol  1 mcg Intravenous Q T,Th,Sa-HD  . feeding supplement (NEPRO CARB STEADY)  237 mL Oral TID WC  . heparin  5,000 Units Subcutaneous Q8H  . insulin aspart  0-9 Units Subcutaneous TID WC  . multivitamin  1 tablet Oral Daily  . sodium chloride  3 mL Intravenous Q12H   Continuous Infusions:   Principal Problem:  *new onset CHF (NYHA class II, ACC/AHA stage C) Active Problems:  ESRD (end stage renal disease)  DM (diabetes mellitus), type 2 with renal complications  Hyponatremia  Hyperlipidemia  Gout  HTN (hypertension)    Time spent: 30 minutes    Hollice Espy  [facility] Pager [phone]. If 8PM-8AM, please contact night-coverage at [email or web address], password [email or web address] 06/16/2012, 2:52 PM  LOS: 4 days

## 2012-06-16 NOTE — Progress Notes (Signed)
Patient arrived back on unit from hemodialysis around 00:21. Patient alert and oriented no complaints of pain, niece at bedside.  Patient woke up at 2:15 confused and pulling off telemetry box.  Patient had on a thick night robe and blankets. Reoriented patient, removed robe and repositioned patient.  Patient fell asleep within a few minutes.  Niece refused to allow vitals to be taken.  Daphane Shepherd, NP notified. Will continue to monitor. Steele Berg RN

## 2012-06-17 DIAGNOSIS — M109 Gout, unspecified: Secondary | ICD-10-CM

## 2012-06-17 LAB — CBC WITH DIFFERENTIAL/PLATELET
Basophils Absolute: 0 10*3/uL (ref 0.0–0.1)
Basophils Relative: 0 % (ref 0–1)
Eosinophils Relative: 2 % (ref 0–5)
HCT: 29.3 % — ABNORMAL LOW (ref 36.0–46.0)
MCHC: 33.4 g/dL (ref 30.0–36.0)
MCV: 94.8 fL (ref 78.0–100.0)
Monocytes Absolute: 1.4 10*3/uL — ABNORMAL HIGH (ref 0.1–1.0)
RDW: 14.6 % (ref 11.5–15.5)

## 2012-06-17 LAB — RENAL FUNCTION PANEL
Calcium: 9.1 mg/dL (ref 8.4–10.5)
GFR calc Af Amer: 13 mL/min — ABNORMAL LOW (ref >90)
Glucose, Bld: 204 mg/dL — ABNORMAL HIGH (ref 70–99)
Phosphorus: 3.8 mg/dL (ref 2.3–4.6)
Sodium: 133 mEq/L — ABNORMAL LOW (ref 135–145)

## 2012-06-17 MED ORDER — ALPRAZOLAM 0.5 MG PO TABS
0.5000 mg | ORAL_TABLET | Freq: Three times a day (TID) | ORAL | Status: DC | PRN
  Administered 2012-06-17 (×2): 0.5 mg via ORAL
  Filled 2012-06-17 (×2): qty 1

## 2012-06-17 MED ORDER — ALLOPURINOL 100 MG PO TABS
50.0000 mg | ORAL_TABLET | Freq: Every day | ORAL | Status: DC
  Administered 2012-06-18 – 2012-06-19 (×2): 50 mg via ORAL
  Administered 2012-06-20: 11:00:00 via ORAL
  Filled 2012-06-17 (×5): qty 0.5

## 2012-06-17 MED ORDER — BOOST / RESOURCE BREEZE PO LIQD
1.0000 | ORAL | Status: DC
  Administered 2012-06-18 – 2012-06-19 (×2): 1 via ORAL

## 2012-06-17 MED ORDER — DOXERCALCIFEROL 4 MCG/2ML IV SOLN
INTRAVENOUS | Status: AC
  Administered 2012-06-17: 1 ug via INTRAVENOUS
  Filled 2012-06-17: qty 2

## 2012-06-17 NOTE — Progress Notes (Signed)
[facility] PROGRESS NOTE  Kimberly Norman AVW:[id number] DOB: 01-19-1919 DOA: 06/12/2012 PCP: [name], MD  Interim summary:  Patient is a 79 year old white female past no history of diabetes, hypertension and hyperlipidemia who has near end-stage renal disease and even had a fistula placed in her right forearm who presented with worsening shortness of breath. Patient's felt that her breathing had been 1 going downhill for the last few months, but is especially bad in the past week. She was evaluated in the emergency room and found to be in secondary decompensated CHF. In addition, her creatinine had increased from 3.8-4.5. Nephrology and cardiology were consulted. Patient was started on IV Lasix and admitted to the hospitalist service.  In the first 3 days, patient responded somewhat to Lasix but was still significantly volume overloaded. Dialysis was attempted on 1/3, however her fistula was not able to be accessed. A temporary dialysis catheter was placed and the patient underwent dialysis on the evening of 1/5. Nephrology planned for a total of 3 dialysis sessions over the course of 3 days. After 2 sessions, the patient is down 13 pounds and minus almost 5 L of fluid. She is feeling significantly better with plans to follow dialysis session tonight.  In discussion with the patient's niece who is her caregiver and power of attorney, the plan will be for patient to go to short-term skilled nursing. Physical therapy is recommending home health PT but only with 24-hour supervision which the niece cannot give. OT has been also consult with. Prior to discharge, nephrology will decide what sort of long-term dialysis access plans will be made.   Assessment/Plan: 1. Principal Problem: 2.  *new onset CHF (NYHA class II, ACC/AHA stage C): New diagnosis.  Has had some effectiveness with diuresis, and now even more effective with dialysis-weight is down 13 pounds and she is minus over 4.5L.  Echocardiogram  notes global hypokinesis with a significantly decreased ejection fraction of 25% as well as grade 1 diastolic dysfunction moderate pulmonary hypertension. Appreciate cardiology consult. Plan will be to dialyze for 3 days total including today. 3. Active Problems: 4.  ESRD (end stage renal disease): Appreciate nephrology consult. Patient successfully dialyzed the last 2 days and again later today 5.  DM (diabetes mellitus): Stable. CBGs starting to trend up into the 180s given her increasing appetite 6.  Hyponatremia: Resolved with diuresis 7.  Hyperlipidemia 8.  Gout: Stable. Watch what she is being diuresed. Started patient on allopurinol 50 mg by mouth daily as recommended by nephrology. 9.  HTN (hypertension): Blood pressure stable with a systolic in the 130s 10. Anemia of chronic disease: Stable  Code Status: Full code Family Communication: Discussed with niece at the bedside Disposition Plan: Short-term skilled nursing once long-term dialysis access is decided upon   Consultants:  [facility] cardiology  [facility]  Procedures:  Echocardiogram done-global hypokinesis. Grade 1 diastolic dysfunction. Ejection fraction 25%. Pulmonary hypertension-moderate  Antibiotics:  None  HPI/Subjective: Patient doing much better. Breathing easier. She appears more pleasant. No other complaints.  Objective: Filed Vitals:   06/16/12 1948 06/16/12 2023 06/17/12 0438 06/17/12 0929  BP: 139/66 139/68 135/64 134/63  Pulse: 82 80 81 79  Temp: 97.7 F (36.5 C) 98.2 F (36.8 C) 98.2 F (36.8 C) 97.5 F (36.4 C)  TempSrc: Oral Oral Oral Oral  Resp: 37 20 20 20   Height:      Weight: 59.2 kg (130 lb 8.2 oz)     SpO2: 99% 92% 93% 94%  Intake/Output Summary (Last 24 hours) at 06/17/12 1417 Last data filed at 06/17/12 1158  Gross per 24 hour  Intake    240 ml  Output   2141 ml  Net  -1901 ml   Filed Weights   06/15/12 2345 06/16/12 1700 06/16/12 1948  Weight: 60.8 kg  (134 lb 0.6 oz) 61.4 kg (135 lb 5.8 oz) 59.2 kg (130 lb 8.2 oz)    Exam:   General:  Alert and oriented x3, no acute distress  Cardiovascular: Regular rate and rhythm, S1-S2, 2/6 systolic ejection murmur  Respiratory: Lungs clear. Minimal decreased breath sounds at the bases   Abdomen: Soft, nontender, nondistended, positive bowel sounds  Extremities: No clubbing or cyanosis, trace pitting edema  Data Reviewed: Basic Metabolic Panel:  Lab 06/17/12 9562 06/16/12 0655 06/15/12 2107 06/15/12 0958 06/14/12 0630 06/13/12 1758  NA 133* 139 131* 135 137 --  K 3.5 3.3* 3.6 3.5 4.0 --  CL 94* 97 92* 96 97 --  CO2 25 22 21 23 20  --  GLUCOSE 204* 173* 191* 216* 131* --  BUN 43* 50* 82* 82* 78* --  CREATININE 3.39* 3.49* 4.91* 5.06* 4.88* --  CALCIUM 9.1 9.3 9.5 9.2 9.8 --  MG -- -- -- -- -- --  PHOS 3.8 -- 6.9* -- -- 6.7*   Liver Function Tests:  Lab 06/17/12 1108 06/15/12 2107 06/13/12 0555  AST -- -- 15  ALT -- -- 15  ALKPHOS -- -- 64  BILITOT -- -- 0.2*  PROT -- -- 6.9  ALBUMIN 2.7* 2.8* 2.8*   CBC:  Lab 06/17/12 1058 06/16/12 0655 06/15/12 2107 06/15/12 0958 06/13/12 0555 06/12/12 1901  WBC 9.6 8.1 9.7 9.3 9.7 --  NEUTROABS 6.9 -- -- -- -- 9.0*  HGB 9.8* 10.1* 9.4* 9.9* 9.8* --  HCT 29.3* 30.5* 28.6* 29.2* 29.5* --  MCV 94.8 94.4 93.5 93.3 94.6 --  PLT 287 283 286 284 248 --   Cardiac Enzymes:  Lab 06/12/12 1901  CKTOTAL --  CKMB --  CKMBINDEX --  TROPONINI <0.30   BNP (last 3 results)  Basename 06/16/12 0655 06/12/12 1901  PROBNP 29461.0* 30474.0*   CBG:  Lab 06/17/12 1118 06/17/12 0730 06/16/12 2020 06/16/12 1630 06/16/12 1142  GLUCAP 182* 161* 81 198* 185*      Studies: Dg Chest 2 View  06/12/2012   IMPRESSION:  1.  No change from previous exam. 2.  Persistent moderate right pleural effusion and smaller left effusion.   Original Report Authenticated By: Signa Kell, M.D.     Scheduled Meds:    . amLODipine  5 mg Oral QPM  . atorvastatin   40 mg Oral Daily  . carvedilol  6.25 mg Oral BID WC  . darbepoetin (ARANESP) injection - DIALYSIS  100 mcg Intravenous Q Fri-HD  . doxercalciferol  1 mcg Intravenous Q T,Th,Sa-HD  . feeding supplement (NEPRO CARB STEADY)  237 mL Oral TID WC  . feeding supplement  1 Container Oral Q24H  . heparin  5,000 Units Subcutaneous Q8H  . insulin aspart  0-9 Units Subcutaneous TID WC  . multivitamin  1 tablet Oral Daily  . sodium chloride  3 mL Intravenous Q12H   Continuous Infusions:   Principal Problem:  *new onset CHF (NYHA class II, ACC/AHA stage C) Active Problems:  ESRD (end stage renal disease)  DM (diabetes mellitus), type 2 with renal complications  Hyponatremia  Hyperlipidemia  Gout  HTN (hypertension)    Time spent: 20 minutes  Hollice Espy  [facility] Pager [phone]. If 8PM-8AM, please contact night-coverage at [email or web address], password [email or web address] 06/17/2012, 2:17 PM  LOS: 5 days

## 2012-06-17 NOTE — Progress Notes (Signed)
Physical Therapy Evaluation Patient Details Name: Kimberly Norman MRN: [id number] DOB: 01-20-1919 Today's Date: 06/17/2012 Time: 0942-1000 PT Time Calculation (min): 18 min  PT Assessment / Plan / Recommendation Clinical Impression  79 yo female admitted with significant CHF exacerbation presents to PT with decr functional mobility, decr activity tol; Will benefit from PT to maximize independence and safety with mobility, facilitate dc planning to enable safe dc home, which is pt's goal; Recommend OT consult to address ADLs in prep for hopeful dc home    PT Assessment  Patient needs continued PT services    Follow Up Recommendations  Home health PT;Supervision/Assistance - 24 hour    Does the patient have the potential to tolerate intense rehabilitation      Barriers to Discharge Decreased caregiver support Still, pt reports she is willing and able to pay for more care in the homw    Equipment Recommendations  Rolling walker with 5" wheels (possibly 3in1)    Recommendations for Other Services OT consult   Frequency Min 3X/week    Precautions / Restrictions Precautions Precautions: Fall Precaution Comments: Significant fatigue with activity Restrictions Other Position/Activity Restrictions: Desats with actvivity   Pertinent Vitals/Pain Initiated session on Room Air as pt states she does not normally use supplemental O2;  O2 sats decr to 88% with amb on Room Air Restarted O2 via [place] 2 L and sats incr to greater than or equal to 92%      Mobility  Bed Mobility Bed Mobility: Not assessed (presented to PT in recliner) Transfers Transfers: Sit to Stand;Stand to Sit Sit to Stand: 3: Mod assist;With armrests;From chair/3-in-1 Stand to Sit: 4: Min assist;With armrests;With upper extremity assist;To chair/3-in-1 Details for Transfer Assistance: Cues for prepositioning, safety, and to self-monitor for activity tolerance; Pt quite dependent on UE support and needed mod physical assist  for rise Ambulation/Gait Ambulation/Gait Assistance: 4: Min assist Ambulation Distance (Feet): 15 Feet Assistive device: 1 person hand held assist Ambulation/Gait Assistance Details: Very slow moving, fatiguing quickly; Walked in room with 1 person handheld assist; Unsteady gait, with dependence on UE support; (Pt will likely be resistent to using RW) Gait Pattern: Decreased stride length Gait velocity: slowed    Shoulder Instructions     Exercises     PT Diagnosis: Difficulty walking;Generalized weakness  PT Problem List: Decreased strength;Decreased activity tolerance;Decreased balance;Decreased mobility;Decreased knowledge of use of DME;Cardiopulmonary status limiting activity PT Treatment Interventions: DME instruction;Gait training;Stair training;Functional mobility training;Therapeutic activities;Therapeutic exercise;Balance training;Patient/family education   PT Goals Acute Rehab PT Goals PT Goal Formulation: With patient Time For Goal Achievement: 07/01/12 Potential to Achieve Goals: Good Pt will go Supine/Side to Sit: with modified independence PT Goal: Supine/Side to Sit - Progress: Goal set today Pt will go Sit to Supine/Side: with modified independence PT Goal: Sit to Supine/Side - Progress: Goal set today Pt will go Sit to Stand: with modified independence PT Goal: Sit to Stand - Progress: Goal set today Pt will go Stand to Sit: with modified independence PT Goal: Stand to Sit - Progress: Goal set today Pt will Ambulate: >150 feet;with modified independence;with least restrictive assistive device PT Goal: Ambulate - Progress: Goal set today Pt will Go Up / Down Stairs: 3-5 stairs;with rail(s);with modified independence PT Goal: Up/Down Stairs - Progress: Goal set today  Visit Information  Last PT Received On: 06/17/12 Assistance Needed: +1    Subjective Data  Subjective: Very tired; Eyes closed at least half of session, but pt agreeable to getting up Patient  Stated Goal: Home   Prior Functioning  Home Living Lives With: Alone (alone per pt) Available Help at Discharge: Available 24 hours/day;Available PRN/intermittently;Family;Friend(s) (Pt clearly stating she can pay for prn assist) Type of Home: House Home Access: Stairs to enter [employer] of Steps: 3 Entrance Stairs-Rails: Left Home Layout: One level Bathroom Shower/Tub: Tub/shower unit;Curtain Home Adaptive Equipment: Hand-held shower hose Prior Function Level of Independence: Independent Able to Take Stairs?: Yes Driving: Yes Communication Communication: No difficulties    Cognition  Overall Cognitive Status: Appears within functional limits for tasks assessed/performed Arousal/Alertness: Awake/alert Orientation Level: Appears intact for tasks assessed Behavior During Session: [facility] for tasks performed    Extremity/Trunk Assessment Right Upper Extremity Assessment RUE ROM/Strength/Tone: [facility] for tasks assessed Left Upper Extremity Assessment LUE ROM/Strength/Tone: WFL for tasks assessed Right Lower Extremity Assessment RLE ROM/Strength/Tone: Deficits RLE ROM/Strength/Tone Deficits: Generalized weakness, with need for physical assist for successful sit to stand Left Lower Extremity Assessment LLE ROM/Strength/Tone: Deficits LLE ROM/Strength/Tone Deficits: Generalized weakness, with the need for physical assist for sit to stand Trunk Assessment Trunk Assessment: Normal (though with some back pain)   Balance    End of Session PT - End of Session Activity Tolerance: Patient limited by fatigue Patient left: in chair;with call bell/phone within reach;Other (comment);with family/visitor present (Renal group in to see pt) Nurse Communication: Mobility status  GP     Van Clines [facility] [place], [place] [phone]  06/17/2012, 10:54 AM

## 2012-06-17 NOTE — Progress Notes (Signed)
[facility]  Subjective:  Awake, alert, resting in chair. HD last night with plan for HD today (3 sessions in 3 days).  Stated felt better after ativan before HD but now would prefer orals before-changed in meds.  No sleep due to bed soft.  Patient uncertain on plans for R AVF  Objective: Vital signs in last 24 hours: Blood pressure 134/63, pulse 79, temperature 97.5 F (36.4 C), temperature source Oral, resp. rate 20, height 5\' 5"  (1.651 m), weight 130 lb 8.2 oz (59.2 kg), SpO2 94.00%.    PHYSICAL EXAM General--as above Chest--Clear and equal.  Unlaobored Heart--no rub Abd--nontender Extr--1+ pretib edema, RLE AVF patent but with ecchymosis noted around it. No pain. Fistula very soft (in place for 3 years now)    Intake/Output Summary (Last 24 hours) at 06/17/12 1120 Last data filed at 06/17/12 0900  Gross per 24 hour  Intake    360 ml  Output   2001 ml  Net  -1641 ml  No UOP recorded.   Weight change: -3 lb 15.5 oz (-1.8 kg)    Lab Results:   Lab 06/16/12 0655 06/15/12 2107 06/15/12 0958 06/13/12 1758  NA 139 131* 135 --  K 3.3* 3.6 3.5 --  CL 97 92* 96 --  CO2 22 21 23  --  BUN 50* 82* 82* --  CREATININE 3.49* 4.91* 5.06* --  ALB -- -- -- --  GLUCOSE 173* -- -- --  CALCIUM 9.3 9.5 9.2 --  PHOS -- 6.9* -- 6.7*     Basename 06/16/12 0655 06/15/12 2107  WBC 8.1 9.7  HGB 10.1* 9.4*  HCT 30.5* 28.6*  PLT 283 286     I have reviewed the patient's current medications. Scheduled:    . amLODipine  5 mg Oral QPM  . atorvastatin  40 mg Oral Daily  . carvedilol  6.25 mg Oral BID WC  . darbepoetin (ARANESP) injection - DIALYSIS  100 mcg Intravenous Q Fri-HD  . doxercalciferol  1 mcg Intravenous Q T,Th,Sa-HD  . feeding supplement (NEPRO CARB STEADY)  237 mL Oral TID WC  . heparin  5,000 Units Subcutaneous Q8H  . insulin aspart  0-9 Units Subcutaneous TID WC  . multivitamin  1 tablet Oral Daily  . sodium chloride  3 mL Intravenous Q12H     Assessment/Plan: 1.  Acute systolic CHF exacerbation with EF 25% refractory to medical management now requiring HD.  *improving. Will continue HD for plan of 3 consecutive days with final day today. Next HD Thursday.  *Appreciate Cardiology recommendations-continue beta blocker  2. CKD Stage V-now likely ESRD. Received dialysis 1/6. For patient comfort will schedule xanax PO before dialysis.  *Labs ordered/pending today.  *s/p tunnel HD cath. AVF in place 3 years and does not appear mature. Patient to consider options and follow up with vascular vs. Continuing to use tunnel HD cath.    3. Anemia-% saturation 16% on 12/12 now s/p 2 Ferraheme doses outpatient. No indication to repeat at this time. Hgb stable around baseline of 10. Will start Aranesp.  4. Secondary Hyperparathyroidism-PTH not elevated in December at 40.  Hectorol 1 mcg each HD 5. Gout-uric acid 6.8. Per primary team would consider low dose allopurinol such as 50mg  daily or 100mg  postdialysis on HD days.  6. HTN-controlled. continue beta blocker . Stopped spironolactone permanently given now on HD. d/ced hydralazine to avoid hypotension.   Other medical problems per primary team. 7. DM-per primary team  8. Hypoalbuminemia-albumin 2.8. Ordered Nepro  TID with meals as patient can tolerate. Consider repeat albumin within a week.  9. Hyperlipidemia-continue statin    LOS: 5 days   Aldine Contes. Marti Sleigh, MD, PGY2 06/17/2012 11:20 AM

## 2012-06-17 NOTE — Plan of Care (Signed)
Problem: Food- and Nutrition-Related Knowledge Deficit (NB-1.1) Goal: Nutrition education Formal process to instruct or train a patient/client in a skill or to impart knowledge to help patients/clients voluntarily manage or modify food choices and eating behavior to maintain or improve health.  Outcome: Completed/Met Date Met:  06/17/12 Nutrition Education Note  RD consulted for Renal Education. Provided Choose-A-Meal Booklet to patient/family. Reviewed food groups and provided written recommended serving sizes specifically determined for patient's current nutritional status.   Explained why diet restrictions are needed and provided lists of foods to limit/avoid that are high potassium, sodium, and phosphorus. Provided specific recommendations on safer alternatives of these foods. Strongly encouraged compliance of this diet.   Discussed importance of protein intake at each meal and snack. Provided examples of how to maximize protein intake throughout the day. Discussed need for fluid restriction with dialysis, importance of minimizing weight gain between HD treatments, and renal-friendly beverage options.  Encouraged pt to discuss specific diet questions/concerns with RD at HD outpatient facility. Teach back method used.  Expect fair to good compliance.  Body mass index is 21.72 kg/(m^2). Pt meets criteria for WNL based on current BMI.  Current diet order is Renal 80-90, patient is consuming approximately 75% of meals at this time. Labs and medications reviewed. No further nutrition interventions warranted at this time. RD contact information provided. If additional nutrition issues arise, please re-consult RD.  Clarene Duke RD, LDN Pager [phone] After Hours pager [phone]

## 2012-06-17 NOTE — Progress Notes (Signed)
I have seen and examined this patient and agree with the plan of care.  Allayah Raineri W 06/17/2012, 3:59 PM   

## 2012-06-17 NOTE — Clinical Social Work Psychosocial (Signed)
Clinical Social Work Department BRIEF PSYCHOSOCIAL ASSESSMENT 06/17/2012  Patient:  Kimberly Norman, Kimberly Norman     Account Number:  [id number]     Admit date:  06/12/2012  Clinical Social Worker:  Delmer Islam  Date/Time:  06/17/2012 04:43 AM  Referred by:  Physician  Date Referred:  06/17/2012 Referred for  SNF Placement   Other Referral:   Interview type:  Patient Other interview type:   Patient's niece, Kimberly Norman was also at the bedside and is patient's POA.    PSYCHOSOCIAL DATA Living Status:  ALONE Admitted from facility:   Level of care:   Primary support name:  Kimberly Norman Primary support relationship to patient:  FAMILY Degree of support available:   Strong support    CURRENT CONCERNS Current Concerns  Post-Acute Placement   Other Concerns:    SOCIAL WORK ASSESSMENT / PLAN CSW talked with patient and her niece regarding discharge plans. Also talked about PT's recommendation of home with 24 hr assistance/supervision and inquired of patient & niece if this was available. It was acknowledged that this was not available as niece is primary caretaker. Patient is open to giving short-term rehab a chance before going home and their first choice is [facility] as she lives around the corner from the facility.   Assessment/plan status:  Psychosocial Support/Ongoing Assessment of Needs Other assessment/ plan:   Information/referral to community resources:   Niece given SNF list for [facility]    PATIENT'S/FAMILY'S RESPONSE TO PLAN OF CARE: Patient and niece pleasant and open to talking with CSW. Patient wants to go home but realizes that she does not have the 24/7 assistance she will need initially. Patient open to giving SNF a try for at least one week. Ms. Kimberly Norman pleased that patient willing to go to rehab for a brief period.

## 2012-06-17 NOTE — Progress Notes (Signed)
Subjective:  Patient is doing well this am. Mild dyspnea during the night. Weight down 13 lb since admission. Daughter does not want patient to go home until she is stronger. Rhythm NSR with PACs.  Objective:  Vital Signs in the last 24 hours: Temp:  [97.7 F (36.5 C)-98.2 F (36.8 C)] 98.2 F (36.8 C) (01/07 0438) Pulse Rate:  [67-109] 81  (01/07 0438) Resp:  [20-37] 20  (01/07 0438) BP: (109-145)/(57-95) 135/64 mmHg (01/07 0438) SpO2:  [92 %-99 %] 93 % (01/07 0438) Weight:  [130 lb 8.2 oz (59.2 kg)-135 lb 5.8 oz (61.4 kg)] 130 lb 8.2 oz (59.2 kg) (01/06 1948)  Intake/Output from previous day: 01/06 0701 - 01/07 0700 In: 360 [P.O.:360] Out: 2001  Intake/Output from this shift:       . amLODipine  5 mg Oral QPM  . atorvastatin  40 mg Oral Daily  . carvedilol  6.25 mg Oral BID WC  . darbepoetin (ARANESP) injection - DIALYSIS  100 mcg Intravenous Q Fri-HD  . doxercalciferol  1 mcg Intravenous Q T,Th,Sa-HD  . feeding supplement (NEPRO CARB STEADY)  237 mL Oral TID WC  . heparin  5,000 Units Subcutaneous Q8H  . insulin aspart  0-9 Units Subcutaneous TID WC  . multivitamin  1 tablet Oral Daily  . sodium chloride  3 mL Intravenous Q12H      Physical Exam: The patient appears to be in no distress. General: Well developed, well nourished elderly WF in no acute distress.  Head: Normocephalic, atraumatic, sclera non-icteric, no xanthomas, nares are without discharge.  Neck: Negative for carotid bruits. JVD not elevated.  Lungs: Breath sounds diminished at right base. Heart: Regular rhythm with occasional ectopy, controlled rate, with S1 S2. No murmurs, rubs, or gallops appreciated.  Abdomen: Soft, non-tender, non-distended with normoactive bowel sounds. No hepatomegaly. No rebound/guarding. No obvious abdominal masses.  Msk: Strength and tone appear normal for age.  Extremities: No clubbing or cyanosis. Mild pretibial edema. No erythema. Distal pedal pulses are 2+ and equal  bilaterally.  Neuro: Alert and oriented X 3. No facial asymmetry. No focal deficit. Moves all extremities spontaneously.  Psych: Responds to questions appropriately with a normal affect.  Complaining today about her hospital bed being so uncomfortable.    Lab Results:  Basename 06/16/12 0655 06/15/12 2107  WBC 8.1 9.7  HGB 10.1* 9.4*  PLT 283 286    Basename 06/16/12 0655 06/15/12 2107  NA 139 131*  K 3.3* 3.6  CL 97 92*  CO2 22 21  GLUCOSE 173* 191*  BUN 50* 82*  CREATININE 3.49* 4.91*   No results found for this basename: TROPONINI:2,CK,MB:2 in the last 72 hours Hepatic Function Panel  Basename 06/15/12 2107  PROT --  ALBUMIN 2.8*  AST --  ALT --  ALKPHOS --  BILITOT --  BILIDIR --  IBILI --   No results found for this basename: CHOL in the last 72 hours No results found for this basename: PROTIME in the last 72 hours  Imaging: Imaging results have been reviewed  Cardiac Studies: Telemetry shows normal sinus rhythm with frequent premature atrial beats Assessment/Plan:  Patient Active Hospital Problem List: new onset CHF (NYHA class II, ACC/AHA stage C) (06/12/2012)   Assessment: Improved since hemodialysis.  Weight is down 9 pounds    Plan: Continue amlodipine carvedilol and hydralazine.  Tolerating carvedilol  6.25 mg twice a day   HTN (hypertension) (06/12/2012)   Assessment: Satisfactory control on current therapy.   Plan:  Continue present medication.  Now that she is on hemodialysis I will defer dosing of her Lasix to nephrology.   LOS: 5 days    Cassell Clement 06/17/2012, 7:35 AM

## 2012-06-18 DIAGNOSIS — N185 Chronic kidney disease, stage 5: Secondary | ICD-10-CM

## 2012-06-18 LAB — GLUCOSE, CAPILLARY: Glucose-Capillary: 122 mg/dL — ABNORMAL HIGH (ref 70–99)

## 2012-06-18 LAB — RENAL FUNCTION PANEL
BUN: 35 mg/dL — ABNORMAL HIGH (ref 6–23)
Calcium: 9.5 mg/dL (ref 8.4–10.5)
Creatinine, Ser: 3.17 mg/dL — ABNORMAL HIGH (ref 0.50–1.10)
Glucose, Bld: 216 mg/dL — ABNORMAL HIGH (ref 70–99)
Phosphorus: 3.5 mg/dL (ref 2.3–4.6)

## 2012-06-18 MED ORDER — ALPRAZOLAM 0.25 MG PO TABS: 0.2500 mg | ORAL_TABLET | Freq: Three times a day (TID) | ORAL | Status: DC | PRN

## 2012-06-18 MED ORDER — LISINOPRIL 2.5 MG PO TABS
2.5000 mg | ORAL_TABLET | Freq: Every day | ORAL | Status: DC
  Administered 2012-06-18: 2.5 mg via ORAL
  Filled 2012-06-18 (×3): qty 1

## 2012-06-18 MED ORDER — POLYETHYLENE GLYCOL 3350 17 G PO PACK
17.0000 g | PACK | Freq: Every day | ORAL | Status: DC
  Administered 2012-06-18 – 2012-06-19 (×2): 17 g via ORAL
  Filled 2012-06-18 (×2): qty 1

## 2012-06-18 MED ORDER — DIPHENHYDRAMINE HCL 25 MG PO CAPS
25.0000 mg | ORAL_CAPSULE | ORAL | Status: DC | PRN
  Administered 2012-06-20: 25 mg via ORAL
  Filled 2012-06-18: qty 1

## 2012-06-18 NOTE — Progress Notes (Signed)
PATIENT DETAILS Name: Kimberly Norman Age: 79 y.o. Sex: female Date of Birth: June 02, 1919 Admit Date: 06/12/2012 Admitting Physician Rhetta Mura, MD PCP:[name], Fayrene Fearing, MD  Subjective: No major issues overnight  Assessment/Plan: Principal Problem:  *Acute systolic heart failure (NYHA class II, ACC/AHA stage C) -more compensated with HD -weight down to 56 kg from 64.8 kg on admission -appreciate cards evaluation -continue with coreg -since she is HD dependent-we can probably start low dose ACEI  -Echo on 06/13/12-EF of 25 percent  Active Problems:  ESRD (end stage renal disease) -likely now ESRD-HD per renal   DM (diabetes mellitus), type 2 with renal complications -CBG's controlled -c/w SSI   Hyponatremia -resolved with HD   Hyperlipidemia -c/w Lipitor  HTN -controlled with coreg and Amlodipine  Anemia -2/2 ESRD -on Aranesp  Gout -stable -c/w Allopurinol  Disposition: Remain inpatient-SNF on discharge  DVT Prophylaxis: Prophylactic Heparin  Code Status: Full code   Procedures:  HD  CONSULTS:  cardiology and nephrology  PHYSICAL EXAM: Vital signs in last 24 hours: Filed Vitals:   06/17/12 1930 06/17/12 2036 06/18/12 0405 06/18/12 0910  BP: 104/61 120/69 109/53 109/61  Pulse: 81 107 80 83  Temp:  97.8 F (36.6 C) 98 F (36.7 C) 98 F (36.7 C)  TempSrc:  Oral Oral Oral  Resp: 30 32 20 22  Height:      Weight:  56.7 kg (125 lb)    SpO2:  98% 94% 98%    Weight change: -1.5 kg (-3 lb 4.9 oz) Body mass index is 20.80 kg/(m^2).   Gen Exam: Awake and alert with clear speech.   Neck: Supple, No JVD.   Chest: B/L Clear.   CVS: S1 S2 Regular, no murmurs.  Abdomen: soft, BS +, non tender, non distended.  Extremities: no edema, lower extremities warm to touch. Neurologic: Non Focal.   Skin: No Rash.   Wounds: N/A.   Intake/Output from previous day:  Intake/Output Summary (Last 24 hours) at 06/18/12 1150 Last data filed at 06/18/12 0900  Gross per 24 hour  Intake    360 ml  Output   2360 ml  Net  -2000 ml     LAB RESULTS: CBC  Lab 06/17/12 1058 06/16/12 0655 06/15/12 2107 06/15/12 0958 06/13/12 0555 06/12/12 1901  WBC 9.6 8.1 9.7 9.3 9.7 --  HGB 9.8* 10.1* 9.4* 9.9* 9.8* --  HCT 29.3* 30.5* 28.6* 29.2* 29.5* --  PLT 287 283 286 284 248 --  MCV 94.8 94.4 93.5 93.3 94.6 --  MCH 31.7 31.3 30.7 31.6 31.4 --  MCHC 33.4 33.1 32.9 33.9 33.2 --  RDW 14.6 14.6 14.3 14.4 14.9 --  LYMPHSABS 1.2 -- -- -- -- 0.8  MONOABS 1.4* -- -- -- -- 1.0  EOSABS 0.2 -- -- -- -- 0.0  BASOSABS 0.0 -- -- -- -- 0.0  BANDABS -- -- -- -- -- --    Chemistries   Lab 06/18/12 0952 06/17/12 1108 06/16/12 0655 06/15/12 2107 06/15/12 0958  NA 135 133* 139 131* 135  K 3.9 3.5 3.3* 3.6 3.5  CL 93* 94* 97 92* 96  CO2 27 25 22 21 23   GLUCOSE 216* 204* 173* 191* 216*  BUN 35* 43* 50* 82* 82*  CREATININE 3.17* 3.39* 3.49* 4.91* 5.06*  CALCIUM 9.5 9.1 9.3 9.5 9.2  MG -- -- -- -- --    CBG:  Lab 06/18/12 1142 06/18/12 0757 06/17/12 1118 06/17/12 0730 06/16/12 2020  GLUCAP 167* 143* 182* 161* 81  GFR Estimated Creatinine Clearance: 10.3 ml/min (by C-G formula based on Cr of 3.17).  Coagulation profile  Lab 06/13/12 0555  INR 1.13  PROTIME --    Cardiac Enzymes  Lab 06/12/12 1901  CKMB --  TROPONINI <0.30  MYOGLOBIN --    No components found with this basename: POCBNP:3 No results found for this basename: DDIMER:2 in the last 72 hours No results found for this basename: HGBA1C:2 in the last 72 hours No results found for this basename: CHOL:2,HDL:2,LDLCALC:2,TRIG:2,CHOLHDL:2,LDLDIRECT:2 in the last 72 hours No results found for this basename: TSH,T4TOTAL,FREET3,T3FREE,THYROIDAB in the last 72 hours No results found for this basename: VITAMINB12:2,FOLATE:2,FERRITIN:2,TIBC:2,IRON:2,RETICCTPCT:2 in the last 72 hours No results found for this basename: LIPASE:2,AMYLASE:2 in the last 72 hours  Urine Studies No results found for  this basename: UACOL:2,UAPR:2,USPG:2,UPH:2,UTP:2,UGL:2,UKET:2,UBIL:2,UHGB:2,UNIT:2,UROB:2,ULEU:2,UEPI:2,UWBC:2,URBC:2,UBAC:2,CAST:2,CRYS:2,UCOM:2,BILUA:2 in the last 72 hours  MICROBIOLOGY: Recent Results (from the past 240 hour(s))  CULTURE, BLOOD (ROUTINE X 2)     Status: Normal (Preliminary result)   Collection Time   06/12/12  6:55 PM      Component Value Range Status Comment   Specimen Description BLOOD ARM LEFT   Final    Special Requests BOTTLES DRAWN AEROBIC ONLY 10CC   Final    Culture  Setup Time 06/13/2012 01:34   Final    Culture     Final    Value:        BLOOD CULTURE RECEIVED NO GROWTH TO DATE CULTURE WILL BE HELD FOR 5 DAYS BEFORE ISSUING A FINAL NEGATIVE REPORT   Report Status PENDING   Incomplete   CULTURE, BLOOD (ROUTINE X 2)     Status: Normal (Preliminary result)   Collection Time   06/12/12  7:02 PM      Component Value Range Status Comment   Specimen Description BLOOD HAND LEFT   Final    Special Requests BOTTLES DRAWN AEROBIC AND ANAEROBIC B 10CC R 5CC   Final    Culture  Setup Time 06/13/2012 01:35   Final    Culture     Final    Value:        BLOOD CULTURE RECEIVED NO GROWTH TO DATE CULTURE WILL BE HELD FOR 5 DAYS BEFORE ISSUING A FINAL NEGATIVE REPORT   Report Status PENDING   Incomplete     RADIOLOGY STUDIES/RESULTS: Dg Chest 2 View  06/12/2012  *RADIOLOGY REPORT*  Clinical Data: Short of breath  CHEST - 2 VIEW  Comparison: 06/12/2012  Findings: Mild cardiac enlargement.  Stable appearance of the right pleural effusion with overlying atelectasis/consolidation.  Smaller left effusion appears similar to previous exam.  Mild multilevel spondylosis is identified within the thoracic spine.  IMPRESSION:  1.  No change from previous exam. 2.  Persistent moderate right pleural effusion and smaller left effusion.   Original Report Authenticated By: Signa Kell, M.D.    Ir Fluoro Guide Cv Line Right  06/15/2012  *RADIOLOGY REPORT*  Indication: 79 year old with end-stage  renal disease and needs hemodialysis.  PROCEDURE(S): FLUOROSCOPIC AND ULTRASOUND GUIDED PLACEMENT OF A TUNNELED DIAYSIS CATHETER  Physician:  Rachelle Hora. Henn, MD  Medications: Versed 1 mg, Fentanyl 25 mcg. A radiology nurse monitored the patient for moderate sedation.  Ancef 1 gm.  As antibiotic prophylaxis, Ancef  was ordered pre-procedure and administered intravenously within one hour of incision.  Moderate sedation time: 20 minutes  Fluoroscopy time: 1.35minutes  Procedure:Informed consent was obtained for placement of a tunneled dialysis catheter.  The patient was placed supine on the interventional table.  Ultrasound confirmed a patent right internal jugularvein.  Ultrasound images were obtained for documentation. The right side of the neck was prepped and draped in a sterile fashion.  The right side of the neck was anesthetized with 1% lidocaine.  Maximal barrier sterile technique was utilized including caps, mask, sterile gowns, sterile gloves, sterile drape, hand hygiene and skin antiseptic.  A small incision was made with #11 blade scalpel.  A 21 gauge needle directed into the right internal jugular vein with ultrasound guidance.  A micropuncture dilator set was placed.  A 23 cm tip to cuff HemoSplit catheter was selected.  The skin below the right clavicle was anesthetized and a small incision was made with a #11 blade scalpel.  A subcutaneous tunnel was formed to the vein dermatotomy site.  The catheter was brought through the tunnel.  The vein dermatotomy site was dilated to accommodate a peel-away sheath.  The catheter was placed through the peel-away sheath and directed into the central venous structures.  The tip of the catheter was placed at the junction of the SVC and right atrium with fluoroscopy.  Fluoroscopic images were obtained for documentation.  Both lumens were found to aspirate and flush well.  The proper amount of heparin was flushed in both lumens.  The vein dermatotomy site was closed using  a single layer of absorbable suture and Dermabond.  The catheter was secured to the skin using Prolene suture.  Findings:Catheter tip at the junction of the SVC and right atrium. Moderate sized right pleural effusion.  Complications: None  Impression:Successful placement of a right jugular tunneled dialysis catheter using ultrasound and fluoroscopic guidance.   Original Report Authenticated By: Richarda Overlie, M.D.    Ir US Guide Vasc Access Right  06/15/2012  *RADIOLOGY REPORT*  Indication: 79 year old with end-stage renal disease and needs hemodialysis.  PROCEDURE(S): FLUOROSCOPIC AND ULTRASOUND GUIDED PLACEMENT OF A TUNNELED DIAYSIS CATHETER  Physician:  Rachelle Hora. Henn, MD  Medications: Versed 1 mg, Fentanyl 25 mcg. A radiology nurse monitored the patient for moderate sedation.  Ancef 1 gm.  As antibiotic prophylaxis, Ancef  was ordered pre-procedure and administered intravenously within one hour of incision.  Moderate sedation time: 20 minutes  Fluoroscopy time: 1.22minutes  Procedure:Informed consent was obtained for placement of a tunneled dialysis catheter.  The patient was placed supine on the interventional table.  Ultrasound confirmed a patent right internal jugularvein.  Ultrasound images were obtained for documentation. The right side of the neck was prepped and draped in a sterile fashion.  The right side of the neck was anesthetized with 1% lidocaine.  Maximal barrier sterile technique was utilized including caps, mask, sterile gowns, sterile gloves, sterile drape, hand hygiene and skin antiseptic.  A small incision was made with #11 blade scalpel.  A 21 gauge needle directed into the right internal jugular vein with ultrasound guidance.  A micropuncture dilator set was placed.  A 23 cm tip to cuff HemoSplit catheter was selected.  The skin below the right clavicle was anesthetized and a small incision was made with a #11 blade scalpel.  A subcutaneous tunnel was formed to the vein dermatotomy site.  The  catheter was brought through the tunnel.  The vein dermatotomy site was dilated to accommodate a peel-away sheath.  The catheter was placed through the peel-away sheath and directed into the central venous structures.  The tip of the catheter was placed at the junction of the SVC and right atrium with fluoroscopy.  Fluoroscopic images were obtained  for documentation.  Both lumens were found to aspirate and flush well.  The proper amount of heparin was flushed in both lumens.  The vein dermatotomy site was closed using a single layer of absorbable suture and Dermabond.  The catheter was secured to the skin using Prolene suture.  Findings:Catheter tip at the junction of the SVC and right atrium. Moderate sized right pleural effusion.  Complications: None  Impression:Successful placement of a right jugular tunneled dialysis catheter using ultrasound and fluoroscopic guidance.   Original Report Authenticated By: Richarda Overlie, M.D.     MEDICATIONS: Scheduled Meds:   . allopurinol  50 mg Oral Daily  . amLODipine  5 mg Oral QPM  . atorvastatin  40 mg Oral Daily  . carvedilol  6.25 mg Oral BID WC  . darbepoetin (ARANESP) injection - DIALYSIS  100 mcg Intravenous Q Fri-HD  . doxercalciferol  1 mcg Intravenous Q T,Th,Sa-HD  . feeding supplement (NEPRO CARB STEADY)  237 mL Oral TID WC  . feeding supplement  1 Container Oral Q24H  . heparin  5,000 Units Subcutaneous Q8H  . insulin aspart  0-9 Units Subcutaneous TID WC  . multivitamin  1 tablet Oral Daily  . sodium chloride  3 mL Intravenous Q12H   Continuous Infusions:  PRN Meds:.acetaminophen, albuterol, ALPRAZolam, docusate sodium, ondansetron (ZOFRAN) IV  Antibiotics: Anti-infectives    None       Jeoffrey Massed, MD  [facility] Pager:[phone]  If 7PM-7AM, please contact night-coverage [email or web address] Password [email or web address] 06/18/2012, 11:50 AM   LOS: 6 days

## 2012-06-18 NOTE — Progress Notes (Signed)
OT Cancellation Note  Patient Details Name: Kimberly Norman MRN: [id number] DOB: 09-06-1918   Cancelled Treatment:    Reason Eval/Treat Not Completed: Other (comment) (Pt to D/C to SNF. Will defer OT to SNF. If OT evel needed, please reorder.) OT signing off  [facility], OTR/L  [phone] 06/18/2012 06/18/2012, 5:00 PM

## 2012-06-18 NOTE — Progress Notes (Addendum)
Subjective:  Patient is doing well this am.  No chest pain or dyspnea.  Still complains of a hospital bed that is too soft for her. Weight down 13 lb since admission.  Not yet weight today. Daughter does not want patient to go home until she is stronger.  She will initially go to a short-term skilled nursing facility. Rhythm NSR with PACs.  Objective:  Vital Signs in the last 24 hours: Temp:  [97.5 F (36.4 C)-98.4 F (36.9 C)] 98 F (36.7 C) (01/08 0405) Pulse Rate:  [71-107] 80  (01/08 0405) Resp:  [20-32] 20  (01/08 0405) BP: (104-151)/(48-71) 109/53 mmHg (01/08 0405) SpO2:  [94 %-99 %] 94 % (01/08 0405) Weight:  [125 lb (56.7 kg)-132 lb 0.9 oz (59.9 kg)] 125 lb (56.7 kg) (01/07 2036)  Intake/Output from previous day: 01/07 0701 - 01/08 0700 In: 480 [P.O.:480] Out: 2360 [Urine:160] Intake/Output from this shift:       . allopurinol  50 mg Oral Daily  . amLODipine  5 mg Oral QPM  . atorvastatin  40 mg Oral Daily  . carvedilol  6.25 mg Oral BID WC  . darbepoetin (ARANESP) injection - DIALYSIS  100 mcg Intravenous Q Fri-HD  . doxercalciferol  1 mcg Intravenous Q T,Th,Sa-HD  . feeding supplement (NEPRO CARB STEADY)  237 mL Oral TID WC  . feeding supplement  1 Container Oral Q24H  . heparin  5,000 Units Subcutaneous Q8H  . insulin aspart  0-9 Units Subcutaneous TID WC  . multivitamin  1 tablet Oral Daily  . sodium chloride  3 mL Intravenous Q12H      Physical Exam: The patient appears to be in no distress. General: Well developed, well nourished elderly WF in no acute distress.  Head: Normocephalic, atraumatic, sclera non-icteric, no xanthomas, nares are without discharge.  Neck: Negative for carotid bruits. JVD not elevated.  Lungs: Breath sounds diminished at right base. Heart: Regular rhythm with occasional ectopy, controlled rate, with S1 S2. No murmurs, rubs, or gallops appreciated.  Abdomen: Soft, non-tender, non-distended with normoactive bowel sounds. No  hepatomegaly. No rebound/guarding. No obvious abdominal masses.  Msk: Strength and tone appear normal for age.  Extremities: No clubbing or cyanosis. Mild pretibial edema. No erythema. Distal pedal pulses are 2+ and equal bilaterally.  Neuro: Alert and oriented X 3. No facial asymmetry. No focal deficit. Moves all extremities spontaneously.  Psych: Responds to questions appropriately with a normal affect.  Complaining today about her hospital bed being so uncomfortable.    Lab Results:  Basename 06/17/12 1058 06/16/12 0655  WBC 9.6 8.1  HGB 9.8* 10.1*  PLT 287 283    Basename 06/17/12 1108 06/16/12 0655  NA 133* 139  K 3.5 3.3*  CL 94* 97  CO2 25 22  GLUCOSE 204* 173*  BUN 43* 50*  CREATININE 3.39* 3.49*   No results found for this basename: TROPONINI:2,CK,MB:2 in the last 72 hours Hepatic Function Panel  Basename 06/17/12 1108  PROT --  ALBUMIN 2.7*  AST --  ALT --  ALKPHOS --  BILITOT --  BILIDIR --  IBILI --   No results found for this basename: CHOL in the last 72 hours No results found for this basename: PROTIME in the last 72 hours  Imaging: Imaging results have been reviewed  Cardiac Studies: Telemetry shows normal sinus rhythm with occasional premature atrial beats Assessment/Plan:  Patient Active Hospital Problem List:  acute systolic CHF (NYHA class II, ACC/AHA stage C) (06/12/2012)  Assessment: Improved since hemodialysis.  Weight is down.   Plan: Continue amlodipine carvedilol and hydralazine.  Tolerating carvedilol  6.25 mg twice a day.  Not on ACE inhibitor or ARB because of renal failure.   HTN (hypertension) (06/12/2012)   Assessment: Satisfactory control on current therapy.   Plan: Continue present medication.  Now that she is on hemodialysis I will defer dosing of her Lasix to nephrology.   LOS: 6 days    Cassell Clement 06/18/2012, 8:07 AM

## 2012-06-18 NOTE — Consult Note (Signed)
[facility] [place] HISTORY AND PHYSICAL   History of Present Illness:  Patient is a 79 y.o. year old female who presents for placement of a permanent hemodialysis access. .  The patient is currently on hemodialysis but had infiltration of fistula on initial attempts to cannulate.  She now has a right side catheter and was admitted with pulmonary edema.  Other chronic medical problems include diabetes, arthritis hypertension, and overall deconditioning.  She is currently non ambulatory.  Past Medical History  Diagnosis Date  . Hyperlipidemia   . Hypertension   . Diabetes mellitus Age 89    using insulin  . Chronic kidney disease     ESRD. Kidney dz since ~2008. s/p fistula placement.. Low office weight 143.   Marland Kitchen Arthritis   . Osteoporosis   . Cataract   . Constipation 10/26/10  . Gout   . Renal artery stenosis     Angiogram 2006 shoed L RAS  60% and 30% stenosis in R renal artery.    . Anemia     Receives iron infusions per nephrology    Past Surgical History  Procedure Date  . Renal angiogram 2002  . Cataract extraction, bilateral   . Hand surgery     right  . Av fistula placement 01/10/2011    Right radiocephalic AVF     Social History History  Substance Use Topics  . Smoking status: Former Smoker -- 17 years    Types: Cigarettes    Quit date: 01/24/1981  . Smokeless tobacco: Not on file  . Alcohol Use: No    Family History Family History  Problem Relation Age of Onset  . Cancer Mother 49    pancreatic cancer  . Hypertension Mother   . Heart disease Father     presumed. died at age 22    Allergies  No Known Allergies   Current Facility-Administered Medications  Medication Dose Route Frequency Provider Last Rate Last Dose  . acetaminophen (TYLENOL) tablet 650 mg  650 mg Oral Q4H PRN Roma Kayser Schorr, NP   650 mg at 06/18/12 1558  . albuterol (PROVENTIL HFA;VENTOLIN HFA) 108 (90 BASE) MCG/ACT inhaler 2 puff  2 puff Inhalation Q4H PRN  Hollice Espy, MD      . allopurinol (ZYLOPRIM) tablet 50 mg  50 mg Oral Daily Hollice Espy, MD   50 mg at 06/18/12 1029  . ALPRAZolam Prudy Feeler) tablet 0.25 mg  0.25 mg Oral TID PRN Shelva Majestic, MD      . atorvastatin (LIPITOR) tablet 40 mg  40 mg Oral Daily Rhetta Mura, MD   40 mg at 06/18/12 1029  . carvedilol (COREG) tablet 6.25 mg  6.25 mg Oral BID WC Cassell Clement, MD   6.25 mg at 06/18/12 1028  . darbepoetin (ARANESP) injection 100 mcg  100 mcg Intravenous Q Fri-HD Zada Girt, MD      . diphenhydrAMINE (BENADRYL) capsule 25 mg  25 mg Oral Q hemodialysis Shelva Majestic, MD      . docusate sodium (COLACE) capsule 100 mg  100 mg Oral BID PRN Jinger Neighbors, NP   100 mg at 06/17/12 2124  . doxercalciferol (HECTOROL) injection 1 mcg  1 mcg Intravenous Q T,Th,Sa-HD Zada Girt, MD   1 mcg at 06/17/12 1759  . feeding supplement (NEPRO CARB STEADY) liquid 237 mL  237 mL Oral TID WC Shelva Majestic, MD   237 mL at 06/16/12 0821  . feeding supplement (  RESOURCE BREEZE) liquid 1 Container  1 Container Oral Q24H Tonye Becket, RD      . heparin injection 5,000 Units  5,000 Units Subcutaneous Q8H Rhetta Mura, MD   5,000 Units at 06/18/12 1450  . insulin aspart (novoLOG) injection 0-9 Units  0-9 Units Subcutaneous TID WC Rhetta Mura, MD   2 Units at 06/18/12 1236  . lisinopril (PRINIVIL,ZESTRIL) tablet 2.5 mg  2.5 mg Oral Daily Maretta Bees, MD   2.5 mg at 06/18/12 1451  . multivitamin (RENA-VIT) tablet 1 tablet  1 tablet Oral Daily Zada Girt, MD   1 tablet at 06/18/12 1030  . ondansetron (ZOFRAN) injection 4 mg  4 mg Intravenous Q6H PRN Jinger Neighbors, NP      . polyethylene glycol (MIRALAX / GLYCOLAX) packet 17 g  17 g Oral Daily Maretta Bees, MD   17 g at 06/18/12 1451  . sodium chloride 0.9 % injection 3 mL  3 mL Intravenous Q12H Rhetta Mura, MD   3 mL at 06/18/12 1455    ROS:   General:  No weight loss, Fever, chills  HEENT: No  recent headaches, no nasal bleeding, no visual changes, no sore throat  Neurologic: No dizziness, blackouts, seizures. No recent symptoms of stroke or mini- stroke. No recent episodes of slurred speech, or temporary blindness.  Cardiac: No recent episodes of chest pain/pressure, + shortness of breath at rest.  + shortness of breath with exertion.  Denies history of atrial fibrillation or irregular heartbeat  Vascular: No history of rest pain in feet.  No history of claudication.  No history of non-healing ulcer, No history of DVT   Pulmonary: No home oxygen, no productive cough, no hemoptysis,  No asthma or wheezing  Musculoskeletal:  [ ]  Arthritis, [ ]  Low back pain,  [ ]  Joint pain  Hematologic:No history of hypercoagulable state.  No history of easy bleeding.  No history of anemia  Gastrointestinal: No hematochezia or melena,  No gastroesophageal reflux, no trouble swallowing  Urinary: [x ] chronic Kidney disease, [ x] on HD - [ ]  MWF or [ ]  TTHS, [ ]  Burning with urination, [ ]  Frequent urination, [ ]  Difficulty urinating;   Skin: No rashes  Psychological: No history of anxiety,  No history of depression   Physical Examination  Filed Vitals:   06/17/12 2036 06/18/12 0405 06/18/12 0910 06/18/12 1414  BP: 120/69 109/53 109/61 115/69  Pulse: 107 80 83 79  Temp: 97.8 F (36.6 C) 98 F (36.7 C) 98 F (36.7 C) 97.4 F (36.3 C)  TempSrc: Oral Oral Oral Oral  Resp: 32 20 22 18   Height:      Weight: 56.7 kg (125 lb)     SpO2: 98% 94% 98% 95%    Body mass index is 20.80 kg/(m^2).  General:  Alert and oriented, no acute distress HEENT: Normal Neck: No bruit or JVD Cardiac: Regular Rate and Rhythm without murmur Gastrointestinal: Soft, non-tender, non-distended, no mass Skin: No rash, ecchymosis over right radial cephalic fistula, no hematoma Extremity Pulses:  2+ radial, brachial pulses bilaterally, fistula is palpable throughout the forearm and slightly  pulsatile Musculoskeletal: No deformity or edema  Neurologic: Upper and lower extremity motor 5/5 and symmetric   ASSESSMENT: Infiltration right arm fistula.  Lengthy conversation with pt and daughter.  Pt feels she is too deconditioned right now to undergo any procedures.  Will schedule outpt office visit in 2 weeks to allow infiltration to heal as well  as get a duplex of the fistula at that point to see whether to proceed with fistulogram vs new access    PLAN:  See above  Fabienne Bruns, MD [facility] [place] Office: [phone] Pager: [phone]

## 2012-06-18 NOTE — Progress Notes (Signed)
Physical Therapy Note   Noted 24 hour assist at home is unavailable to pt   In that case, PT agrees with SNF for rehab (have updated doc flowsheets)  Thank you, Van Clines, [place] [phone]

## 2012-06-18 NOTE — Progress Notes (Signed)
I have seen and examined this patient and agree with the plan of care   [facility] W 06/18/2012, 2:40 PM

## 2012-06-18 NOTE — Progress Notes (Signed)
06/18/2012 11:10 AM  Spoke with Darel Hong with HD regarding CLIP on pt. Pt has been CLIP'd to [facility] on TTS and can start 1/9 if discharged.  I informed Darel Hong that we are now looking at placement options for the patient and that I would communicate with her about facility options once SW makes arrangements.  Will continue to monitor patient. Eunice Blase

## 2012-06-18 NOTE — Clinical Social Work Placement (Addendum)
Clinical Social Work Department CLINICAL SOCIAL WORK PLACEMENT NOTE 06/18/2012  Patient:  Kimberly Norman, Kimberly Norman  Account Number:  [id number] Admit date:  06/12/2012  Clinical Social Worker:  Genelle Bal, LCSW  Date/time:  06/18/2012 01:16 AM  Clinical Social Work is seeking post-discharge placement for this patient at the following level of care:   SKILLED NURSING   (*CSW will update this form in Epic as items are completed)   06/18/2012  Patient/family provided with [facility] Department of Clinical Social Work's list of facilities offering this level of care within the geographic area requested by the patient (or if unable, by the patient's family).  06/18/2012  Patient/family informed of their freedom to choose among providers that offer the needed level of care, that participate in Medicare, Medicaid or managed care program needed by the patient, have an available bed and are willing to accept the patient.    Patient/family informed of [facility]' ownership interest in [facility], as well as of the fact that they are under no obligation to receive care at this facility.  PASARR submitted to EDS on 06/18/2012 PASARR number received from EDS on   FL2 transmitted to all facilities in geographic area requested by pt/family on  06/18/2012 FL2 transmitted to all facilities within larger geographic area on   Patient informed that his/her managed care company has contracts with or will negotiate with  certain facilities, including the following:     Patient/family informed of bed offers received: 06/20/12  Patient chooses bed at [facility] Physician recommends and patient chooses bed at    Patient to be transferred to [facility] on 06/20/12  Patient to be transferred to facility by    The following physician request were entered in Epic:   Additional Comments: Patient's facility preference is [facility]. 06/20/12 - Discharge paperwork forwarded to [facility] and discharge packet  completed and will accompany patient to SNF. Patient's niece completed admissions paperwork at facility.

## 2012-06-18 NOTE — Progress Notes (Signed)
[facility]  Subjective:  Awake, alert, resting in chair. HD 3 days in a row with last session yesterday. Plan for HD tomorrow.  Wants sedating medication less strong than current med pre dialysis.  Patient would like to discuss options with VVS before proceeding with fistulogram.     Objective: Vital signs in last 24 hours: Blood pressure 109/61, pulse 83, temperature 98 F (36.7 C), temperature source Oral, resp. rate 22, height 5\' 5"  (1.651 m), weight 125 lb (56.7 kg), SpO2 98.00%.    PHYSICAL EXAM General--as above. Legs off side of bed onto chair for patient comfort.  Chest--Clear and equal.  Unlabored Heart--no rub Abd--nontender Extr--1+ pretib edema, RLE AVF patent but with ecchymosis noted around it. No pain. Fistula very soft (in place for 3 years now)    Intake/Output Summary (Last 24 hours) at 06/18/12 1223 Last data filed at 06/18/12 0900  Gross per 24 hour  Intake    360 ml  Output   2220 ml  Net  -1860 ml  160 UOP recorded.   Weight change: -3 lb 4.9 oz (-1.5 kg)    Lab Results:   Lab 06/18/12 0952 06/17/12 1108 06/16/12 0655 06/15/12 2107  NA 135 133* 139 --  K 3.9 3.5 3.3* --  CL 93* 94* 97 --  CO2 27 25 22  --  BUN 35* 43* 50* --  CREATININE 3.17* 3.39* 3.49* --  ALB -- -- -- --  GLUCOSE 216* -- -- --  CALCIUM 9.5 9.1 9.3 --  PHOS 3.5 3.8 -- 6.9*     Basename 06/17/12 1058 06/16/12 0655  WBC 9.6 8.1  HGB 9.8* 10.1*  HCT 29.3* 30.5*  PLT 287 283     I have reviewed the patient's current medications. Scheduled:    . allopurinol  50 mg Oral Daily  . amLODipine  5 mg Oral QPM  . atorvastatin  40 mg Oral Daily  . carvedilol  6.25 mg Oral BID WC  . darbepoetin (ARANESP) injection - DIALYSIS  100 mcg Intravenous Q Fri-HD  . doxercalciferol  1 mcg Intravenous Q T,Th,Sa-HD  . feeding supplement (NEPRO CARB STEADY)  237 mL Oral TID WC  . feeding supplement  1 Container Oral Q24H  . heparin  5,000 Units Subcutaneous Q8H  .  insulin aspart  0-9 Units Subcutaneous TID WC  . lisinopril  2.5 mg Oral Daily  . multivitamin  1 tablet Oral Daily  . sodium chloride  3 mL Intravenous Q12H    Assessment/Plan: 1.  Acute systolic CHF exacerbation with EF 25% refractory to medical management now requiring HD.   *Next HD Thursday.   *Appreciate Cardiology recommendations-continue beta blocker.   *appears plan is to start ACE-I. Would consider stopping amlodipine given last BP 109/61 and addition of new agent.  2. CKD Stage V-now likely ESRD. Received dialysis 1/6. For patient comfort will schedule xanax PO before dialysis.  *s/p tunnel HD cath. AVF in place 3 years and does not appear mature. Plan for VVS consult per patient request as she would like to hear her options from vascular surgeon.  3. Anemia-% saturation 16% on 12/12 now s/p 2 Ferraheme doses outpatient. No indication to repeat at this time. Hgb stable around baseline of 10. On Aranesp.  4. Secondary Hyperparathyroidism-PTH not elevated in December at 40.  Hectorol 1 mcg each HD 5. Gout-uric acid 6.8. Low dose allopurinol. .  6. HTN-controlled. continue beta blocker . Stopped spironolactone permanently given now on HD. d/ced hydralazine  to avoid hypotension. May need to stop amlodipine as well as ace-i started.   Other medical problems per primary team. 7. DM-per primary team  8. Hypoalbuminemia-albumin 2.8. Ordered Nepro TID with meals as patient can tolerate. Consider repeat albumin within a week.  9. Hyperlipidemia-continue statin    LOS: 6 days   Aldine Contes. Marti Sleigh, MD, PGY2 06/18/2012 12:23 PM

## 2012-06-19 ENCOUNTER — Telehealth: Payer: Self-pay | Admitting: Vascular Surgery

## 2012-06-19 LAB — RENAL FUNCTION PANEL
CO2: 25 mEq/L (ref 19–32)
Calcium: 9 mg/dL (ref 8.4–10.5)
GFR calc Af Amer: 9 mL/min — ABNORMAL LOW (ref >90)
Glucose, Bld: 188 mg/dL — ABNORMAL HIGH (ref 70–99)
Sodium: 132 mEq/L — ABNORMAL LOW (ref 135–145)

## 2012-06-19 LAB — CULTURE, BLOOD (ROUTINE X 2): Culture: NO GROWTH

## 2012-06-19 LAB — GLUCOSE, CAPILLARY
Glucose-Capillary: 137 mg/dL — ABNORMAL HIGH (ref 70–99)
Glucose-Capillary: 295 mg/dL — ABNORMAL HIGH (ref 70–99)

## 2012-06-19 MED ORDER — CAMPHOR-MENTHOL 0.5-0.5 % EX LOTN
1.0000 "application " | TOPICAL_LOTION | Freq: Three times a day (TID) | CUTANEOUS | Status: DC | PRN
  Filled 2012-06-19: qty 222

## 2012-06-19 MED ORDER — DOCUSATE SODIUM 283 MG RE ENEM
1.0000 | ENEMA | RECTAL | Status: DC | PRN
  Administered 2012-06-19 – 2012-06-20 (×2): 283 mg via RECTAL
  Filled 2012-06-19 (×2): qty 0.2

## 2012-06-19 MED ORDER — POLYETHYLENE GLYCOL 3350 17 G PO PACK
17.0000 g | PACK | Freq: Three times a day (TID) | ORAL | Status: DC
  Administered 2012-06-19 – 2012-06-20 (×2): 17 g via ORAL
  Filled 2012-06-19 (×4): qty 1

## 2012-06-19 MED ORDER — ACETAMINOPHEN 325 MG PO TABS
650.0000 mg | ORAL_TABLET | Freq: Four times a day (QID) | ORAL | Status: DC | PRN
  Administered 2012-06-19: 650 mg via ORAL
  Filled 2012-06-19: qty 2

## 2012-06-19 MED ORDER — SODIUM CHLORIDE 0.9 % IV BOLUS (SEPSIS)
250.0000 mL | Freq: Once | INTRAVENOUS | Status: AC
  Administered 2012-06-19: 250 mL via INTRAVENOUS

## 2012-06-19 MED ORDER — ONDANSETRON HCL 4 MG/2ML IJ SOLN: 4.0000 mg | Freq: Four times a day (QID) | INTRAMUSCULAR | Status: DC | PRN

## 2012-06-19 MED ORDER — CALCIUM CARBONATE 1250 MG/5ML PO SUSP: 500.0000 mg | Freq: Four times a day (QID) | ORAL | Status: DC | PRN

## 2012-06-19 MED ORDER — ONDANSETRON HCL 4 MG PO TABS: 4.0000 mg | ORAL_TABLET | Freq: Four times a day (QID) | ORAL | Status: DC | PRN

## 2012-06-19 MED ORDER — HYDROXYZINE HCL 25 MG PO TABS
25.0000 mg | ORAL_TABLET | Freq: Three times a day (TID) | ORAL | Status: DC | PRN
  Filled 2012-06-19: qty 1

## 2012-06-19 MED ORDER — NEPRO/CARBSTEADY PO LIQD: 237.0000 mL | Freq: Three times a day (TID) | ORAL | Status: DC | PRN

## 2012-06-19 MED ORDER — CARVEDILOL 3.125 MG PO TABS
3.1250 mg | ORAL_TABLET | Freq: Two times a day (BID) | ORAL | Status: DC
  Administered 2012-06-20: 3.125 mg via ORAL
  Filled 2012-06-19 (×4): qty 1

## 2012-06-19 MED ORDER — SORBITOL 70 % SOLN
30.0000 mL | Status: DC | PRN
  Administered 2012-06-19: 30 mL via ORAL
  Filled 2012-06-19: qty 30

## 2012-06-19 MED ORDER — ZOLPIDEM TARTRATE 5 MG PO TABS: 5.0000 mg | ORAL_TABLET | Freq: Every evening | ORAL | Status: DC | PRN

## 2012-06-19 MED ORDER — ACETAMINOPHEN 650 MG RE SUPP: 650.0000 mg | Freq: Four times a day (QID) | RECTAL | Status: DC | PRN

## 2012-06-19 MED ORDER — RENA-VITE PO TABS
1.0000 | ORAL_TABLET | Freq: Every day | ORAL | Status: DC
  Filled 2012-06-19: qty 1

## 2012-06-19 NOTE — Telephone Encounter (Addendum)
Message copied by Shari Prows on Thu Jun 19, 2012 10:29 AM ------      Message from: Fabienne Bruns E      Created: Wed Jun 18, 2012  4:49 PM       Level 4 consult      Sharl Ma renal requesting             Right arm fistula infiltration            Needs office visit in 2-3 weeks with duplex of right arm fistula and upper arm veins at that time to see if it is salvageable or needs new access            Leonette Most  I scheduled an appt for the above pt on 07/17/12 at 11:30am for the lab study and see CEF at 12:30pm. I mailed a letter to the patient and also lm for her.awt

## 2012-06-19 NOTE — Clinical Social Work Note (Signed)
Call made to patient's niece to give bed offers and message left. CSW will follow-up with Ms. Asheworth on 1/10.  Genelle Bal, MSW, LCSW [phone]

## 2012-06-19 NOTE — Progress Notes (Signed)
Physical Therapy Treatment Patient Details Name: Kimberly Norman MRN: [id number] DOB: 23-Nov-1918 Today's Date: 06/19/2012 Time: 7829-5621 PT Time Calculation (min): 25 min  PT Assessment / Plan / Recommendation Comments on Treatment Session  Pt. able to achieve rise to stand today with less physical assist.  BP dropped some with standing, sats on RA with walking stayed at 90 % or above.  Overall, making gradual gains.  Will benefit from ST SNF for ongoing rehab at time of DC from acute.    Follow Up Recommendations  SNF     Does the patient have the potential to tolerate intense rehabilitation     Barriers to Discharge        Equipment Recommendations  Rolling walker with 5" wheels    Recommendations for Other Services    Frequency Min 3X/week   Plan Frequency remains appropriate;Discharge plan remains appropriate    Precautions / Restrictions Precautions Precautions: Fall Precaution Comments: Significant fatigue with activity Restrictions Weight Bearing Restrictions: No   Pertinent Vitals/Pain Denies pain; supine BP 120/52, seated BP 99/49, standing BP 102/50.  O2 sats on 2L o2 96%, decreased to 90% on room air with short walk.    Mobility  Bed Mobility Bed Mobility: Supine to Sit;Sitting - Scoot to Edge of Bed Supine to Sit: 4: Min assist;HOB elevated;Other (comment);With rails (34 degrees) Sitting - Scoot to Edge of Bed: 4: Min guard Details for Bed Mobility Assistance: min assist for safety Transfers Transfers: Sit to Stand;Stand to Sit Sit to Stand: 4: Min assist;With upper extremity assist;From bed Stand to Sit: 4: Min assist;With upper extremity assist;To bed Details for Transfer Assistance: Less physical assist needed for sit to stand today than last session.  She was able to rely on LEs to assist herself more. Ambulation/Gait Ambulation/Gait Assistance: 4: Min assist Ambulation Distance (Feet): 20 Feet Assistive device: Rolling walker Ambulation/Gait Assistance  Details: Needed occasional assist to maneuver RW around objects.  Min assist for safety and stability. Gait Pattern: Decreased stride length;Trunk flexed Gait velocity: slowed    Exercises General Exercises - Lower Extremity Ankle Circles/Pumps: AROM;Both;10 reps;Seated Long Arc Quad: 5 reps;Seated;AROM Hip Flexion/Marching: AROM;5 reps;Both;Seated   PT Diagnosis:    PT Problem List:   PT Treatment Interventions:     PT Goals Acute Rehab PT Goals PT Goal: Supine/Side to Sit - Progress: Progressing toward goal PT Goal: Sit to Stand - Progress: Progressing toward goal PT Goal: Stand to Sit - Progress: Progressing toward goal PT Goal: Ambulate - Progress: Progressing toward goal  Visit Information  Last PT Received On: 06/19/12 Assistance Needed: +1    Subjective Data  Subjective: Pt. reports she had a bad night last night   Cognition  Overall Cognitive Status: Appears within functional limits for tasks assessed/performed Arousal/Alertness: Awake/alert Orientation Level: Appears intact for tasks assessed Behavior During Session: [facility] for tasks performed    Balance     End of Session PT - End of Session Equipment Utilized During Treatment: Gait belt Activity Tolerance: Patient limited by fatigue Patient left: in bed;with call bell/phone within reach;with family/visitor present Nurse Communication: Mobility status   GP     Ferman Hamming 06/19/2012, 10:00 AM Weldon Picking PT Acute Rehab Services [phone] Beeper [phone]

## 2012-06-19 NOTE — Progress Notes (Signed)
06/19/2012 11:14 AM Hemodialysis Outpatient Note; This patient has been accepted at the [facility] center on a Mon/Wed/Fri 2nd shift schedule. This patient can begin treatment on Monday 01.13.14 at 1100 AM. Thank you. Tilman Neat

## 2012-06-19 NOTE — Progress Notes (Signed)
[facility]  Subjective:  Awake, alert, resting in bed, little movement, appears exhausted.  Low BP overnight and patient states energy is drained. No desire for HD today and wants less BP medicine.    Objective: Vital signs in last 24 hours: Blood pressure 104/42, pulse 69, temperature 97.4 F (36.3 C), temperature source Oral, resp. rate 18, height 5\' 5"  (1.651 m), weight 127 lb 10.3 oz (57.9 kg), SpO2 96.00%.    PHYSICAL EXAM General--as above.  Chest--slightly diminished at bilateral bases.  Unlabored Heart--no rub Abd--nontender Extr--1+ pretib edema, RLE AVF patent but with ecchymosis noted around it. No pain. Fistula very soft (in place for 3 years now)    Intake/Output Summary (Last 24 hours) at 06/19/12 0958 Last data filed at 06/19/12 0802  Gross per 24 hour  Intake    580 ml  Output    300 ml  Net    280 ml  No UOP  Weight change: -4 lb 6.6 oz (-2 kg) Not recorded for today.     Lab Results:   Lab 06/19/12 0833 06/18/12 0952 06/17/12 1108  NA 132* 135 133*  K 4.2 3.9 3.5  CL 91* 93* 94*  CO2 25 27 25   BUN 55* 35* 43*  CREATININE 4.34* 3.17* 3.39*  ALB -- -- --  GLUCOSE 188* -- --  CALCIUM 9.0 9.5 9.1  PHOS 4.9* 3.5 3.8     Basename 06/17/12 1058  WBC 9.6  HGB 9.8*  HCT 29.3*  PLT 287     I have reviewed the patient's current medications. Scheduled:    . allopurinol  50 mg Oral Daily  . atorvastatin  40 mg Oral Daily  . carvedilol  3.125 mg Oral BID WC  . darbepoetin (ARANESP) injection - DIALYSIS  100 mcg Intravenous Q Fri-HD  . doxercalciferol  1 mcg Intravenous Q T,Th,Sa-HD  . feeding supplement (NEPRO CARB STEADY)  237 mL Oral TID WC  . feeding supplement  1 Container Oral Q24H  . heparin  5,000 Units Subcutaneous Q8H  . insulin aspart  0-9 Units Subcutaneous TID WC  . multivitamin  1 tablet Oral Daily  . polyethylene glycol  17 g Oral Daily  . sodium chloride  3 mL Intravenous Q12H    Assessment/Plan: 1.  Acute  systolic CHF exacerbation with EF 25% refractory to medical management now requiring HD.   *Next HD Friday. Deferred today due to patient preference and Hypotension overnight  *Did not receive HD yesterday and BP decreased with lisinopril despite no amlodipine yesterday, will d/c Lisinopril and decrease Carvedilol. Monitor BP.   2. CKD Stage V-now likely ESRD.  Benadryl before sessions. Plan for VVS f/u o/p as patient feels too weak for any procedures currently.  3. Anemia-% saturation 16% on 12/12 now s/p 2 Ferraheme doses outpatient. No indication to repeat at this time. Hgb stable around baseline of 10. Repeat tomorrow. On Aranesp.  4. Secondary Hyperparathyroidism-PTH not elevated in December at 40.  Hectorol 1 mcg each HD 5. Gout-uric acid 6.8. Low dose allopurinol. .  6. HTN-controlled but hypotensive overnight. continue beta blocker at decreased dose. Hold all BP meds but coreg.   Other medical problems per primary team. 7. DM-per primary team  8. Hypoalbuminemia-albumin 2.8. Ordered Nepro TID with meals as patient can tolerate. Consider repeat albumin within a week.  9. Hyperlipidemia-continue statin    LOS: 7 days   Aldine Contes. Marti Sleigh, MD, PGY2 06/19/2012 9:58 AM I have seen and examined this  patient and agree with the plan of care .Elvis Coil W 06/19/2012, 12:31 PM

## 2012-06-19 NOTE — Progress Notes (Signed)
PATIENT DETAILS Name: Kimberly Norman Age: 79 y.o. Sex: female Date of Birth: 1919/02/20 Admit Date: 06/12/2012 Admitting Physician Rhetta Mura, MD PCP:[name], Fayrene Fearing, MD  Brief Summary Patient is a 79 year old Caucasian female with a past medical history of stage IV chronic kidney disease, hypertension, dyslipidemia who presented to the emergency room on 1/24 worsening shortness of breath and cough that was going on for approximately 2 weeks prior to admission. She also had worsening lower extremity swelling. She was found to have decompensated CHF, a subsequent echocardiogram showed an ejection fraction decreased at 25%. Patient was admitted, and initially started on intravenous Lasix, unfortunately she still continued to be significantly volume overloaded. She already had a fistula placed in the right forearm prior to admission. Dialysis was attempted on 1/3, however her fistula was not able to be accessed. A temporary dialysis catheter was placed and the patient was subsequently dialyze 3 days in a row with the last session being on 1/7. She was also started on Coreg and lisinopril given her severe systolic heart failure. It is now felt that she is end-stage renal disease. Plans were for her to be transferred to a skilled nursing facility on 1/9 however she developed hypotension last night, it is felt that this hypotension is probably from being post hemodialysis (3 days in a row) and also from her antihypertensive therapy (Coreg and lisinopril). We have now stopped lisinopril, she was due for hemodialysis today, however given hypotension this has now been canceled. Current plans are to monitor her overnight, and reevaluate in the morning for need for hemodialysis, and possible transfer to a skilled nursing facility on 1/10.  Subjective: Hypotensive overnight. Currently feels very weak  Assessment/Plan: Principal Problem:  *Acute systolic heart failure (NYHA class II, ACC/AHA stage C) -more  compensated with HD -weight down to 57.9 kg from 64.8 kg on admission -appreciate cards evaluation -continue with coreg, given hypotension lisinopril has now been stopped. -Echo on 06/13/12-EF of 25 percent  Active Problems: Hypotension - This developed last night-much better this morning - Likely secondary to patient being post hemodialysis and antihypertensive therapy - No fever or toxic appearance to suggest developing sepsis - As noted above-only on Coreg, lisinopril has now been discontinued. - Will need another 24 more hours of monitoring before being transferred to a skilled nursing facility   ESRD (end stage renal disease) -likely now ESRD-HD per renal   DM (diabetes mellitus), type 2 with renal complications -CBG's controlled -c/w SSI   Hyponatremia - Very mild, hopefully will continue to resolve with hemodialysis and further volume control. - Does have systolic heart failure-hyponatremia signifies a poor prognosis   Hyperlipidemia -c/w Lipitor  HTN - Hypotensive last night-now only on Coreg-at 3.125 mg twice a day. Hopefully this will maintain her blood pressure well, monitor another 24 hours, and if BP stable, SNF on 1/10  Anemia -2/2 ESRD -on Aranesp  Gout -stable -c/w Allopurinol  Disposition: Remain inpatient-SNF on discharge  DVT Prophylaxis: Prophylactic Heparin  Code Status: Full code   Procedures:  HD  CONSULTS:  cardiology and nephrology  PHYSICAL EXAM: Vital signs in last 24 hours: Filed Vitals:   06/19/12 0600 06/19/12 0617 06/19/12 0751 06/19/12 1156  BP: 82/35 83/35 104/42 103/47  Pulse: 59  69 68  Temp:    97.6 F (36.4 C)  TempSrc:    Oral  Resp:  16 18 18   Height:      Weight:      SpO2:   96% 98%  Weight change: -2 kg (-4 lb 6.6 oz) Body mass index is 21.24 kg/(m^2).   Gen Exam: Awake and alert with clear speech.   Neck: Supple, No JVD.   Chest: B/L Clear.   CVS: S1 S2 Regular, no murmurs.  Abdomen: soft, BS +,  non tender, non distended.  Extremities: no edema, lower extremities warm to touch. Neurologic: Non Focal.   Skin: No Rash.   Wounds: N/A.   Intake/Output from previous day:  Intake/Output Summary (Last 24 hours) at 06/19/12 1224 Last data filed at 06/19/12 0802  Gross per 24 hour  Intake    340 ml  Output    300 ml  Net     40 ml     LAB RESULTS: CBC  Lab 06/17/12 1058 06/16/12 0655 06/15/12 2107 06/15/12 0958 06/13/12 0555 06/12/12 1901  WBC 9.6 8.1 9.7 9.3 9.7 --  HGB 9.8* 10.1* 9.4* 9.9* 9.8* --  HCT 29.3* 30.5* 28.6* 29.2* 29.5* --  PLT 287 283 286 284 248 --  MCV 94.8 94.4 93.5 93.3 94.6 --  MCH 31.7 31.3 30.7 31.6 31.4 --  MCHC 33.4 33.1 32.9 33.9 33.2 --  RDW 14.6 14.6 14.3 14.4 14.9 --  LYMPHSABS 1.2 -- -- -- -- 0.8  MONOABS 1.4* -- -- -- -- 1.0  EOSABS 0.2 -- -- -- -- 0.0  BASOSABS 0.0 -- -- -- -- 0.0  BANDABS -- -- -- -- -- --    Chemistries   Lab 06/19/12 0833 06/18/12 0952 06/17/12 1108 06/16/12 0655 06/15/12 2107  NA 132* 135 133* 139 131*  K 4.2 3.9 3.5 3.3* 3.6  CL 91* 93* 94* 97 92*  CO2 25 27 25 22 21   GLUCOSE 188* 216* 204* 173* 191*  BUN 55* 35* 43* 50* 82*  CREATININE 4.34* 3.17* 3.39* 3.49* 4.91*  CALCIUM 9.0 9.5 9.1 9.3 9.5  MG -- -- -- -- --    CBG:  Lab 06/19/12 1131 06/19/12 0743 06/18/12 2151 06/18/12 1702 06/18/12 1142  GLUCAP 137* 159* 122* 177* 167*    GFR Estimated Creatinine Clearance: 7.6 ml/min (by C-G formula based on Cr of 4.34).  Coagulation profile  Lab 06/13/12 0555  INR 1.13  PROTIME --    Cardiac Enzymes  Lab 06/12/12 1901  CKMB --  TROPONINI <0.30  MYOGLOBIN --    No components found with this basename: POCBNP:3 No results found for this basename: DDIMER:2 in the last 72 hours No results found for this basename: HGBA1C:2 in the last 72 hours No results found for this basename: CHOL:2,HDL:2,LDLCALC:2,TRIG:2,CHOLHDL:2,LDLDIRECT:2 in the last 72 hours No results found for this basename:  TSH,T4TOTAL,FREET3,T3FREE,THYROIDAB in the last 72 hours No results found for this basename: VITAMINB12:2,FOLATE:2,FERRITIN:2,TIBC:2,IRON:2,RETICCTPCT:2 in the last 72 hours No results found for this basename: LIPASE:2,AMYLASE:2 in the last 72 hours  Urine Studies No results found for this basename: UACOL:2,UAPR:2,USPG:2,UPH:2,UTP:2,UGL:2,UKET:2,UBIL:2,UHGB:2,UNIT:2,UROB:2,ULEU:2,UEPI:2,UWBC:2,URBC:2,UBAC:2,CAST:2,CRYS:2,UCOM:2,BILUA:2 in the last 72 hours  MICROBIOLOGY: Recent Results (from the past 240 hour(s))  CULTURE, BLOOD (ROUTINE X 2)     Status: Normal   Collection Time   06/12/12  6:55 PM      Component Value Range Status Comment   Specimen Description BLOOD ARM LEFT   Final    Special Requests BOTTLES DRAWN AEROBIC ONLY 10CC   Final    Culture  Setup Time 06/13/2012 01:34   Final    Culture NO GROWTH 5 DAYS   Final    Report Status 06/19/2012 FINAL   Final   CULTURE, BLOOD (ROUTINE X 2)  Status: Normal   Collection Time   06/12/12  7:02 PM      Component Value Range Status Comment   Specimen Description BLOOD HAND LEFT   Final    Special Requests BOTTLES DRAWN AEROBIC AND ANAEROBIC B 10CC R 5CC   Final    Culture  Setup Time 06/13/2012 01:35   Final    Culture NO GROWTH 5 DAYS   Final    Report Status 06/19/2012 FINAL   Final     RADIOLOGY STUDIES/RESULTS: Dg Chest 2 View  06/12/2012  *RADIOLOGY REPORT*  Clinical Data: Short of breath  CHEST - 2 VIEW  Comparison: 06/12/2012  Findings: Mild cardiac enlargement.  Stable appearance of the right pleural effusion with overlying atelectasis/consolidation.  Smaller left effusion appears similar to previous exam.  Mild multilevel spondylosis is identified within the thoracic spine.  IMPRESSION:  1.  No change from previous exam. 2.  Persistent moderate right pleural effusion and smaller left effusion.   Original Report Authenticated By: Signa Kell, M.D.    Ir Fluoro Guide Cv Line Right  06/15/2012  *RADIOLOGY REPORT*  Indication:  79 year old with end-stage renal disease and needs hemodialysis.  PROCEDURE(S): FLUOROSCOPIC AND ULTRASOUND GUIDED PLACEMENT OF A TUNNELED DIAYSIS CATHETER  Physician:  Rachelle Hora. Henn, MD  Medications: Versed 1 mg, Fentanyl 25 mcg. A radiology nurse monitored the patient for moderate sedation.  Ancef 1 gm.  As antibiotic prophylaxis, Ancef  was ordered pre-procedure and administered intravenously within one hour of incision.  Moderate sedation time: 20 minutes  Fluoroscopy time: 1.44minutes  Procedure:Informed consent was obtained for placement of a tunneled dialysis catheter.  The patient was placed supine on the interventional table.  Ultrasound confirmed a patent right internal jugularvein.  Ultrasound images were obtained for documentation. The right side of the neck was prepped and draped in a sterile fashion.  The right side of the neck was anesthetized with 1% lidocaine.  Maximal barrier sterile technique was utilized including caps, mask, sterile gowns, sterile gloves, sterile drape, hand hygiene and skin antiseptic.  A small incision was made with #11 blade scalpel.  A 21 gauge needle directed into the right internal jugular vein with ultrasound guidance.  A micropuncture dilator set was placed.  A 23 cm tip to cuff HemoSplit catheter was selected.  The skin below the right clavicle was anesthetized and a small incision was made with a #11 blade scalpel.  A subcutaneous tunnel was formed to the vein dermatotomy site.  The catheter was brought through the tunnel.  The vein dermatotomy site was dilated to accommodate a peel-away sheath.  The catheter was placed through the peel-away sheath and directed into the central venous structures.  The tip of the catheter was placed at the junction of the SVC and right atrium with fluoroscopy.  Fluoroscopic images were obtained for documentation.  Both lumens were found to aspirate and flush well.  The proper amount of heparin was flushed in both lumens.  The vein  dermatotomy site was closed using a single layer of absorbable suture and Dermabond.  The catheter was secured to the skin using Prolene suture.  Findings:Catheter tip at the junction of the SVC and right atrium. Moderate sized right pleural effusion.  Complications: None  Impression:Successful placement of a right jugular tunneled dialysis catheter using ultrasound and fluoroscopic guidance.   Original Report Authenticated By: Richarda Overlie, M.D.    Ir US Guide Vasc Access Right  06/15/2012  *RADIOLOGY REPORT*  Indication: 79 year old with end-stage  renal disease and needs hemodialysis.  PROCEDURE(S): FLUOROSCOPIC AND ULTRASOUND GUIDED PLACEMENT OF A TUNNELED DIAYSIS CATHETER  Physician:  Rachelle Hora. Henn, MD  Medications: Versed 1 mg, Fentanyl 25 mcg. A radiology nurse monitored the patient for moderate sedation.  Ancef 1 gm.  As antibiotic prophylaxis, Ancef  was ordered pre-procedure and administered intravenously within one hour of incision.  Moderate sedation time: 20 minutes  Fluoroscopy time: 1.19minutes  Procedure:Informed consent was obtained for placement of a tunneled dialysis catheter.  The patient was placed supine on the interventional table.  Ultrasound confirmed a patent right internal jugularvein.  Ultrasound images were obtained for documentation. The right side of the neck was prepped and draped in a sterile fashion.  The right side of the neck was anesthetized with 1% lidocaine.  Maximal barrier sterile technique was utilized including caps, mask, sterile gowns, sterile gloves, sterile drape, hand hygiene and skin antiseptic.  A small incision was made with #11 blade scalpel.  A 21 gauge needle directed into the right internal jugular vein with ultrasound guidance.  A micropuncture dilator set was placed.  A 23 cm tip to cuff HemoSplit catheter was selected.  The skin below the right clavicle was anesthetized and a small incision was made with a #11 blade scalpel.  A subcutaneous tunnel was formed to  the vein dermatotomy site.  The catheter was brought through the tunnel.  The vein dermatotomy site was dilated to accommodate a peel-away sheath.  The catheter was placed through the peel-away sheath and directed into the central venous structures.  The tip of the catheter was placed at the junction of the SVC and right atrium with fluoroscopy.  Fluoroscopic images were obtained for documentation.  Both lumens were found to aspirate and flush well.  The proper amount of heparin was flushed in both lumens.  The vein dermatotomy site was closed using a single layer of absorbable suture and Dermabond.  The catheter was secured to the skin using Prolene suture.  Findings:Catheter tip at the junction of the SVC and right atrium. Moderate sized right pleural effusion.  Complications: None  Impression:Successful placement of a right jugular tunneled dialysis catheter using ultrasound and fluoroscopic guidance.   Original Report Authenticated By: Richarda Overlie, M.D.     MEDICATIONS: Scheduled Meds:    . allopurinol  50 mg Oral Daily  . atorvastatin  40 mg Oral Daily  . carvedilol  3.125 mg Oral BID WC  . darbepoetin (ARANESP) injection - DIALYSIS  100 mcg Intravenous Q Fri-HD  . doxercalciferol  1 mcg Intravenous Q T,Th,Sa-HD  . feeding supplement (NEPRO CARB STEADY)  237 mL Oral TID WC  . feeding supplement  1 Container Oral Q24H  . heparin  5,000 Units Subcutaneous Q8H  . insulin aspart  0-9 Units Subcutaneous TID WC  . multivitamin  1 tablet Oral Daily  . polyethylene glycol  17 g Oral Daily  . sodium chloride  3 mL Intravenous Q12H   Continuous Infusions:  PRN Meds:.acetaminophen, albuterol, ALPRAZolam, diphenhydrAMINE, docusate sodium, ondansetron (ZOFRAN) IV  Antibiotics: Anti-infectives    None       Jeoffrey Massed, MD  [facility] Pager:[phone]  If 7PM-7AM, please contact night-coverage [email or web address] Password [email or web address] 06/19/2012, 12:24 PM   LOS: 7 days

## 2012-06-19 NOTE — Progress Notes (Signed)
Subjective:  Feels weak this am after problems with low BP during the night. Required saline boluses.  Objective:  Vital Signs in the last 24 hours: Temp:  [97.4 F (36.3 C)-98 F (36.7 C)] 97.4 F (36.3 C) (01/09 0519) Pulse Rate:  [55-83] 69  (01/09 0751) Resp:  [16-22] 18  (01/09 0751) BP: (81-123)/(35-69) 104/42 mmHg (01/09 0751) SpO2:  [95 %-100 %] 96 % (01/09 0751) Weight:  [127 lb 10.3 oz (57.9 kg)] 127 lb 10.3 oz (57.9 kg) (01/08 2145)  Intake/Output from previous day: 01/08 0701 - 01/09 0700 In: 700 [P.O.:700] Out: -  Intake/Output from this shift: Total I/O In: -  Out: 300 [Urine:300]     . allopurinol  50 mg Oral Daily  . atorvastatin  40 mg Oral Daily  . carvedilol  6.25 mg Oral BID WC  . darbepoetin (ARANESP) injection - DIALYSIS  100 mcg Intravenous Q Fri-HD  . doxercalciferol  1 mcg Intravenous Q T,Th,Sa-HD  . feeding supplement (NEPRO CARB STEADY)  237 mL Oral TID WC  . feeding supplement  1 Container Oral Q24H  . heparin  5,000 Units Subcutaneous Q8H  . insulin aspart  0-9 Units Subcutaneous TID WC  . lisinopril  2.5 mg Oral Daily  . multivitamin  1 tablet Oral Daily  . polyethylene glycol  17 g Oral Daily  . sodium chloride  3 mL Intravenous Q12H      Physical Exam: The patient appears to be in no distress. General: Well developed, well nourished elderly WF in no acute distress.  Head: Normocephalic, atraumatic, sclera non-icteric, no xanthomas, nares are without discharge.  Neck: Negative for carotid bruits. JVD not elevated.  Lungs: Breath sounds diminished at right base. Heart: Regular rhythm with occasional ectopy, controlled rate, with S1 S2. No murmurs, rubs, or gallops appreciated.  Abdomen: Soft, non-tender, non-distended with normoactive bowel sounds. No hepatomegaly. No rebound/guarding. No obvious abdominal masses.  Msk: Strength and tone appear normal for age.  Extremities: No clubbing or cyanosis. Mild pretibial edema. No erythema.  Distal pedal pulses are 2+ and equal bilaterally.  Neuro: Alert and oriented X 3. No facial asymmetry. No focal deficit. Moves all extremities spontaneously.  Psych: Responds to questions appropriately with a normal affect.     Lab Results:  Basename 06/17/12 1058  WBC 9.6  HGB 9.8*  PLT 287    Basename 06/18/12 0952 06/17/12 1108  NA 135 133*  K 3.9 3.5  CL 93* 94*  CO2 27 25  GLUCOSE 216* 204*  BUN 35* 43*  CREATININE 3.17* 3.39*   No results found for this basename: TROPONINI:2,CK,MB:2 in the last 72 hours Hepatic Function Panel  Basename 06/18/12 0952  PROT --  ALBUMIN 2.8*  AST --  ALT --  ALKPHOS --  BILITOT --  BILIDIR --  IBILI --   No results found for this basename: CHOL in the last 72 hours No results found for this basename: PROTIME in the last 72 hours  Imaging: Imaging results have been reviewed  Cardiac Studies: Telemetry shows normal sinus rhythm with occasional premature atrial beats Assessment/Plan:  Patient Active Hospital Problem List:  acute systolic CHF (NYHA class II, ACC/AHA stage C) (06/12/2012)   Assessment: Improved since hemodialysis.  Weight is down.   Plan: Continue amlodipine carvedilol and hydralazine.  Tolerating carvedilol  6.25 mg twice a day.   HTN (hypertension) (06/12/2012)   Assessment: Satisfactory control on current therapy.   Plan: Low BP last night.  Now off  amlodipine so should be able to tolerate low dose ACE as long as she is not volume depleted.   LOS: 7 days    Cassell Clement 06/19/2012, 8:21 AM

## 2012-06-19 NOTE — Progress Notes (Signed)
Pt. Has been complaining of constipation,MD. Was call around noon time and an order for sorbitol was given.Sorbitol was administered around 1330.pt's daughter asked for enema,that was given around 1500.pt. Still unable to move her bowel.colace PO. Was offered,pt. Refused.keep monitoring pt. And assessing her needs.

## 2012-06-19 NOTE — Progress Notes (Signed)
Patient c/o impaction and other medications to assist did not help.  The patient's bp was running low and patient weak.  Dr Jerral Ralph aware of impaction and patient's bp, and orders received.  Patient very gently disimpacted for hard stool that was reachable.  There was more stool noted but too high up for me to reach.  The plan with the patient and daughter was to get oob to the [facility] after dinner to try to pass the stool again.   Patient tolerated procedure well.  Continue to monitor.

## 2012-06-20 ENCOUNTER — Other Ambulatory Visit: Payer: Self-pay | Admitting: *Deleted

## 2012-06-20 DIAGNOSIS — R531 Weakness: Secondary | ICD-10-CM | POA: Diagnosis present

## 2012-06-20 DIAGNOSIS — Z4931 Encounter for adequacy testing for hemodialysis: Secondary | ICD-10-CM

## 2012-06-20 DIAGNOSIS — F419 Anxiety disorder, unspecified: Secondary | ICD-10-CM | POA: Diagnosis present

## 2012-06-20 DIAGNOSIS — N186 End stage renal disease: Secondary | ICD-10-CM

## 2012-06-20 DIAGNOSIS — F411 Generalized anxiety disorder: Secondary | ICD-10-CM

## 2012-06-20 LAB — RENAL FUNCTION PANEL
CO2: 21 mEq/L (ref 19–32)
Calcium: 8.9 mg/dL (ref 8.4–10.5)
Chloride: 89 mEq/L — ABNORMAL LOW (ref 96–112)
GFR calc Af Amer: 8 mL/min — ABNORMAL LOW (ref >90)
GFR calc non Af Amer: 7 mL/min — ABNORMAL LOW (ref >90)
Glucose, Bld: 126 mg/dL — ABNORMAL HIGH (ref 70–99)
Potassium: 4.2 mEq/L (ref 3.5–5.1)
Sodium: 128 mEq/L — ABNORMAL LOW (ref 135–145)

## 2012-06-20 LAB — GLUCOSE, CAPILLARY
Glucose-Capillary: 118 mg/dL — ABNORMAL HIGH (ref 70–99)
Glucose-Capillary: 148 mg/dL — ABNORMAL HIGH (ref 70–99)

## 2012-06-20 LAB — CBC
HCT: 29.9 % — ABNORMAL LOW (ref 36.0–46.0)
Hemoglobin: 10.1 g/dL — ABNORMAL LOW (ref 12.0–15.0)
MCHC: 33.8 g/dL (ref 30.0–36.0)
WBC: 11 10*3/uL — ABNORMAL HIGH (ref 4.0–10.5)

## 2012-06-20 MED ORDER — CARVEDILOL 3.125 MG PO TABS: 3.1250 mg | ORAL_TABLET | Freq: Two times a day (BID) | ORAL | Status: DC

## 2012-06-20 MED ORDER — HYDROXYZINE HCL 25 MG PO TABS: 25.0000 mg | ORAL_TABLET | Freq: Three times a day (TID) | ORAL | Status: DC | PRN

## 2012-06-20 MED ORDER — INSULIN ASPART 100 UNIT/ML ~~LOC~~ SOLN: 0.0000 [IU] | Freq: Three times a day (TID) | SUBCUTANEOUS | Status: DC

## 2012-06-20 MED ORDER — LORAZEPAM 0.5 MG PO TABS: 0.5000 mg | ORAL_TABLET | Freq: Every day | ORAL | Status: DC | PRN

## 2012-06-20 MED ORDER — ZOLPIDEM TARTRATE 5 MG PO TABS: 5.0000 mg | ORAL_TABLET | Freq: Every evening | ORAL | Status: DC | PRN

## 2012-06-20 MED ORDER — NEPRO/CARBSTEADY PO LIQD: 237.0000 mL | Freq: Three times a day (TID) | ORAL | Status: DC

## 2012-06-20 MED ORDER — DARBEPOETIN ALFA-POLYSORBATE 100 MCG/0.5ML IJ SOLN
INTRAMUSCULAR | Status: AC
  Filled 2012-06-20: qty 0.5

## 2012-06-20 MED ORDER — POLYETHYLENE GLYCOL 3350 17 G PO PACK: 17.0000 g | PACK | Freq: Three times a day (TID) | ORAL | Status: DC

## 2012-06-20 MED ORDER — RENA-VITE PO TABS: 1.0000 | ORAL_TABLET | Freq: Every day | ORAL | Status: DC

## 2012-06-20 MED ORDER — ACETAMINOPHEN 325 MG PO TABS: 650.0000 mg | ORAL_TABLET | Freq: Four times a day (QID) | ORAL | Status: DC | PRN

## 2012-06-20 MED ORDER — ALLOPURINOL 100 MG PO TABS: 50.0000 mg | ORAL_TABLET | Freq: Every day | ORAL | Status: DC

## 2012-06-20 NOTE — Progress Notes (Signed)
Pt's BP 98/42 manually.MD.was informed.keep monitoring pt. Closely.and assessing her needs.

## 2012-06-20 NOTE — Progress Notes (Signed)
Patient complained of difficulty passing stool. Rectal vault checked with some formed stool noted. Enemeez prn given at 0548 and patient assisted to [facility]. Patient then complained of being too weak to push stool out. Writer checked rectal vault and noted stool hanging at tip of anus. Assisted by digitally removing stool at tip and patient was able to push rest out. Voiced relief.

## 2012-06-20 NOTE — Progress Notes (Signed)
PT Cancellation Note  Patient Details Name: Kimberly Norman MRN: [id number] DOB: 09-23-1918   Cancelled Treatment:    Reason Eval/Treat Not Completed: Patient at procedure or test/unavailable;Other (comment) (pt. just in to HD, will not be able to see today)  She is scheduled to to come off at 3:30 pm and will need rest/recovery time post HD.   Ferman Hamming 06/20/2012, 12:32 PM Weldon Picking PT [facility] Re[facility] [phone] [place] [phone]

## 2012-06-20 NOTE — Progress Notes (Signed)
I have seen and examined this patient and agree with the plan of care, patient seen on dialysis. BP 129/80  UF goal 1900 no edema  Kimberly Norman W 06/20/2012, 12:54 PM

## 2012-06-20 NOTE — Progress Notes (Signed)
Subjective:  The patient appears to have had a better night last evening.  No further episodes of marked hypotension. Rhythm stable normal sinus rhythm with PACs  Objective:  Vital Signs in the last 24 hours: Temp:  [97.4 F (36.3 C)-98.5 F (36.9 C)] 98.5 F (36.9 C) (01/10 0924) Pulse Rate:  [57-72] 70  (01/10 0924) Resp:  [16-18] 18  (01/10 0924) BP: (96-133)/(40-51) 96/47 mmHg (01/10 0924) SpO2:  [96 %-100 %] 96 % (01/10 0924) Weight:  [126 lb 8.7 oz (57.4 kg)] 126 lb 8.7 oz (57.4 kg) (01/09 2109)  Intake/Output from previous day: 01/09 0701 - 01/10 0700 In: -  Out: 300 [Urine:300] Intake/Output from this shift:       . allopurinol  50 mg Oral Daily  . atorvastatin  40 mg Oral Daily  . carvedilol  3.125 mg Oral BID WC  . darbepoetin (ARANESP) injection - DIALYSIS  100 mcg Intravenous Q Fri-HD  . doxercalciferol  1 mcg Intravenous Q T,Th,Sa-HD  . feeding supplement (NEPRO CARB STEADY)  237 mL Oral TID WC  . feeding supplement  1 Container Oral Q24H  . heparin  5,000 Units Subcutaneous Q8H  . insulin aspart  0-9 Units Subcutaneous TID WC  . multivitamin  1 tablet Oral QHS  . polyethylene glycol  17 g Oral TID  . sodium chloride  3 mL Intravenous Q12H      Physical Exam: The patient appears to be in no distress. General: Well developed, well nourished elderly WF in no acute distress.  Head: Normocephalic, atraumatic, sclera non-icteric, no xanthomas, nares are without discharge.  Neck: Negative for carotid bruits. JVD not elevated.  Lungs: Breath sounds diminished at right base. Heart: Regular rhythm with occasional ectopy, controlled rate, with S1 S2. No murmurs, rubs, or gallops appreciated.  Abdomen: Soft, non-tender, non-distended with normoactive bowel sounds. No hepatomegaly. No rebound/guarding. No obvious abdominal masses.  Msk: Strength and tone appear normal for age.  Extremities: No clubbing or cyanosis. Mild pretibial edema. No erythema. Distal pedal  pulses are 2+ and equal bilaterally.  Neuro: Alert and oriented X 3. No facial asymmetry. No focal deficit. Moves all extremities spontaneously.  Psych: Responds to questions appropriately with a normal affect.     Lab Results:  Basename 06/20/12 0655 06/17/12 1058  WBC 11.0* 9.6  HGB 10.1* 9.8*  PLT 291 287    Basename 06/20/12 0655 06/19/12 0833  NA 128* 132*  K 4.2 4.2  CL 89* 91*  CO2 21 25  GLUCOSE 126* 188*  BUN 68* 55*  CREATININE 4.82* 4.34*   No results found for this basename: TROPONINI:2,CK,MB:2 in the last 72 hours Hepatic Function Panel  Basename 06/20/12 0655  PROT --  ALBUMIN 2.4*  AST --  ALT --  ALKPHOS --  BILITOT --  BILIDIR --  IBILI --   No results found for this basename: CHOL in the last 72 hours No results found for this basename: PROTIME in the last 72 hours  Imaging: Imaging results have been reviewed  Cardiac Studies: Telemetry shows normal sinus rhythm with occasional premature atrial beats Assessment/Plan:  Patient Active Hospital Problem List:  acute systolic CHF (NYHA class II, ACC/AHA stage C) (06/12/2012)   Assessment: Improved since hemodialysis.  Weight is stable over past 3 days    Plan: Continue  carvedilol . Tolerating carvedilol  3.125 mg twice a day.  Lisinopril was stopped yesterday     LOS: 8 days    Cassell Clement 06/20/2012, 10:07  AM    

## 2012-06-20 NOTE — Progress Notes (Signed)
I have seen and examined this patient and agree with the plan of care   Kimberly Norman W 06/20/2012, 1:07 PM   

## 2012-06-20 NOTE — Progress Notes (Signed)
Report was call to [facility] skill facility, to [facility].IV was removed,tele was d/c.pt. Ready to transfer to facility.

## 2012-06-20 NOTE — Progress Notes (Signed)
[facility]  Subjective:  Awake, alert, resting in bed, little movement, appears exhausted.  Patient states too weak for HD today. No increasing SOB.   Objective: Vital signs in last 24 hours: Blood pressure 98/42, pulse 70, temperature 98.5 F (36.9 C), temperature source Oral, resp. rate 18, height 5\' 5"  (1.651 m), weight 126 lb 8.7 oz (57.4 kg), SpO2 96.00%.    PHYSICAL EXAM General--as above.  Chest--slightly diminished at bilateral bases.  Unlabored Heart--no rub Abd--nontender Extr--1+ pretib edema, RLE AVF patent but with ecchymosis noted around it. No pain. Fistula very soft (in place for 3 years now)   No intake or output data in the 24 hours ending 06/20/12 1046 300UOP  Weight change: -1 lb 1.6 oz (-0.5 kg) Not recorded for today.     Lab Results:   Lab 06/20/12 0655 06/19/12 0833 06/18/12 0952  NA 128* 132* 135  K 4.2 4.2 3.9  CL 89* 91* 93*  CO2 21 25 27   BUN 68* 55* 35*  CREATININE 4.82* 4.34* 3.17*  ALB -- -- --  GLUCOSE 126* -- --  CALCIUM 8.9 9.0 9.5  PHOS 5.8* 4.9* 3.5     Basename 06/20/12 0655 06/17/12 1058  WBC 11.0* 9.6  HGB 10.1* 9.8*  HCT 29.9* 29.3*  PLT 291 287     I have reviewed the patient's current medications. Scheduled:    . allopurinol  50 mg Oral Daily  . atorvastatin  40 mg Oral Daily  . carvedilol  3.125 mg Oral BID WC  . darbepoetin (ARANESP) injection - DIALYSIS  100 mcg Intravenous Q Fri-HD  . doxercalciferol  1 mcg Intravenous Q T,Th,Sa-HD  . feeding supplement (NEPRO CARB STEADY)  237 mL Oral TID WC  . feeding supplement  1 Container Oral Q24H  . heparin  5,000 Units Subcutaneous Q8H  . insulin aspart  0-9 Units Subcutaneous TID WC  . multivitamin  1 tablet Oral QHS  . polyethylene glycol  17 g Oral TID  . sodium chloride  3 mL Intravenous Q12H    Assessment/Plan: 1.  Acute systolic CHF exacerbation with EF 25% refractory to medical management now requiring HD.   *Defer HD again due to  weakness.   * will discuss with patient and family today if they would be agreeable to palliative consult given patients continued difficulty with HD>  2. CKD Stage V-now likely ESRD.  Benadryl before sessions. Plan for [facility] f/u o/p as patient feels too weak for any procedures currently.  3. Anemia-% saturation 16% on 12/12 now s/p 2 Ferraheme doses outpatient. No indication to repeat at this time. Hgb stable around baseline of 10.  On Aranesp.  4. Secondary Hyperparathyroidism-PTH not elevated in December at 40.  Hectorol 1 mcg each HD 5. Gout-uric acid 6.8. Low dose allopurinol. .  6. HTN-BP still slightly low 98/42. Monitor and bolus as needed. controlled but hypotensive overnight. continue beta blocker at decreased dose. Hold all BP meds but coreg.   Other medical problems per primary team. 7. DM-per primary team  8. Hypoalbuminemia-albumin 2.8. Ordered Nepro TID with meals as patient can tolerate. Consider repeat albumin within a week.  9. Hyperlipidemia-continue statin    LOS: 8 days   Aldine Contes. Marti Sleigh, MD, PGY2 06/20/2012 10:45 AM

## 2012-06-20 NOTE — Discharge Summary (Signed)
Physician Discharge Summary  MORELIA CASSELLS JXB:[id number] DOB: 24-Aug-1918 DOA: 06/12/2012  PCP: [name], MD  Admit date: 06/12/2012 Discharge date: 06/20/2012  Time spent: 35 minutes  Recommendations for Outpatient Follow-up:  1. Patient is being discharged to skilled nursing facility. 2. Her next hemodialysis session is scheduled for Monday 1/13  Discharge Diagnoses:  Principal Problem:  *new onset CHF (NYHA class II, ACC/AHA stage C) Active Problems:  ESRD (end stage renal disease)  DM (diabetes mellitus), type 2 with renal complications  Hyponatremia  Hyperlipidemia  Gout  HTN (hypertension)  Anxiety  Weakness   Discharge Condition: Improvement in pulmonary function, being discharged to skilled nursing facility  Diet recommendation: Low sodium heart healthy  Filed Weights   06/17/12 2036 06/18/12 2145 06/19/12 2109  Weight: 56.7 kg (125 lb) 57.9 kg (127 lb 10.3 oz) 57.4 kg (126 lb 8.7 oz)    History of present illness:  Patient is a 79 year old white female past no history of diabetes, hypertension and hyperlipidemia who has near end-stage renal disease and even had a fistula placed in her right forearm who presented with worsening shortness of breath. Patient's felt that her breathing had been 1 going downhill for the last few months, but is especially bad in the past week. She was evaluated in the emergency room and found to be in secondary decompensated CHF. In addition, her creatinine had increased from 3.8-4.5. Nephrology and cardiology were consulted. Patient was started on IV Lasix and admitted to the hospitalist service.  Hospital Course:  Principal Problem:  *Acute systolic heart failure (NYHA class II, ACC/AHA stage C)  In the first 3 days, patient responded somewhat to Lasix but was still significantly volume overloaded. Dialysis was attempted on 1/3, however her fistula was not able to be accessed. A temporary dialysis catheter was placed and the patient  underwent dialysis on the evening of 1/5. Nephrology planned for a total of 3 dialysis sessions over the course of 3 days. After 2 sessions, the patient is down 13 pounds and minus almost 5 L of fluid. By the third session,more compensated with HD & weight down to 57.9 kg from 64.8 kg on admission   -Her home Coreg dose was decreased to 3.125 by mouth twice a day, which was not able to tolerate an ACE/ARB because of hypotension continue with coreg, given hypotension lisinopril has now been stopped.  -Echo on 06/13/12-EF of 25 percent   Hypotension  - Developed 1/8-much better this morning  - Likely secondary to patient being post hemodialysis and antihypertensive therapy  - No fever or toxic appearance to suggest developing sepsis  - As noted above-only on Coreg, lisinopril has now been discontinued.   ESRD (end stage renal disease)  -likely now ESRD-HD per renal , is getting dialysis today, 1/10. Will be discharged after to skilled nursing facility with plans for next dialysis session on Monday 1/13.  We'll plan to keep in temporary dialysis catheter. Patient was evaluated by vascular surgery. Patient said she was too weak to tolerate any large vascular procedures right now. Plan will be for her to follow up with vascular surgery as outpatient in 2 weeks.  DM (diabetes mellitus), type 2 with renal complications  Plan will be for out patient sliding scale-sensitive. Oral medications have been discontinued.  Hyponatremia  - Very mild, hopefully will continue to resolve with hemodialysis and further volume control.  - Does have systolic heart failure-signifies a poor prognosis, and is from hypervolemic hyponatremia.  By day of  discharge, sodium 128.  Hyperlipidemia  -c/w Lipitor   HTN  - Hypotensive last night-now only on Coreg-at 3.125 mg twice a day. Hopefully this will maintain her blood pressure well, monitor another 24 hours, and if BP stable, SNF on 1/10  Anemia  -2/2 ESRD  -on  Aranesp  Gout  -stable  -c/w Allopurinol  Procedures: Echocardiogram done-global hypokinesis. Grade 1 diastolic dysfunction. Ejection fraction 25%. Pulmonary hypertension-moderate  Consultations:  Richarda Overlie, interventional radiology  Charles fields, vascular surgery  Cassell Clement, cardiology  Elvis Coil, nephrology  Discharge Exam: Filed Vitals:   06/19/12 2109 06/20/12 0520 06/20/12 0924 06/20/12 1006  BP: 112/49 133/51 96/47 98/42   Pulse: 57 71 70   Temp: 97.4 F (36.3 C) 97.7 F (36.5 C) 98.5 F (36.9 C)   TempSrc: Oral Oral Oral   Resp: 16 18 18    Height:      Weight: 57.4 kg (126 lb 8.7 oz)     SpO2: 97% 96% 96%     General: Fatigued, mild distress secondary to fatigue, alert and oriented x3 Cardiovascular: Regular rate and rhythm, S1-S2, 2/6 systolic ejection murmur Respiratory: Clear to auscultation bilaterally, poor inspiratory effort Abdomen: Soft, nontender, nondistended, positive bowel sounds Extremities: No clubbing or cyanosis, trace pitting edema  Discharge Instructions  Discharge Orders    Future Appointments: Provider: Department: Dept Phone: Center:   07/17/2012 11:30 AM [facility] [phone] [facility]   07/17/2012 12:30 PM Sherren Kerns, MD [facility] [phone] [facility]     Future Orders Please Complete By Expires   Diet - low sodium heart healthy      Increase activity slowly          Medication List     As of 06/20/2012 12:06 PM    STOP taking these medications         amLODipine 5 MG tablet   Commonly known as: NORVASC      furosemide 40 MG tablet   Commonly known as: LASIX      glimepiride 2 MG tablet   Commonly known as: AMARYL      hydrALAZINE 50 MG tablet   Commonly known as: APRESOLINE      multivitamins ther. w/minerals Tabs      sitaGLIPtin 50 MG tablet   Commonly known as: JANUVIA      spironolactone 25 MG tablet   Commonly known as:  ALDACTONE      TAKE these medications         acetaminophen 325 MG tablet   Commonly known as: TYLENOL   Take 2 tablets (650 mg total) by mouth every 6 (six) hours as needed (or Fever >/= 101).      albuterol 108 (90 BASE) MCG/ACT inhaler   Commonly known as: PROVENTIL HFA;VENTOLIN HFA   Inhale 2 puffs into the lungs every 6 (six) hours as needed. For shortness of breath      allopurinol 100 MG tablet   Commonly known as: ZYLOPRIM   Take 0.5 tablets (50 mg total) by mouth daily.      atorvastatin 40 MG tablet   Commonly known as: LIPITOR   Take 40 mg by mouth daily.      calcitRIOL 0.5 MCG capsule   Commonly known as: ROCALTROL   Take 0.5 mcg by mouth daily.      carvedilol 3.125 MG tablet   Commonly known as: COREG   Take 1 tablet (3.125 mg total) by mouth  2 (two) times daily with a meal.      feeding supplement (NEPRO CARB STEADY) Liqd   Take 237 mLs by mouth 3 (three) times daily with meals.      hydrOXYzine 25 MG tablet   Commonly known as: ATARAX/VISTARIL   Take 1 tablet (25 mg total) by mouth every 8 (eight) hours as needed for itching.      insulin aspart 100 UNIT/ML injection   Commonly known as: novoLOG   Inject 0-9 Units into the skin 3 (three) times daily with meals. CBG: 151-200: 1 unit  210-250: 2 units  251-300: 3 units  301-350: 4 units  351-400: 5 units      LORazepam 0.5 MG tablet   Commonly known as: ATIVAN   Take 1 tablet (0.5 mg total) by mouth daily as needed. For anxiety caused by shortness of breath      multivitamin Tabs tablet   Take 1 tablet by mouth at bedtime.      polyethylene glycol packet   Commonly known as: MIRALAX / GLYCOLAX   Take 17 g by mouth 3 (three) times daily.      zolpidem 5 MG tablet   Commonly known as: AMBIEN   Take 1 tablet (5 mg total) by mouth at bedtime as needed for sleep (Insomnia).           Follow-up Information    Follow up with [name], MD. Schedule an appointment as soon as possible for a  visit in 1 month.   Contact information:   [address] [place] [address] [phone]       Follow up with Cassell Clement, MD. Schedule an appointment as soon as possible for a visit in 1 month.   Contact information:   [address] [place] [address] [phone]       Follow up with Next HD session on Monday.      Follow up with Sherren Kerns, MD. Schedule an appointment as soon as possible for a visit in 2 weeks. (For long term dialysis access)    Contact information:   [address] [place] [address] [phone]           The results of significant diagnostics from this hospitalization (including imaging, microbiology, ancillary and laboratory) are listed below for reference.    Significant Diagnostic Studies: Dg Chest 2 View  06/12/2012  IMPRESSION:  1.  No change from previous exam. 2.  Persistent moderate right pleural effusion and smaller left effusion.   Original Report Authenticated By: Signa Kell, M.D.     Ir US Guide Vasc Access Right  06/15/2012 Impression:Successful placement of a right jugular tunneled dialysis catheter using ultrasound and fluoroscopic guidance.   Original Report Authenticated By: Richarda Overlie, M.D.     Microbiology: Recent Results (from the past 240 hour(s))  CULTURE, BLOOD (ROUTINE X 2)     Status: Normal   Collection Time   06/12/12  6:55 PM      Component Value Range Status Comment   Specimen Description BLOOD ARM LEFT   Final    Special Requests BOTTLES DRAWN AEROBIC ONLY 10CC   Final    Culture  Setup Time 06/13/2012 01:34   Final    Culture NO GROWTH 5 DAYS   Final    Report Status 06/19/2012 FINAL   Final   CULTURE, BLOOD (ROUTINE X 2)     Status: Normal   Collection Time   06/12/12  7:02 PM      Component Value Range Status Comment   Specimen Description BLOOD HAND LEFT   Final    Special Requests BOTTLES DRAWN AEROBIC AND ANAEROBIC B 10CC R 5CC   Final    Culture  Setup Time  06/13/2012 01:35   Final    Culture NO GROWTH 5 DAYS   Final    Report Status 06/19/2012 FINAL   Final      Labs: Basic Metabolic Panel:  Lab 06/20/12 5366 06/19/12 4403 06/18/12 0952 06/17/12 1108 06/16/12 0655 06/15/12 2107  NA 128* 132* 135 133* 139 --  K 4.2 4.2 3.9 3.5 3.3* --  CL 89* 91* 93* 94* 97 --  CO2 21 25 27 25 22  --  GLUCOSE 126* 188* 216* 204* 173* --  BUN 68* 55* 35* 43* 50* --  CREATININE 4.82* 4.34* 3.17* 3.39* 3.49* --  CALCIUM 8.9 9.0 9.5 9.1 9.3 --  MG -- -- -- -- -- --  PHOS 5.8* 4.9* 3.5 3.8 -- 6.9*   Liver Function Tests:  Lab 06/20/12 0655 06/19/12 0833 06/18/12 0952 06/17/12 1108 06/15/12 2107  AST -- -- -- -- --  ALT -- -- -- -- --  ALKPHOS -- -- -- -- --  BILITOT -- -- -- -- --  PROT -- -- -- -- --  ALBUMIN 2.4* 2.5* 2.8* 2.7* 2.8*   CBC:  Lab 06/20/12 0655 06/17/12 1058 06/16/12 0655 06/15/12 2107 06/15/12 0958  WBC 11.0* 9.6 8.1 9.7 9.3  NEUTROABS -- 6.9 -- -- --  HGB 10.1* 9.8* 10.1* 9.4* 9.9*  HCT 29.9* 29.3* 30.5* 28.6* 29.2*  MCV 92.3 94.8 94.4 93.5 93.3  PLT 291 287 283 286 284   BNP: BNP (last 3 results)  Basename 06/16/12 0655 06/12/12 1901  PROBNP 29461.0* 30474.0*   CBG:  Lab 06/20/12 1145 06/20/12 0750 06/19/12 2115 06/19/12 1131 06/19/12 0743  GLUCAP 148* 118* 295* 137* 159*       Signed:  Hansen Carino K  [facility] 06/20/2012, 12:06 PM

## 2012-07-16 ENCOUNTER — Encounter: Payer: Self-pay | Admitting: Vascular Surgery

## 2012-07-17 ENCOUNTER — Ambulatory Visit (INDEPENDENT_AMBULATORY_CARE_PROVIDER_SITE_OTHER): Payer: Medicare Other | Admitting: Vascular Surgery

## 2012-07-17 VITALS — BP 174/65 | HR 69 | Resp 18 | Ht 65.0 in | Wt 127.0 lb

## 2012-07-17 DIAGNOSIS — N186 End stage renal disease: Secondary | ICD-10-CM

## 2012-07-17 DIAGNOSIS — Z01810 Encounter for preprocedural cardiovascular examination: Secondary | ICD-10-CM

## 2012-07-17 DIAGNOSIS — Z4931 Encounter for adequacy testing for hemodialysis: Secondary | ICD-10-CM

## 2012-07-17 NOTE — Progress Notes (Signed)
Right arteriovenous fistula duplex performed @ [facility] 07/17/2012

## 2012-07-17 NOTE — Progress Notes (Signed)
Patient is a 79-year-old female who presents today for evaluation of her right radiocephalic AV fistula. The patient had a fistula placed in August of 2012. They had tried to cannulate this recently but were having problems with infiltration. She is currently dialyzing via a right-sided catheter. She denies any numbness or tingling in her hand. She dialyzes Monday Wednesday Friday.  Review of systems: She denies shortness of breath. She denies chest pain.  Physical exam: Filed Vitals:   07/17/12 1323  BP: 174/65  Pulse: 69  Resp: 18  Height: 5' 5" (1.651 m)  Weight: 127 lb (57.607 kg)   Neck: Right-sided dialysis catheter no evidence of infection or inflammation Skin: No rash or erythema Right upper extremity: Palpable thrill in fistula the fistula is palpable throughout most of the forearm but is less palpable and she reached antecubital area, hand is pink and warm  Data: The patient had a duplex ultrasound of the fistula today. This showed a patent radiocephalic fistula. There was one competing sidebranch in the mid forearm. The fistula was 5-6 mm throughout its course. There were some increased velocities in the proximal third of the fistula suggesting possible stenosis.  Assessment: Poorly functioning right radiocephalic AV fistula.    Plan: Fistulogram possible intervention 07/24/2012 by partner Dr. Chen.  If the fistula is not a candidate for percutaneous intervention or operative revision we will need to consider a new access.  Sherel Fennell, MD [facility] [place] Office: [phone] Pager: [phone]  

## 2012-07-18 ENCOUNTER — Encounter (HOSPITAL_COMMUNITY): Payer: Self-pay | Admitting: Pharmacy Technician

## 2012-07-21 ENCOUNTER — Other Ambulatory Visit: Payer: Self-pay

## 2012-07-28 MED ORDER — SODIUM CHLORIDE 0.9 % IJ SOLN: 3.0000 mL | INTRAMUSCULAR | Status: DC | PRN

## 2012-07-29 ENCOUNTER — Ambulatory Visit (HOSPITAL_COMMUNITY)
Admission: RE | Admit: 2012-07-29 | Discharge: 2012-07-29 | Disposition: A | Discharge status: Home / Self Care | Payer: Medicare Other | Source: Ambulatory Visit | Attending: Vascular Surgery | Admitting: Vascular Surgery

## 2012-07-29 ENCOUNTER — Encounter (HOSPITAL_COMMUNITY)
Admission: RE | Disposition: A | Discharge status: Home / Self Care | Payer: Self-pay | Source: Ambulatory Visit | Attending: Vascular Surgery

## 2012-07-29 DIAGNOSIS — T82898A Other specified complication of vascular prosthetic devices, implants and grafts, initial encounter: Secondary | ICD-10-CM | POA: Insufficient documentation

## 2012-07-29 DIAGNOSIS — I871 Compression of vein: Secondary | ICD-10-CM | POA: Insufficient documentation

## 2012-07-29 DIAGNOSIS — Y832 Surgical operation with anastomosis, bypass or graft as the cause of abnormal reaction of the patient, or of later complication, without mention of misadventure at the time of the procedure: Secondary | ICD-10-CM | POA: Insufficient documentation

## 2012-07-29 DIAGNOSIS — Z992 Dependence on renal dialysis: Secondary | ICD-10-CM | POA: Insufficient documentation

## 2012-07-29 DIAGNOSIS — N186 End stage renal disease: Secondary | ICD-10-CM | POA: Insufficient documentation

## 2012-07-29 HISTORY — PX: SHUNTOGRAM: SHX5491

## 2012-07-29 LAB — POCT I-STAT, CHEM 8
BUN: 30 mg/dL — ABNORMAL HIGH (ref 6–23)
Calcium, Ion: 1.18 mmol/L (ref 1.13–1.30)
Chloride: 99 mEq/L (ref 96–112)
Glucose, Bld: 144 mg/dL — ABNORMAL HIGH (ref 70–99)
HCT: 40 % (ref 36.0–46.0)
Potassium: 3.6 mEq/L (ref 3.5–5.1)

## 2012-07-29 SURGERY — ASSESSMENT, SHUNT FUNCTION, WITH CONTRAST RADIOGRAPHIC STUDY
Anesthesia: LOCAL | Laterality: Right

## 2012-07-29 MED ORDER — LABETALOL HCL 5 MG/ML IV SOLN: 10.0000 mg | INTRAVENOUS | Status: DC | PRN

## 2012-07-29 MED ORDER — MORPHINE SULFATE 10 MG/ML IJ SOLN: 2.0000 mg | INTRAMUSCULAR | Status: DC | PRN

## 2012-07-29 MED ORDER — METOPROLOL TARTRATE 1 MG/ML IV SOLN: 2.0000 mg | INTRAVENOUS | Status: DC | PRN

## 2012-07-29 MED ORDER — ACETAMINOPHEN 325 MG PO TABS: 325.0000 mg | ORAL_TABLET | ORAL | Status: DC | PRN

## 2012-07-29 MED ORDER — HEPARIN (PORCINE) IN NACL 2-0.9 UNIT/ML-% IJ SOLN
INTRAMUSCULAR | Status: AC
  Filled 2012-07-29: qty 1000

## 2012-07-29 MED ORDER — LIDOCAINE HCL (PF) 1 % IJ SOLN
INTRAMUSCULAR | Status: AC
  Filled 2012-07-29: qty 30

## 2012-07-29 MED ORDER — ONDANSETRON HCL 4 MG/2ML IJ SOLN: 4.0000 mg | Freq: Four times a day (QID) | INTRAMUSCULAR | Status: DC | PRN

## 2012-07-29 MED ORDER — HYDRALAZINE HCL 20 MG/ML IJ SOLN: 10.0000 mg | INTRAMUSCULAR | Status: DC | PRN

## 2012-07-29 MED ORDER — ACETAMINOPHEN 325 MG RE SUPP: 325.0000 mg | RECTAL | Status: DC | PRN

## 2012-07-29 MED ORDER — LABETALOL HCL 5 MG/ML IV SOLN
INTRAVENOUS | Status: AC
  Filled 2012-07-29: qty 4

## 2012-07-29 MED ORDER — HYDRALAZINE HCL 20 MG/ML IJ SOLN
INTRAMUSCULAR | Status: AC
  Filled 2012-07-29: qty 1

## 2012-07-29 NOTE — Op Note (Signed)
Procedure: Right radial cephalic fistulogram  Preoperative diagnosis: Poor flow AV fistula  Postoperative diagnosis: Same  Anesthesia: Local  Operative details: After obtaining informed consent, the patient was taken to the PV lab. The patient was placed in supine position on the Angio table. Entire right upper extremity was prepped and draped in usual sterile fashion.   Local anesthesia was infiltrated over the proximal portion of the fistula in the right arm.   A micropuncture needle was brought up on the operative field and this was used to directly cannulate the fistula. A micropuncture wire was then threaded into the fistula and the micropuncture sheath threaded over this. Sheath was thoroughly flushed with heparinized saline and the dilator was removed. Contrast angiogram was then performed of the fistula.   The central venous structures are patent. The fistula is patent in its midportion.  There is a large side branch midfistula.  There is normal antecubital anatomy with patent basilic and cephalic vein.  There is irregularity of the proximal fistula with no narrowing of the arterial anastomosis.  Next, pressure was held on the upper arm in order to reflux contrast across the arterial anastomosis. The arterial anastomosis was viewed in multiple planes with no narrowing of the anastomosis.  The micropuncture sheath was removed and hemostasis obtained with direct pressure.  The patient tolerated the procedure well and there were no complications. The patient was taken to the holding area in stable condition.  Operative findings: Central veins: patent                                 Fistula: irregularity proximal fistula with mild narrowing, large side branch midfistula                   Pt will be scheduled for side branch ligation +/- proximal patch in the near future  Fabienne Bruns, MD [facility] [place] Office: [phone] Pager: [phone]

## 2012-07-29 NOTE — Interval H&P Note (Signed)
History and Physical Interval Note:  07/29/2012 11:22 AM  Kimberly Norman  has presented today for surgery, with the diagnosis of instage renal  The various methods of treatment have been discussed with the patient and family. After consideration of risks, benefits and other options for treatment, the patient has consented to  Procedure(s): SHUNTOGRAM (Right) as a surgical intervention .  The patient's history has been reviewed, patient examined, no change in status, stable for surgery.  I have reviewed the patient's chart and labs.  Questions were answered to the patient's satisfaction.     FIELDS,CHARLES E

## 2012-07-29 NOTE — H&P (View-Only) (Signed)
Patient is a 79 year old female who presents today for evaluation of her right radiocephalic AV fistula. The patient had a fistula placed in August of 2012. They had tried to cannulate this recently but were having problems with infiltration. She is currently dialyzing via a right-sided catheter. She denies any numbness or tingling in her hand. She dialyzes Monday Wednesday Friday.  Review of systems: She denies shortness of breath. She denies chest pain.  Physical exam: Filed Vitals:   07/17/12 1323  BP: 174/65  Pulse: 69  Resp: 18  Height: 5\' 5"  (1.651 m)  Weight: 127 lb (57.607 kg)   Neck: Right-sided dialysis catheter no evidence of infection or inflammation Skin: No rash or erythema Right upper extremity: Palpable thrill in fistula the fistula is palpable throughout most of the forearm but is less palpable and she reached antecubital area, hand is pink and warm  Data: The patient had a duplex ultrasound of the fistula today. This showed a patent radiocephalic fistula. There was one competing sidebranch in the mid forearm. The fistula was 5-6 mm throughout its course. There were some increased velocities in the proximal third of the fistula suggesting possible stenosis.  Assessment: Poorly functioning right radiocephalic AV fistula.    Plan: Fistulogram possible intervention 07/24/2012 by partner Dr. Imogene Burn.  If the fistula is not a candidate for percutaneous intervention or operative revision we will need to consider a new access.  Fabienne Bruns, MD [facility] [place] Office: [phone] Pager: [phone]

## 2012-07-29 NOTE — Progress Notes (Signed)
Client arrived from cath lab and right hand with cyanosis and blanched white fingers; no c/o numbness or pain right hand

## 2012-07-29 NOTE — Progress Notes (Signed)
DR Imogene Burn NOTIFIED OF RIGHT HAND WITH CYANOSIS AND BLANCHED WHITE FINGERS AND NO NEW ORDERS NOTED; OK TO D/C HOME.

## 2012-08-05 ENCOUNTER — Other Ambulatory Visit: Payer: Self-pay

## 2012-08-06 ENCOUNTER — Encounter (HOSPITAL_COMMUNITY): Payer: Self-pay | Admitting: Pharmacy Technician

## 2012-08-18 ENCOUNTER — Encounter (HOSPITAL_COMMUNITY): Payer: Self-pay | Admitting: *Deleted

## 2012-08-18 NOTE — Progress Notes (Signed)
Patient called to arrive at 8 spoke with nephew

## 2012-08-19 ENCOUNTER — Encounter (HOSPITAL_COMMUNITY): Payer: Self-pay | Admitting: *Deleted

## 2012-08-19 ENCOUNTER — Ambulatory Visit (HOSPITAL_COMMUNITY): Payer: Medicare Other | Admitting: Certified Registered Nurse Anesthetist

## 2012-08-19 ENCOUNTER — Encounter (HOSPITAL_COMMUNITY): Payer: Self-pay | Admitting: Certified Registered Nurse Anesthetist

## 2012-08-19 ENCOUNTER — Encounter (HOSPITAL_COMMUNITY)
Admission: RE | Disposition: A | Discharge status: Home / Self Care | Payer: Self-pay | Source: Ambulatory Visit | Attending: Vascular Surgery

## 2012-08-19 ENCOUNTER — Ambulatory Visit (HOSPITAL_COMMUNITY)
Admission: RE | Admit: 2012-08-19 | Discharge: 2012-08-19 | Disposition: A | Discharge status: Home / Self Care | Payer: Medicare Other | Source: Ambulatory Visit | Attending: Vascular Surgery | Admitting: Vascular Surgery

## 2012-08-19 DIAGNOSIS — Z992 Dependence on renal dialysis: Secondary | ICD-10-CM | POA: Insufficient documentation

## 2012-08-19 DIAGNOSIS — N186 End stage renal disease: Secondary | ICD-10-CM

## 2012-08-19 DIAGNOSIS — Y832 Surgical operation with anastomosis, bypass or graft as the cause of abnormal reaction of the patient, or of later complication, without mention of misadventure at the time of the procedure: Secondary | ICD-10-CM | POA: Insufficient documentation

## 2012-08-19 DIAGNOSIS — N189 Chronic kidney disease, unspecified: Secondary | ICD-10-CM | POA: Insufficient documentation

## 2012-08-19 DIAGNOSIS — F411 Generalized anxiety disorder: Secondary | ICD-10-CM | POA: Insufficient documentation

## 2012-08-19 DIAGNOSIS — I129 Hypertensive chronic kidney disease with stage 1 through stage 4 chronic kidney disease, or unspecified chronic kidney disease: Secondary | ICD-10-CM | POA: Insufficient documentation

## 2012-08-19 DIAGNOSIS — T82598A Other mechanical complication of other cardiac and vascular devices and implants, initial encounter: Secondary | ICD-10-CM | POA: Insufficient documentation

## 2012-08-19 DIAGNOSIS — Z87891 Personal history of nicotine dependence: Secondary | ICD-10-CM | POA: Insufficient documentation

## 2012-08-19 DIAGNOSIS — E119 Type 2 diabetes mellitus without complications: Secondary | ICD-10-CM | POA: Insufficient documentation

## 2012-08-19 DIAGNOSIS — I509 Heart failure, unspecified: Secondary | ICD-10-CM | POA: Insufficient documentation

## 2012-08-19 DIAGNOSIS — T82898A Other specified complication of vascular prosthetic devices, implants and grafts, initial encounter: Secondary | ICD-10-CM

## 2012-08-19 HISTORY — DX: Anxiety disorder, unspecified: F41.9

## 2012-08-19 HISTORY — DX: Heart failure, unspecified: I50.9

## 2012-08-19 HISTORY — PX: REVISON OF ARTERIOVENOUS FISTULA: SHX6074

## 2012-08-19 HISTORY — DX: Dependence on supplemental oxygen: Z99.81

## 2012-08-19 LAB — POCT I-STAT 4, (NA,K, GLUC, HGB,HCT)
Glucose, Bld: 142 mg/dL — ABNORMAL HIGH (ref 70–99)
HCT: 34 % — ABNORMAL LOW (ref 36.0–46.0)
Hemoglobin: 11.6 g/dL — ABNORMAL LOW (ref 12.0–15.0)
Potassium: 3.1 mEq/L — ABNORMAL LOW (ref 3.5–5.1)

## 2012-08-19 LAB — GLUCOSE, CAPILLARY
Glucose-Capillary: 139 mg/dL — ABNORMAL HIGH (ref 70–99)
Glucose-Capillary: 126 mg/dL — ABNORMAL HIGH (ref 70–99)

## 2012-08-19 SURGERY — REVISON OF ARTERIOVENOUS FISTULA
Anesthesia: Monitor Anesthesia Care | Site: Arm Lower | Laterality: Right

## 2012-08-19 MED ORDER — DEXTROSE 5 % IV SOLN
1.5000 g | INTRAVENOUS | Status: DC
  Filled 2012-08-19: qty 1.5

## 2012-08-19 MED ORDER — LIDOCAINE-EPINEPHRINE (PF) 1 %-1:200000 IJ SOLN
INTRAMUSCULAR | Status: AC
  Filled 2012-08-19: qty 10

## 2012-08-19 MED ORDER — OXYCODONE HCL 5 MG PO TABS: 5.0000 mg | ORAL_TABLET | Freq: Three times a day (TID) | ORAL | Status: DC | PRN

## 2012-08-19 MED ORDER — SODIUM CHLORIDE 0.9 % IR SOLN
Status: DC | PRN
  Administered 2012-08-19: 13:00:00

## 2012-08-19 MED ORDER — 0.9 % SODIUM CHLORIDE (POUR BTL) OPTIME
TOPICAL | Status: DC | PRN
  Administered 2012-08-19: 1000 mL

## 2012-08-19 MED ORDER — SODIUM CHLORIDE 0.9 % IV SOLN
INTRAVENOUS | Status: DC
  Administered 2012-08-19: 12:00:00 via INTRAVENOUS

## 2012-08-19 MED ORDER — MUPIROCIN 2 % EX OINT: TOPICAL_OINTMENT | Freq: Two times a day (BID) | CUTANEOUS | Status: DC

## 2012-08-19 MED ORDER — MUPIROCIN 2 % EX OINT
TOPICAL_OINTMENT | CUTANEOUS | Status: AC
  Administered 2012-08-19: 11:00:00 via NASAL
  Filled 2012-08-19: qty 22

## 2012-08-19 MED ORDER — MIDAZOLAM HCL 5 MG/5ML IJ SOLN
INTRAMUSCULAR | Status: DC | PRN
  Administered 2012-08-19: 2 mg via INTRAVENOUS

## 2012-08-19 MED ORDER — LIDOCAINE-EPINEPHRINE (PF) 1 %-1:200000 IJ SOLN
INTRAMUSCULAR | Status: DC | PRN
  Administered 2012-08-19: 4 mL

## 2012-08-19 MED ORDER — SODIUM CHLORIDE 0.9 % IV SOLN
INTRAVENOUS | Status: DC | PRN
  Administered 2012-08-19: 13:00:00 via INTRAVENOUS

## 2012-08-19 MED ORDER — CEFAZOLIN SODIUM-DEXTROSE 2-3 GM-% IV SOLR
2.0000 g | INTRAVENOUS | Status: DC
  Filled 2012-08-19: qty 50

## 2012-08-19 MED ORDER — FENTANYL CITRATE 0.05 MG/ML IJ SOLN
INTRAMUSCULAR | Status: DC | PRN
  Administered 2012-08-19: 25 ug via INTRAVENOUS

## 2012-08-19 MED ORDER — SODIUM CHLORIDE 0.9 % IV SOLN: INTRAVENOUS | Status: DC

## 2012-08-19 MED ORDER — PROPOFOL 10 MG/ML IV BOLUS
INTRAVENOUS | Status: DC | PRN
  Administered 2012-08-19: 10 mg via INTRAVENOUS

## 2012-08-19 SURGICAL SUPPLY — 41 items
CANISTER SUCTION 2500CC (MISCELLANEOUS) ×2 IMPLANT
CLIP TI MEDIUM 6 (CLIP) ×2 IMPLANT
CLIP TI WIDE RED SMALL 6 (CLIP) ×2 IMPLANT
CLOTH BEACON ORANGE TIMEOUT ST (SAFETY) ×2 IMPLANT
COVER PROBE W GEL 5X96 (DRAPES) ×2 IMPLANT
COVER SURGICAL LIGHT HANDLE (MISCELLANEOUS) ×2 IMPLANT
DECANTER SPIKE VIAL GLASS SM (MISCELLANEOUS) ×2 IMPLANT
DERMABOND ADVANCED (GAUZE/BANDAGES/DRESSINGS) ×1
DERMABOND ADVANCED .7 DNX12 (GAUZE/BANDAGES/DRESSINGS) ×1 IMPLANT
DRAIN PENROSE 1/4X12 LTX STRL (WOUND CARE) ×2 IMPLANT
ELECT REM PT RETURN 9FT ADLT (ELECTROSURGICAL) ×2
ELECTRODE REM PT RTRN 9FT ADLT (ELECTROSURGICAL) ×1 IMPLANT
GAUZE SPONGE 2X2 8PLY STRL LF (GAUZE/BANDAGES/DRESSINGS) ×1 IMPLANT
GEL ULTRASOUND 20GR AQUASONIC (MISCELLANEOUS) IMPLANT
GLOVE BIO SURGEON STRL SZ 6.5 (GLOVE) ×2 IMPLANT
GLOVE BIO SURGEON STRL SZ7.5 (GLOVE) ×2 IMPLANT
GLOVE BIOGEL PI IND STRL 7.0 (GLOVE) ×1 IMPLANT
GLOVE BIOGEL PI IND STRL 7.5 (GLOVE) ×1 IMPLANT
GLOVE BIOGEL PI INDICATOR 7.0 (GLOVE) ×1
GLOVE BIOGEL PI INDICATOR 7.5 (GLOVE) ×1
GLOVE SURG SS PI 7.5 STRL IVOR (GLOVE) ×2 IMPLANT
GOWN PREVENTION PLUS XLARGE (GOWN DISPOSABLE) ×4 IMPLANT
GOWN STRL NON-REIN LRG LVL3 (GOWN DISPOSABLE) ×4 IMPLANT
KIT BASIN OR (CUSTOM PROCEDURE TRAY) ×2 IMPLANT
KIT ROOM TURNOVER OR (KITS) ×2 IMPLANT
LOOP VESSEL MINI RED (MISCELLANEOUS) IMPLANT
NS IRRIG 1000ML POUR BTL (IV SOLUTION) ×2 IMPLANT
PACK CV ACCESS (CUSTOM PROCEDURE TRAY) ×2 IMPLANT
PAD ARMBOARD 7.5X6 YLW CONV (MISCELLANEOUS) ×4 IMPLANT
SPONGE GAUZE 2X2 STER 10/PKG (GAUZE/BANDAGES/DRESSINGS) ×1
SPONGE SURGIFOAM ABS GEL 100 (HEMOSTASIS) IMPLANT
SUT ETHILON 4 0 PS 2 18 (SUTURE) ×2 IMPLANT
SUT PROLENE 7 0 BV 1 (SUTURE) ×2 IMPLANT
SUT VIC AB 3-0 SH 27 (SUTURE) ×1
SUT VIC AB 3-0 SH 27X BRD (SUTURE) ×1 IMPLANT
SUT VICRYL 4-0 PS2 18IN ABS (SUTURE) ×2 IMPLANT
TAPE CLOTH SURG 4X10 WHT LF (GAUZE/BANDAGES/DRESSINGS) ×2 IMPLANT
TOWEL OR 17X24 6PK STRL BLUE (TOWEL DISPOSABLE) ×2 IMPLANT
TOWEL OR 17X26 10 PK STRL BLUE (TOWEL DISPOSABLE) ×2 IMPLANT
UNDERPAD 30X30 INCONTINENT (UNDERPADS AND DIAPERS) ×2 IMPLANT
WATER STERILE IRR 1000ML POUR (IV SOLUTION) ×2 IMPLANT

## 2012-08-19 NOTE — Anesthesia Postprocedure Evaluation (Signed)
Anesthesia Post Note  Patient: Kimberly Norman  Procedure(s) Performed: Procedure(s) (LRB): REVISON OF ARTERIOVENOUS FISTULA (Right)  Anesthesia type: general  Patient location: PACU  Post pain: Pain level controlled  Post assessment: Patient's Cardiovascular Status Stable  Last Vitals:  Filed Vitals:   08/19/12 1415  BP: 167/58  Pulse: 53  Temp:   Resp: 18    Post vital signs: Reviewed and stable  Level of consciousness: sedated  Complications: No apparent anesthesia complications

## 2012-08-19 NOTE — Anesthesia Preprocedure Evaluation (Addendum)
Anesthesia Evaluation  Patient identified by MRN, date of birth, ID band Patient awake    Reviewed: Allergy & Precautions, H&P , NPO status , Patient's Chart, lab work & pertinent test results, reviewed documented beta blocker date and time   History of Anesthesia Complications Negative for: history of anesthetic complications  Airway Mallampati: II TM Distance: >3 FB Neck ROM: Full    Dental  (+) Teeth Intact and Dental Advisory Given   Pulmonary shortness of breath and Long-Term Oxygen Therapy, former smoker,    Pulmonary exam normal       Cardiovascular hypertension, Pt. on home beta blockers and Pt. on medications + Peripheral Vascular Disease and +CHF II+ Valvular Problems/Murmurs MR Rhythm:Regular Rate:Normal  EKG 1/14 = SR + PACs @ 81 1/14 Echo = 25%; Diffuse hypokinesis.  Mild MR, Mild LAE.  **Echo performed with CHF exacerbation.     Neuro/Psych Anxiety Thoracic Spondylosis per X-ray  1-14 negative neurological ROS     GI/Hepatic negative GI ROS, Neg liver ROS,   Endo/Other  diabetes, Well Controlled, Type 2, Oral Hypoglycemic Agents  Renal/GU DialysisRenal disease     Musculoskeletal   Abdominal Normal abdominal exam  (+)   Peds  Hematology negative hematology ROS (+)   Anesthesia Other Findings   Reproductive/Obstetrics                          Anesthesia Physical Anesthesia Plan  ASA: III  Anesthesia Plan: MAC   Post-op Pain Management:    Induction: Intravenous  Airway Management Planned: Simple Face Mask  Additional Equipment:   Intra-op Plan:   Post-operative Plan:   Informed Consent: I have reviewed the patients History and Physical, chart, labs and discussed the procedure including the risks, benefits and alternatives for the proposed anesthesia with the patient or authorized representative who has indicated his/her understanding and acceptance.   Dental advisory  given  Plan Discussed with: CRNA and Surgeon  Anesthesia Plan Comments:        Anesthesia Quick Evaluation

## 2012-08-19 NOTE — Preoperative (Signed)
Beta Blockers   Reason not to administer Beta Blockers:Not Applicable 

## 2012-08-19 NOTE — H&P (Signed)
  Patient is a 79 year old female who presents today for evaluation of her right radiocephalic AV fistula. The patient had a fistula placed in August of 2012. They had tried to cannulate this recently but were having problems with infiltration. She is currently dialyzing via a right-sided catheter. She denies any numbness or tingling in her hand. She dialyzes Monday Wednesday Friday.   Review of systems: She denies shortness of breath. She denies chest pain.  Physical exam: Filed Vitals:   08/19/12 0921 08/19/12 0926  BP: 137/72   Pulse: 75   Temp: 97.3 F (36.3 C)   TempSrc: Oral   Resp: 18   Height:  5' 5.5" (1.664 m)  Weight:  125 lb (56.7 kg)  SpO2: 100%     Neck: Right-sided dialysis catheter no evidence of infection or inflammation  Skin: No rash or erythema  Right upper extremity: Palpable thrill in fistula the fistula is palpable throughout most of the forearm but is less palpable at antecubital area, hand is pink and warm   Assessment: Poorly functioning right radiocephalic AV fistula.   Plan:Side branch ligation possible revision today  Fabienne Bruns, MD  [facility]  Office: [phone]  Pager: [phone]

## 2012-08-19 NOTE — Anesthesia Procedure Notes (Signed)
Procedure Name: MAC Date/Time: 08/19/2012 1:00 PM Performed by: Tyrone Nine Pre-anesthesia Checklist: Patient identified, Emergency Drugs available, Suction available, Patient being monitored and Timeout performed Patient Re-evaluated:Patient Re-evaluated prior to inductionOxygen Delivery Method: Simple face mask

## 2012-08-19 NOTE — OR Nursing (Signed)
Subclavian diaylsis access present on arrival to PACU. Both ports capped, dressing dry and intact.

## 2012-08-19 NOTE — Transfer of Care (Signed)
Immediate Anesthesia Transfer of Care Note  Patient: Kimberly Norman  Procedure(s) Performed: Procedure(s) with comments: REVISON OF ARTERIOVENOUS FISTULA (Right) - Side Branch Ligation of Right Arm AVF  Patient Location: PACU  Anesthesia Type:MAC  Level of Consciousness: awake, alert , oriented and patient cooperative  Airway & Oxygen Therapy: Patient Spontanous Breathing and Patient connected to nasal cannula oxygen  Post-op Assessment: Report given to PACU RN and Post -op Vital signs reviewed and stable  Post vital signs: Reviewed and stable  Complications: No apparent anesthesia complications

## 2012-08-19 NOTE — Op Note (Signed)
Procedure: Right Radial Cephalic AV Fistula side branch ligation  PreOp: Poorly functioning AVF  PostOp: same  Anesthesia: Local with MAC  Asst: Della Goo PA-C   Findings: 1 side branch ligated  Operative details: After obtaining informed consent, the patient was taken to the operating room. The patient was placed in supine position on the operating table. After adequate sedation, the patient's entire right upper extremity was prepped and draped in the usual sterile fashion. Ultrasound was used to identify 1 side branches mid fistula.  There were no other side branches. Local anesthesia was infiltrated over this area. A longitudinal incision was made over the side branch and the incision was carried onto the subcutaneous tissues down to level of the pre-existing AV fistula. The fistula was patent and did have a thrill within it. The mid forearm branch was 2 mm in diameter. This was ligated with a 2 0 silk ties and divided.  The skin was closed with interrupted nylon sutures.  The patient tolerated the procedure well and there were no complications. Instrument sponge and needle counts were correct at the end of the case. The patient was taken to the recovery room in stable condition.   Fabienne Bruns, MD  [facility]  Office: [phone]  Pager: [phone]

## 2012-08-21 ENCOUNTER — Telehealth: Payer: Self-pay | Admitting: Vascular Surgery

## 2012-08-21 NOTE — Telephone Encounter (Signed)
Message copied by Margaretmary Eddy on Thu Aug 21, 2012  9:50 AM ------      Message from: Marlowe Shores      Created: Tue Aug 19, 2012  1:31 PM       2 week f/u for staple removal ------

## 2012-08-21 NOTE — Telephone Encounter (Signed)
Spoke with pt, gave appt info - kf °

## 2012-08-23 ENCOUNTER — Encounter (HOSPITAL_COMMUNITY): Payer: Self-pay | Admitting: Vascular Surgery

## 2012-09-03 ENCOUNTER — Encounter: Payer: Self-pay | Admitting: Vascular Surgery

## 2012-09-04 ENCOUNTER — Ambulatory Visit (INDEPENDENT_AMBULATORY_CARE_PROVIDER_SITE_OTHER): Payer: Medicare Other | Admitting: Vascular Surgery

## 2012-09-04 ENCOUNTER — Encounter: Payer: Self-pay | Admitting: Vascular Surgery

## 2012-09-04 VITALS — BP 145/58 | HR 69 | Resp 16 | Ht 65.5 in | Wt 125.0 lb

## 2012-09-04 DIAGNOSIS — Z4802 Encounter for removal of sutures: Secondary | ICD-10-CM

## 2012-09-04 DIAGNOSIS — N186 End stage renal disease: Secondary | ICD-10-CM

## 2012-09-04 NOTE — Progress Notes (Signed)
Ms Rexene Edison. Kamilya Wakeman was seen today, three  sutures out Right  arm  AVF revision of 08/19/12.    Wound clean and dry, healing well.  Suture were removed with no difficulty.  Dry sterile dressing applied to right arm.  Dr. Darrick Penna told pt to wait 2 more weeks and then HD can use right AVF.   Rudy Domek Eldridge-Lewis, RMA  Patient is a 79 year old female who is status post sidebranch ligation of a right forearm AV fistula.  Physical exam:  The forearm fistula is easily palpable. There is an audible bruit and palpable thrill. The incision is healing well. Sutures were removed today.  Fabienne Bruns, MD [facility] Office: [phone] Pager: [phone]

## 2012-10-30 ENCOUNTER — Other Ambulatory Visit (HOSPITAL_COMMUNITY): Payer: Self-pay | Admitting: Nephrology

## 2012-10-30 DIAGNOSIS — N186 End stage renal disease: Secondary | ICD-10-CM

## 2012-11-04 ENCOUNTER — Ambulatory Visit (HOSPITAL_COMMUNITY)
Admission: RE | Admit: 2012-11-04 | Discharge: 2012-11-04 | Disposition: A | Discharge status: Home / Self Care | Payer: Medicare Other | Source: Ambulatory Visit | Attending: Nephrology | Admitting: Nephrology

## 2012-11-04 ENCOUNTER — Ambulatory Visit (HOSPITAL_COMMUNITY): Payer: Medicare Other

## 2012-11-04 DIAGNOSIS — Z452 Encounter for adjustment and management of vascular access device: Secondary | ICD-10-CM | POA: Insufficient documentation

## 2012-11-04 DIAGNOSIS — N186 End stage renal disease: Secondary | ICD-10-CM

## 2012-11-04 MED ORDER — LIDOCAINE HCL 1 % IJ SOLN
INTRAMUSCULAR | Status: AC
  Filled 2012-11-04: qty 20

## 2012-11-04 NOTE — Procedures (Signed)
Successful removal of right IJ Permcath No complications  Brayton El PA-C Interventional Radiology 11/04/2012 12:22 PM

## 2012-12-03 ENCOUNTER — Telehealth: Payer: Self-pay | Admitting: Vascular Surgery

## 2012-12-03 ENCOUNTER — Telehealth: Payer: Self-pay

## 2012-12-03 DIAGNOSIS — T82898A Other specified complication of vascular prosthetic devices, implants and grafts, initial encounter: Secondary | ICD-10-CM

## 2012-12-03 DIAGNOSIS — R230 Cyanosis: Secondary | ICD-10-CM

## 2012-12-03 NOTE — Telephone Encounter (Signed)
Pt. Called to report having had 2 episodes of 2 fingers "turning blue" at the end of her dialysis treatment, recently.  States on one occasion the index and middle finger became discolored, and the 2nd occasion the middle and ring finger became discolored.  States it only lasted approx. 1 1/2 minutes, when she was taken off the machine, and then the fingers "pinked-up".  Denies recent loss of sensation, or any pain.  Does state that at the age of 48, has noticed her fingertips are less sensitive.  Denies any symptoms at this time with fingers of right hand.  Discussed with Dr. Edilia Bo.  Advised  To schedule for duplex of access and office visit at next available.  Advised pt. Will call her with an appt.

## 2012-12-03 NOTE — Telephone Encounter (Signed)
lvm re appt info, sent letter, asked pt to call to confirm phone call - kf

## 2012-12-18 ENCOUNTER — Encounter (INDEPENDENT_AMBULATORY_CARE_PROVIDER_SITE_OTHER): Payer: Medicare Other | Admitting: *Deleted

## 2012-12-18 DIAGNOSIS — Z4931 Encounter for adequacy testing for hemodialysis: Secondary | ICD-10-CM

## 2012-12-18 DIAGNOSIS — R230 Cyanosis: Secondary | ICD-10-CM

## 2012-12-18 DIAGNOSIS — T82898A Other specified complication of vascular prosthetic devices, implants and grafts, initial encounter: Secondary | ICD-10-CM

## 2012-12-18 DIAGNOSIS — T82598A Other mechanical complication of other cardiac and vascular devices and implants, initial encounter: Secondary | ICD-10-CM

## 2012-12-24 ENCOUNTER — Encounter: Payer: Self-pay | Admitting: Vascular Surgery

## 2012-12-25 ENCOUNTER — Ambulatory Visit (INDEPENDENT_AMBULATORY_CARE_PROVIDER_SITE_OTHER): Payer: Medicare Other | Admitting: Vascular Surgery

## 2012-12-25 ENCOUNTER — Encounter: Payer: Self-pay | Admitting: Vascular Surgery

## 2012-12-25 VITALS — BP 138/66 | HR 72 | Ht 65.5 in | Wt 126.0 lb

## 2012-12-25 DIAGNOSIS — N186 End stage renal disease: Secondary | ICD-10-CM

## 2012-12-25 NOTE — Progress Notes (Signed)
79 year old female who previously had a right radiocephalic AV fistula placed. She does complain of coolness in her right hand. She has had intermittent numbness and tingling in several different digits after dialysis. She has had some improvement of her symptoms since switching to smaller needles. She had a duplex of her AV fistula to evaluate her symptoms on July 10. This suggested there may be some proximal narrowing. She states that there have not been pressure alarms on dialysis. She has had one infiltration but no other problems.  Review of systems: She denies shortness of breath. She denies chest pain.  Physical exam:  Filed Vitals:   12/25/12 1339  BP: 138/66  Pulse: 72  Height: 5' 5.5" (1.664 m)  Weight: 126 lb (57.153 kg)  SpO2: 100%   Right upper extremity: Palpable thrill right radiocephalic AV fistula capillary refill right hand compared to left is symmetric. Neuro: No atrophy of right hand intrinsic muscles Skin: No ulcerations  Assessment: Mild ischemic steal right hand. Possible proximal stenosis by recent duplex scan. However she has not had any problems with flow on dialysis that I know of. I am concerned that if we revise the fistula to try to improve flow in may make her steal symptoms worse. Observation for now would be the best course. She will followup on as-needed basis if her steal symptoms progress. These were discussed with the patient and her daughter today. If she has difficulty or changes in her flow rate we could revisit whether or not revision of the fistula would be required.  Fabienne Bruns, MD [facility] Office: [phone] Pager: [phone]

## 2013-02-16 ENCOUNTER — Other Ambulatory Visit: Payer: Self-pay | Admitting: *Deleted

## 2013-02-18 ENCOUNTER — Other Ambulatory Visit: Payer: Self-pay | Admitting: *Deleted

## 2013-04-23 ENCOUNTER — Other Ambulatory Visit: Payer: Self-pay | Admitting: Internal Medicine

## 2013-04-23 ENCOUNTER — Ambulatory Visit
Admission: RE | Admit: 2013-04-23 | Discharge: 2013-04-23 | Disposition: A | Discharge status: Home / Self Care | Payer: Medicare Other | Source: Ambulatory Visit | Attending: Internal Medicine | Admitting: Internal Medicine

## 2013-04-23 DIAGNOSIS — R600 Localized edema: Secondary | ICD-10-CM

## 2013-06-09 ENCOUNTER — Ambulatory Visit: Payer: Self-pay

## 2013-06-25 ENCOUNTER — Ambulatory Visit: Payer: Self-pay | Admitting: Podiatry

## 2013-07-16 ENCOUNTER — Ambulatory Visit (INDEPENDENT_AMBULATORY_CARE_PROVIDER_SITE_OTHER): Payer: Medicare Other | Admitting: Podiatry

## 2013-07-16 ENCOUNTER — Encounter: Payer: Self-pay | Admitting: Podiatry

## 2013-07-16 VITALS — BP 128/57 | HR 82 | Resp 16

## 2013-07-16 DIAGNOSIS — M79609 Pain in unspecified limb: Secondary | ICD-10-CM

## 2013-07-16 DIAGNOSIS — B351 Tinea unguium: Secondary | ICD-10-CM

## 2013-07-16 NOTE — Patient Instructions (Signed)
Diabetes and Foot Care Diabetes may cause you to have problems because of poor blood supply (circulation) to your feet and legs. This may cause the skin on your feet to become thinner, break easier, and heal more slowly. Your skin may become dry, and the skin may peel and crack. You may also have nerve damage in your legs and feet causing decreased feeling in them. You may not notice minor injuries to your feet that could lead to infections or more serious problems. Taking care of your feet is one of the most important things you can do for yourself.  HOME CARE INSTRUCTIONS  Wear shoes at all times, even in the house. Do not go barefoot. Bare feet are easily injured.  Check your feet daily for blisters, cuts, and redness. If you cannot see the bottom of your feet, use a mirror or ask someone for help.  Wash your feet with warm water (do not use hot water) and mild soap. Then pat your feet and the areas between your toes until they are completely dry. Do not soak your feet as this can dry your skin.  Apply a moisturizing lotion or petroleum jelly (that does not contain alcohol and is unscented) to the skin on your feet and to dry, brittle toenails. Do not apply lotion between your toes.  Trim your toenails straight across. Do not dig under them or around the cuticle. File the edges of your nails with an emery board or nail file.  Do not cut corns or calluses or try to remove them with medicine.  Wear clean socks or stockings every day. Make sure they are not too tight. Do not wear knee-high stockings since they may decrease blood flow to your legs.  Wear shoes that fit properly and have enough cushioning. To break in new shoes, wear them for just a few hours a day. This prevents you from injuring your feet. Always look in your shoes before you put them on to be sure there are no objects inside.  Do not cross your legs. This may decrease the blood flow to your feet.  If you find a minor scrape,  cut, or break in the skin on your feet, keep it and the skin around it clean and dry. These areas may be cleansed with mild soap and water. Do not cleanse the area with peroxide, alcohol, or iodine.  When you remove an adhesive bandage, be sure not to damage the skin around it.  If you have a wound, look at it several times a day to make sure it is healing.  Do not use heating pads or hot water bottles. They may burn your skin. If you have lost feeling in your feet or legs, you may not know it is happening until it is too late.  Make sure your health care provider performs a complete foot exam at least annually or more often if you have foot problems. Report any cuts, sores, or bruises to your health care provider immediately. SEEK MEDICAL CARE IF:   You have an injury that is not healing.  You have cuts or breaks in the skin.  You have an ingrown nail.  You notice redness on your legs or feet.  You feel burning or tingling in your legs or feet.  You have pain or cramps in your legs and feet.  Your legs or feet are numb.  Your feet always feel cold. SEEK IMMEDIATE MEDICAL CARE IF:   There is increasing redness,   swelling, or pain in or around a wound.  There is a red line that goes up your leg.  Pus is coming from a wound.  You develop a fever or as directed by your health care provider.  You notice a bad smell coming from an ulcer or wound. Document Released: 05/25/2000 Document Revised: 01/28/2013 Document Reviewed: 11/04/2012 [employer] Patient Information 2014 [employer].  

## 2013-07-16 NOTE — Progress Notes (Signed)
Subjective:     Patient ID: Kimberly Norman, female   DOB: 1918-12-11, 80 y.o.   MRN: [id number]  HPI patient presents with painful nailbeds 1-5 both feet that is impossible for her to cut   Review of Systems     Objective:   Physical Exam No change neurovascular status with nail disease 1-5 both feet with thickness and pain when pressed    Assessment:     Mycotic nail infection with pain 1-5 both feet     Plan:     Debridement painful nailbeds 1-5 both feet with no iatrogenic bleeding noted

## 2013-10-06 ENCOUNTER — Ambulatory Visit (INDEPENDENT_AMBULATORY_CARE_PROVIDER_SITE_OTHER): Payer: Medicare Other

## 2013-10-06 VITALS — BP 118/82 | HR 86 | Resp 12

## 2013-10-06 DIAGNOSIS — E1140 Type 2 diabetes mellitus with diabetic neuropathy, unspecified: Secondary | ICD-10-CM

## 2013-10-06 DIAGNOSIS — E1149 Type 2 diabetes mellitus with other diabetic neurological complication: Secondary | ICD-10-CM

## 2013-10-06 DIAGNOSIS — M79609 Pain in unspecified limb: Secondary | ICD-10-CM

## 2013-10-06 DIAGNOSIS — Q828 Other specified congenital malformations of skin: Secondary | ICD-10-CM

## 2013-10-06 DIAGNOSIS — E1129 Type 2 diabetes mellitus with other diabetic kidney complication: Secondary | ICD-10-CM

## 2013-10-06 DIAGNOSIS — B351 Tinea unguium: Secondary | ICD-10-CM

## 2013-10-06 DIAGNOSIS — E1142 Type 2 diabetes mellitus with diabetic polyneuropathy: Secondary | ICD-10-CM

## 2013-10-06 NOTE — Patient Instructions (Signed)
Diabetes and Foot Care Diabetes may cause you to have problems because of poor blood supply (circulation) to your feet and legs. This may cause the skin on your feet to become thinner, break easier, and heal more slowly. Your skin may become dry, and the skin may peel and crack. You may also have nerve damage in your legs and feet causing decreased feeling in them. You may not notice minor injuries to your feet that could lead to infections or more serious problems. Taking care of your feet is one of the most important things you can do for yourself.  HOME CARE INSTRUCTIONS  Wear shoes at all times, even in the house. Do not go barefoot. Bare feet are easily injured.  Check your feet daily for blisters, cuts, and redness. If you cannot see the bottom of your feet, use a mirror or ask someone for help.  Wash your feet with warm water (do not use hot water) and mild soap. Then pat your feet and the areas between your toes until they are completely dry. Do not soak your feet as this can dry your skin.  Apply a moisturizing lotion or petroleum jelly (that does not contain alcohol and is unscented) to the skin on your feet and to dry, brittle toenails. Do not apply lotion between your toes.  Trim your toenails straight across. Do not dig under them or around the cuticle. File the edges of your nails with an emery board or nail file.  Do not cut corns or calluses or try to remove them with medicine.  Wear clean socks or stockings every day. Make sure they are not too tight. Do not wear knee-high stockings since they may decrease blood flow to your legs.  Wear shoes that fit properly and have enough cushioning. To break in new shoes, wear them for just a few hours a day. This prevents you from injuring your feet. Always look in your shoes before you put them on to be sure there are no objects inside.  Do not cross your legs. This may decrease the blood flow to your feet.  If you find a minor scrape,  cut, or break in the skin on your feet, keep it and the skin around it clean and dry. These areas may be cleansed with mild soap and water. Do not cleanse the area with peroxide, alcohol, or iodine.  When you remove an adhesive bandage, be sure not to damage the skin around it.  If you have a wound, look at it several times a day to make sure it is healing.  Do not use heating pads or hot water bottles. They may burn your skin. If you have lost feeling in your feet or legs, you may not know it is happening until it is too late.  Make sure your health care provider performs a complete foot exam at least annually or more often if you have foot problems. Report any cuts, sores, or bruises to your health care provider immediately. SEEK MEDICAL CARE IF:   You have an injury that is not healing.  You have cuts or breaks in the skin.  You have an ingrown nail.  You notice redness on your legs or feet.  You feel burning or tingling in your legs or feet.  You have pain or cramps in your legs and feet.  Your legs or feet are numb.  Your feet always feel cold. SEEK IMMEDIATE MEDICAL CARE IF:   There is increasing redness,   swelling, or pain in or around a wound.  There is a red line that goes up your leg.  Pus is coming from a wound.  You develop a fever or as directed by your health care provider.  You notice a bad smell coming from an ulcer or wound. Document Released: 05/25/2000 Document Revised: 01/28/2013 Document Reviewed: 11/04/2012 [employer] Patient Information 2014 [employer].  

## 2013-10-06 NOTE — Progress Notes (Signed)
   Subjective:    Patient ID: Kimberly Norman, female    DOB: 1919/01/26, 80 y.o.   MRN: [id number]  HPI TOENAILS TRIM.   Review of Systems no systemic changes or findings are noted     Objective:   Physical Exam Lotion the objective findings as follows patient has report vascular status pedal pulses DP plus one over 4 thready pedis PT nonpalpable bilateral skin temperature warm to cool turgor diminished there is no edema mild varicosities noted neurologically epicritic and proprioceptive sensations diminished on Semmes Weinstein testing to forefoot digits and plantar arch. There is dry scaling fissuring skin and the areas fissuring keratoses HD 5 left also fissuring and keratoses posterior medial right heel noted as well. Patient also is thick friable discolored brittle crumbly nails painful tender both on palpation range and on ambulation. Patient has followup lotion to be best used for skin may recommendations for use ring or Lubriderm or similar oil-based lotion avoid fragrances alcohol no other wounds ulcerations no secondary infections       Assessment & Plan:  Assessment this time his diabetes with peripheral neuropathy dry scaling skin recommend lotion daily this time multiple dystrophic from criptotic nails 1 through 5 bilateral are debrided and presence of diabetes patient is also debridement multiple keratoses HD 5 left and keratoses a posterior right heel return in 3 months for continued palliative care in the future as needed on debridement lumicain Neosporin applied to both hallux nails following debridement at this time.  Alvan Dameichard Jorja Empie DPM

## 2014-01-08 ENCOUNTER — Inpatient Hospital Stay (HOSPITAL_COMMUNITY)
Admission: EM | Admit: 2014-01-08 | Discharge: 2014-02-09 | DRG: 329 | Disposition: E | Payer: Medicare Other | Attending: Critical Care Medicine | Admitting: Critical Care Medicine

## 2014-01-08 ENCOUNTER — Emergency Department (HOSPITAL_COMMUNITY): Payer: Medicare Other

## 2014-01-08 ENCOUNTER — Encounter (HOSPITAL_COMMUNITY): Payer: Self-pay | Admitting: Emergency Medicine

## 2014-01-08 DIAGNOSIS — J9601 Acute respiratory failure with hypoxia: Secondary | ICD-10-CM

## 2014-01-08 DIAGNOSIS — E872 Acidosis, unspecified: Secondary | ICD-10-CM | POA: Diagnosis not present

## 2014-01-08 DIAGNOSIS — I2589 Other forms of chronic ischemic heart disease: Secondary | ICD-10-CM | POA: Diagnosis present

## 2014-01-08 DIAGNOSIS — M129 Arthropathy, unspecified: Secondary | ICD-10-CM | POA: Diagnosis present

## 2014-01-08 DIAGNOSIS — N186 End stage renal disease: Secondary | ICD-10-CM | POA: Diagnosis present

## 2014-01-08 DIAGNOSIS — E785 Hyperlipidemia, unspecified: Secondary | ICD-10-CM | POA: Diagnosis present

## 2014-01-08 DIAGNOSIS — K658 Other peritonitis: Secondary | ICD-10-CM | POA: Diagnosis not present

## 2014-01-08 DIAGNOSIS — Z515 Encounter for palliative care: Secondary | ICD-10-CM

## 2014-01-08 DIAGNOSIS — Z79899 Other long term (current) drug therapy: Secondary | ICD-10-CM | POA: Diagnosis not present

## 2014-01-08 DIAGNOSIS — R570 Cardiogenic shock: Secondary | ICD-10-CM | POA: Diagnosis not present

## 2014-01-08 DIAGNOSIS — I701 Atherosclerosis of renal artery: Secondary | ICD-10-CM | POA: Diagnosis present

## 2014-01-08 DIAGNOSIS — I5033 Acute on chronic diastolic (congestive) heart failure: Secondary | ICD-10-CM | POA: Diagnosis not present

## 2014-01-08 DIAGNOSIS — Z9849 Cataract extraction status, unspecified eye: Secondary | ICD-10-CM

## 2014-01-08 DIAGNOSIS — R6521 Severe sepsis with septic shock: Secondary | ICD-10-CM

## 2014-01-08 DIAGNOSIS — R630 Anorexia: Secondary | ICD-10-CM | POA: Diagnosis present

## 2014-01-08 DIAGNOSIS — Z9981 Dependence on supplemental oxygen: Secondary | ICD-10-CM | POA: Diagnosis not present

## 2014-01-08 DIAGNOSIS — D638 Anemia in other chronic diseases classified elsewhere: Secondary | ICD-10-CM | POA: Diagnosis present

## 2014-01-08 DIAGNOSIS — Z992 Dependence on renal dialysis: Secondary | ICD-10-CM

## 2014-01-08 DIAGNOSIS — M949 Disorder of cartilage, unspecified: Secondary | ICD-10-CM

## 2014-01-08 DIAGNOSIS — R1032 Left lower quadrant pain: Secondary | ICD-10-CM | POA: Diagnosis present

## 2014-01-08 DIAGNOSIS — A419 Sepsis, unspecified organism: Secondary | ICD-10-CM | POA: Diagnosis not present

## 2014-01-08 DIAGNOSIS — N2581 Secondary hyperparathyroidism of renal origin: Secondary | ICD-10-CM | POA: Diagnosis present

## 2014-01-08 DIAGNOSIS — M810 Age-related osteoporosis without current pathological fracture: Secondary | ICD-10-CM | POA: Diagnosis present

## 2014-01-08 DIAGNOSIS — J9600 Acute respiratory failure, unspecified whether with hypoxia or hypercapnia: Secondary | ICD-10-CM | POA: Diagnosis not present

## 2014-01-08 DIAGNOSIS — Z87891 Personal history of nicotine dependence: Secondary | ICD-10-CM

## 2014-01-08 DIAGNOSIS — E875 Hyperkalemia: Secondary | ICD-10-CM | POA: Diagnosis not present

## 2014-01-08 DIAGNOSIS — F411 Generalized anxiety disorder: Secondary | ICD-10-CM | POA: Diagnosis present

## 2014-01-08 DIAGNOSIS — Z794 Long term (current) use of insulin: Secondary | ICD-10-CM

## 2014-01-08 DIAGNOSIS — I214 Non-ST elevation (NSTEMI) myocardial infarction: Secondary | ICD-10-CM | POA: Diagnosis not present

## 2014-01-08 DIAGNOSIS — K5732 Diverticulitis of large intestine without perforation or abscess without bleeding: Principal | ICD-10-CM | POA: Diagnosis present

## 2014-01-08 DIAGNOSIS — E1129 Type 2 diabetes mellitus with other diabetic kidney complication: Secondary | ICD-10-CM | POA: Diagnosis present

## 2014-01-08 DIAGNOSIS — D72829 Elevated white blood cell count, unspecified: Secondary | ICD-10-CM

## 2014-01-08 DIAGNOSIS — I120 Hypertensive chronic kidney disease with stage 5 chronic kidney disease or end stage renal disease: Secondary | ICD-10-CM | POA: Diagnosis present

## 2014-01-08 DIAGNOSIS — K5792 Diverticulitis of intestine, part unspecified, without perforation or abscess without bleeding: Secondary | ICD-10-CM | POA: Diagnosis present

## 2014-01-08 DIAGNOSIS — Z8249 Family history of ischemic heart disease and other diseases of the circulatory system: Secondary | ICD-10-CM | POA: Diagnosis not present

## 2014-01-08 DIAGNOSIS — I509 Heart failure, unspecified: Secondary | ICD-10-CM | POA: Diagnosis present

## 2014-01-08 DIAGNOSIS — I2789 Other specified pulmonary heart diseases: Secondary | ICD-10-CM | POA: Diagnosis present

## 2014-01-08 DIAGNOSIS — I491 Atrial premature depolarization: Secondary | ICD-10-CM | POA: Diagnosis present

## 2014-01-08 DIAGNOSIS — I10 Essential (primary) hypertension: Secondary | ICD-10-CM

## 2014-01-08 DIAGNOSIS — M899 Disorder of bone, unspecified: Secondary | ICD-10-CM | POA: Diagnosis present

## 2014-01-08 DIAGNOSIS — E1121 Type 2 diabetes mellitus with diabetic nephropathy: Secondary | ICD-10-CM

## 2014-01-08 DIAGNOSIS — M109 Gout, unspecified: Secondary | ICD-10-CM | POA: Diagnosis present

## 2014-01-08 DIAGNOSIS — R6520 Severe sepsis without septic shock: Secondary | ICD-10-CM | POA: Diagnosis not present

## 2014-01-08 DIAGNOSIS — I498 Other specified cardiac arrhythmias: Secondary | ICD-10-CM

## 2014-01-08 DIAGNOSIS — I739 Peripheral vascular disease, unspecified: Secondary | ICD-10-CM | POA: Diagnosis present

## 2014-01-08 DIAGNOSIS — K5900 Constipation, unspecified: Secondary | ICD-10-CM | POA: Diagnosis present

## 2014-01-08 DIAGNOSIS — K5720 Diverticulitis of large intestine with perforation and abscess without bleeding: Secondary | ICD-10-CM

## 2014-01-08 HISTORY — DX: Atrial premature depolarization: I49.1

## 2014-01-08 LAB — LACTIC ACID, PLASMA: LACTIC ACID, VENOUS: 2.2 mmol/L (ref 0.5–2.2)

## 2014-01-08 LAB — HEPATITIS B SURFACE ANTIGEN: HEP B S AG: NEGATIVE

## 2014-01-08 LAB — CBC WITH DIFFERENTIAL/PLATELET
Basophils Absolute: 0 10*3/uL (ref 0.0–0.1)
Basophils Relative: 0 % (ref 0–1)
EOS ABS: 0 10*3/uL (ref 0.0–0.7)
EOS PCT: 0 % (ref 0–5)
HEMATOCRIT: 31.2 % — AB (ref 36.0–46.0)
HEMOGLOBIN: 10.2 g/dL — AB (ref 12.0–15.0)
LYMPHS ABS: 0.4 10*3/uL — AB (ref 0.7–4.0)
LYMPHS PCT: 3 % — AB (ref 12–46)
MCH: 31.9 pg (ref 26.0–34.0)
MCHC: 32.7 g/dL (ref 30.0–36.0)
MCV: 97.5 fL (ref 78.0–100.0)
MONO ABS: 0.7 10*3/uL (ref 0.1–1.0)
MONOS PCT: 5 % (ref 3–12)
Neutro Abs: 12.2 10*3/uL — ABNORMAL HIGH (ref 1.7–7.7)
Neutrophils Relative %: 92 % — ABNORMAL HIGH (ref 43–77)
PLATELETS: 235 10*3/uL (ref 150–400)
RBC: 3.2 MIL/uL — AB (ref 3.87–5.11)
RDW: 14.1 % (ref 11.5–15.5)
WBC: 13.3 10*3/uL — AB (ref 4.0–10.5)

## 2014-01-08 LAB — URINE MICROSCOPIC-ADD ON

## 2014-01-08 LAB — BASIC METABOLIC PANEL
Anion gap: 20 — ABNORMAL HIGH (ref 5–15)
BUN: 58 mg/dL — AB (ref 6–23)
CHLORIDE: 85 meq/L — AB (ref 96–112)
CO2: 25 meq/L (ref 19–32)
Calcium: 8.9 mg/dL (ref 8.4–10.5)
Creatinine, Ser: 5.11 mg/dL — ABNORMAL HIGH (ref 0.50–1.10)
GFR calc Af Amer: 8 mL/min — ABNORMAL LOW (ref >90)
GFR, EST NON AFRICAN AMERICAN: 7 mL/min — AB (ref >90)
GLUCOSE: 230 mg/dL — AB (ref 70–99)
POTASSIUM: 5.1 meq/L (ref 3.7–5.3)
SODIUM: 130 meq/L — AB (ref 137–147)

## 2014-01-08 LAB — HEPATIC FUNCTION PANEL
ALK PHOS: 77 U/L (ref 39–117)
ALT: 7 U/L (ref 0–35)
AST: 12 U/L (ref 0–37)
Albumin: 2.8 g/dL — ABNORMAL LOW (ref 3.5–5.2)
BILIRUBIN INDIRECT: 0.5 mg/dL (ref 0.3–0.9)
BILIRUBIN TOTAL: 0.8 mg/dL (ref 0.3–1.2)
Bilirubin, Direct: 0.3 mg/dL (ref 0.0–0.3)
TOTAL PROTEIN: 7.4 g/dL (ref 6.0–8.3)

## 2014-01-08 LAB — URINALYSIS, ROUTINE W REFLEX MICROSCOPIC
Glucose, UA: NEGATIVE mg/dL
KETONES UR: NEGATIVE mg/dL
Leukocytes, UA: NEGATIVE
Nitrite: NEGATIVE
Protein, ur: >300 mg/dL — AB
SPECIFIC GRAVITY, URINE: 1.019 (ref 1.005–1.030)
UROBILINOGEN UA: 1 mg/dL (ref 0.0–1.0)
pH: 6.5 (ref 5.0–8.0)

## 2014-01-08 LAB — GLUCOSE, CAPILLARY
Glucose-Capillary: 187 mg/dL — ABNORMAL HIGH (ref 70–99)
Glucose-Capillary: 81 mg/dL (ref 70–99)

## 2014-01-08 LAB — TSH: TSH: 1.02 u[IU]/mL (ref 0.350–4.500)

## 2014-01-08 LAB — HEMOGLOBIN A1C
HEMOGLOBIN A1C: 6.9 % — AB (ref <5.7)
MEAN PLASMA GLUCOSE: 151 mg/dL — AB (ref <117)

## 2014-01-08 MED ORDER — DOCUSATE SODIUM 100 MG PO CAPS
200.0000 mg | ORAL_CAPSULE | Freq: Two times a day (BID) | ORAL | Status: DC
  Administered 2014-01-08: 200 mg via ORAL
  Filled 2014-01-08 (×3): qty 2

## 2014-01-08 MED ORDER — SODIUM CHLORIDE 0.9 % IV SOLN
Freq: Once | INTRAVENOUS | Status: AC
  Administered 2014-01-08: 13:00:00 via INTRAVENOUS

## 2014-01-08 MED ORDER — CARVEDILOL 3.125 MG PO TABS
3.1250 mg | ORAL_TABLET | Freq: Two times a day (BID) | ORAL | Status: DC
  Filled 2014-01-08 (×4): qty 1

## 2014-01-08 MED ORDER — SODIUM CHLORIDE 0.9 % IV SOLN: INTRAVENOUS | Status: AC

## 2014-01-08 MED ORDER — SUCROFERRIC OXYHYDROXIDE 500 MG PO CHEW
500.0000 mg | CHEWABLE_TABLET | Freq: Three times a day (TID) | ORAL | Status: DC
  Filled 2014-01-08 (×2): qty 1

## 2014-01-08 MED ORDER — SODIUM CHLORIDE 0.9 % IV SOLN: 100.0000 mL | INTRAVENOUS | Status: DC | PRN

## 2014-01-08 MED ORDER — MORPHINE SULFATE 2 MG/ML IJ SOLN
2.0000 mg | Freq: Once | INTRAMUSCULAR | Status: AC
  Administered 2014-01-08: 2 mg via INTRAVENOUS
  Filled 2014-01-08: qty 1

## 2014-01-08 MED ORDER — HEPARIN SODIUM (PORCINE) 1000 UNIT/ML DIALYSIS
20.0000 [IU]/kg | Freq: Once | INTRAMUSCULAR | Status: DC
Start: 2014-01-09 — End: 2014-01-11
  Filled 2014-01-08: qty 2

## 2014-01-08 MED ORDER — MORPHINE SULFATE 2 MG/ML IJ SOLN
1.0000 mg | INTRAMUSCULAR | Status: DC | PRN
  Administered 2014-01-09: 1 mg via INTRAVENOUS
  Filled 2014-01-08 (×2): qty 1

## 2014-01-08 MED ORDER — GUAIFENESIN-DM 100-10 MG/5ML PO SYRP
5.0000 mL | ORAL_SOLUTION | ORAL | Status: DC | PRN
  Filled 2014-01-08: qty 5

## 2014-01-08 MED ORDER — PENTAFLUOROPROP-TETRAFLUOROETH EX AERO: 1.0000 "application " | INHALATION_SPRAY | CUTANEOUS | Status: DC | PRN

## 2014-01-08 MED ORDER — INSULIN ASPART 100 UNIT/ML ~~LOC~~ SOLN
0.0000 [IU] | Freq: Four times a day (QID) | SUBCUTANEOUS | Status: DC
  Administered 2014-01-08 – 2014-01-10 (×5): 2 [IU] via SUBCUTANEOUS
  Administered 2014-01-10: 1 [IU] via SUBCUTANEOUS
  Administered 2014-01-11: 2 [IU] via SUBCUTANEOUS
  Administered 2014-01-11: 1 [IU] via SUBCUTANEOUS

## 2014-01-08 MED ORDER — SEVELAMER CARBONATE 800 MG PO TABS
1600.0000 mg | ORAL_TABLET | Freq: Three times a day (TID) | ORAL | Status: DC
  Filled 2014-01-08 (×5): qty 2

## 2014-01-08 MED ORDER — HEPARIN SODIUM (PORCINE) 5000 UNIT/ML IJ SOLN
5000.0000 [IU] | Freq: Three times a day (TID) | INTRAMUSCULAR | Status: DC
  Administered 2014-01-08 – 2014-01-11 (×8): 5000 [IU] via SUBCUTANEOUS
  Filled 2014-01-08 (×14): qty 1

## 2014-01-08 MED ORDER — HEPARIN SODIUM (PORCINE) 1000 UNIT/ML DIALYSIS
1000.0000 [IU] | INTRAMUSCULAR | Status: DC | PRN
Start: 2014-01-08 — End: 2014-01-11
  Filled 2014-01-08: qty 1

## 2014-01-08 MED ORDER — IOHEXOL 300 MG/ML  SOLN
100.0000 mL | Freq: Once | INTRAMUSCULAR | Status: AC | PRN
  Administered 2014-01-08: 100 mL via INTRAVENOUS

## 2014-01-08 MED ORDER — IOHEXOL 300 MG/ML  SOLN
25.0000 mL | INTRAMUSCULAR | Status: AC
  Administered 2014-01-08: 25 mL via ORAL

## 2014-01-08 MED ORDER — METOPROLOL TARTRATE 1 MG/ML IV SOLN
5.0000 mg | INTRAVENOUS | Status: DC | PRN
  Administered 2014-01-08: 5 mg via INTRAVENOUS
  Filled 2014-01-08: qty 5

## 2014-01-08 MED ORDER — LIDOCAINE HCL (PF) 1 % IJ SOLN: 5.0000 mL | INTRAMUSCULAR | Status: DC | PRN

## 2014-01-08 MED ORDER — METOPROLOL TARTRATE 1 MG/ML IV SOLN
INTRAVENOUS | Status: AC
Start: 2014-01-08 — End: 2014-01-08
  Filled 2014-01-08: qty 5

## 2014-01-08 MED ORDER — ONDANSETRON HCL 4 MG/2ML IJ SOLN: 4.0000 mg | Freq: Four times a day (QID) | INTRAMUSCULAR | Status: DC | PRN

## 2014-01-08 MED ORDER — ATORVASTATIN CALCIUM 40 MG PO TABS
40.0000 mg | ORAL_TABLET | Freq: Every day | ORAL | Status: DC
  Filled 2014-01-08 (×2): qty 1

## 2014-01-08 MED ORDER — HYDROXYZINE HCL 25 MG PO TABS
25.0000 mg | ORAL_TABLET | Freq: Every day | ORAL | Status: DC | PRN
  Filled 2014-01-08: qty 1

## 2014-01-08 MED ORDER — PIPERACILLIN-TAZOBACTAM IN DEX 2-0.25 GM/50ML IV SOLN
2.2500 g | Freq: Three times a day (TID) | INTRAVENOUS | Status: DC
  Administered 2014-01-08 – 2014-01-11 (×9): 2.25 g via INTRAVENOUS
  Filled 2014-01-08 (×15): qty 50

## 2014-01-08 MED ORDER — LIDOCAINE-PRILOCAINE 2.5-2.5 % EX CREA: 1.0000 "application " | TOPICAL_CREAM | CUTANEOUS | Status: DC | PRN

## 2014-01-08 MED ORDER — POLYETHYLENE GLYCOL 3350 17 G PO PACK
17.0000 g | PACK | Freq: Two times a day (BID) | ORAL | Status: DC
  Administered 2014-01-08: 17 g via ORAL
  Filled 2014-01-08 (×4): qty 1

## 2014-01-08 MED ORDER — LORAZEPAM 0.5 MG PO TABS
0.5000 mg | ORAL_TABLET | Freq: Two times a day (BID) | ORAL | Status: DC | PRN
  Administered 2014-01-09: 0.5 mg via ORAL
  Filled 2014-01-08: qty 1

## 2014-01-08 MED ORDER — NEPRO/CARBSTEADY PO LIQD: 237.0000 mL | ORAL | Status: DC | PRN

## 2014-01-08 MED ORDER — ONDANSETRON HCL 4 MG PO TABS: 4.0000 mg | ORAL_TABLET | Freq: Four times a day (QID) | ORAL | Status: DC | PRN

## 2014-01-08 MED ORDER — HYDROCODONE-ACETAMINOPHEN 5-325 MG PO TABS
1.0000 | ORAL_TABLET | ORAL | Status: DC | PRN
  Administered 2014-01-09: 1 via ORAL
  Filled 2014-01-08: qty 1

## 2014-01-08 MED ORDER — SODIUM CHLORIDE 0.9 % IJ SOLN
3.0000 mL | Freq: Two times a day (BID) | INTRAMUSCULAR | Status: DC
  Administered 2014-01-08 – 2014-01-11 (×6): 3 mL via INTRAVENOUS

## 2014-01-08 MED ORDER — DARBEPOETIN ALFA-POLYSORBATE 40 MCG/0.4ML IJ SOLN
40.0000 ug | INTRAMUSCULAR | Status: DC
  Administered 2014-01-08: 40 ug via INTRAVENOUS
  Filled 2014-01-08: qty 0.4

## 2014-01-08 MED ORDER — ALTEPLASE 2 MG IJ SOLR
2.0000 mg | Freq: Once | INTRAMUSCULAR | Status: AC | PRN
  Filled 2014-01-08: qty 2

## 2014-01-08 MED ORDER — SODIUM CHLORIDE 0.9 % IV SOLN
62.5000 mg | INTRAVENOUS | Status: DC
  Filled 2014-01-08: qty 5

## 2014-01-08 NOTE — Progress Notes (Signed)
Pt  Ran a 3.5hr hemodialysis Tx. During the first hour of Tx Pt had a run of an accelerated Junctional rhythm. Pt was given 43m lopressor and rate decreased. Pt continued to to have inverted P waves, PAC's and occasional PVC's. Pt was taken out of U[facility] when her HR increased and therefore her goal of 2L was not met.

## 2014-01-08 NOTE — Consult Note (Signed)
CARDIOLOGY CONSULT NOTE   Patient ID: Kimberly Norman MRN: [id number] DOB/AGE: Nov 21, 1918 80 y.o.  Admit date: 01/07/2014  Primary Physician   [name], MD Primary Cardiologist  Dr. Patty Sermons  Reason for Consultation   Junction rhythm    HPI: Kimberly Norman is a 80 y.o. female with a history of chronic systolic CHF (EF 40%- 06/2012) ESRD on dialysis MWF, HTN, HLD, type 2 DM, gout and anemia of chronic disease who comes to the hospital with abdominal pain and workup consistent with acute diverticulitis with microperforation. While on HD today she went into a junctional rhythm and was mildly hypotensive and cardiology was consulted.   The patient is not followed by cardiology but has been seen by Dr. Patty Sermons on a past admission for acute, new onset CHF in 06/2013. She denies palpitations, chest pain or SOB. Her only complaint is abdominal pain for which she presented to the hospital. No orthopnea, PND, LE swelling, lightheadedness or dizziness or syncope. Patient denies any fever chills, no headache, cough, no proceeding diarrhea but there is constipation for the past 34 days.   Past Medical History  Diagnosis Date  . Hyperlipidemia   . Hypertension   . Diabetes mellitus Age 41    using insulin  . Arthritis   . Osteoporosis   . Cataract   . Constipation 10/26/10  . Gout   . Anemia     Receives iron infusions per nephrology  . CHF (congestive heart failure)   . On home oxygen therapy     2 l  . Anxiety     situational- surgery  . Chronic kidney disease     ESRD. Kidney dz since ~2008. s/p fistula placement.. Low office weight 143.   Marland Kitchen Renal artery stenosis     Angiogram 2006 shoed L RAS  60% and 30% stenosis in R renal artery.       Past Surgical History  Procedure Laterality Date  . Renal angiogram  2002  . Cataract extraction, bilateral    . Hand surgery      right  . Av fistula placement  01/10/2011    Right radiocephalic AVF  . Insertion of dialysis catheter        right shoulder  . Revison of arteriovenous fistula Right 08/19/2012    Procedure: REVISON OF ARTERIOVENOUS FISTULA;  Surgeon: Sherren Kerns, MD;  Location: [facility] OR;  Service: Vascular;  Laterality: Right;  Side Branch Ligation of Right Arm AVF    No Known Allergies  I have reviewed the patient's current medications . atorvastatin  40 mg Oral Daily  . carvedilol  3.125 mg Oral BID WC  . darbepoetin (ARANESP) injection - DIALYSIS  40 mcg Intravenous Q Fri-HD  . docusate sodium  200 mg Oral BID  . [START ON 01/16/2014] heparin  20 Units/kg Dialysis Once in dialysis  . heparin  5,000 Units Subcutaneous 3 times per day  . insulin aspart  0-9 Units Subcutaneous Q6H  . piperacillin-tazobactam (ZOSYN)  IV  2.25 g Intravenous 3 times per day  . polyethylene glycol  17 g Oral BID  . sevelamer carbonate  1,600 mg Oral TID WC  . sodium chloride  3 mL Intravenous Q12H     sodium chloride, sodium chloride, alteplase, feeding supplement (NEPRO CARB STEADY), guaiFENesin-dextromethorphan, heparin, HYDROcodone-acetaminophen, hydrOXYzine, lidocaine (PF), lidocaine-prilocaine, LORazepam, metoprolol, morphine injection, ondansetron (ZOFRAN) IV, ondansetron, pentafluoroprop-tetrafluoroeth  Prior to Admission medications   Medication Sig Start Date End Date Taking?  Authorizing Provider  albuterol (PROVENTIL HFA;VENTOLIN HFA) 108 (90 BASE) MCG/ACT inhaler Inhale 2 puffs into the lungs every 6 (six) hours as needed. For shortness of breath   Yes Historical Provider, MD  allopurinol (ZYLOPRIM) 100 MG tablet Take 50 mg by mouth every morning.   Yes Historical Provider, MD  atorvastatin (LIPITOR) 40 MG tablet Take 40 mg by mouth daily.    Yes Historical Provider, MD  carvedilol (COREG) 3.125 MG tablet Take 3.125 mg by mouth 2 (two) times daily with a meal.   Yes Historical Provider, MD  cyanocobalamin 2000 MCG tablet Take 2,000 mcg by mouth daily.   Yes Historical Provider, MD  docusate sodium (COLACE) 100 MG  capsule Take 100 mg by mouth 2 (two) times daily as needed. For constipation   Yes Historical Provider, MD  glimepiride (AMARYL) 1 MG tablet Take 0.5 mg by mouth daily with breakfast.   Yes Historical Provider, MD  HYDROcodone-acetaminophen (NORCO/VICODIN) 5-325 MG per tablet Take 1-2 tablets by mouth every 6 (six) hours as needed for moderate pain (coughing or anxiety).   Yes Historical Provider, MD  hydrOXYzine (ATARAX/VISTARIL) 25 MG tablet Take 25 mg by mouth daily as needed for anxiety or itching (give only as needed).   Yes Historical Provider, MD  linagliptin (TRADJENTA) 5 MG TABS tablet Take 1.25 mg by mouth daily.   Yes Historical Provider, MD  LORazepam (ATIVAN) 0.5 MG tablet Take 0.5 mg by mouth 2 (two) times daily as needed for anxiety.   Yes Historical Provider, MD  polyethylene glycol (MIRALAX / GLYCOLAX) packet Take 17 g by mouth daily as needed. For constipation   Yes Historical Provider, MD  sorbitol 70 % solution Take 60 mLs by mouth daily as needed (for constipation).    Yes Historical Provider, MD  VELPHORO 500 MG chewable tablet Chew 500 mg by mouth 3 (three) times daily with meals. 12/04/13  Yes Historical Provider, MD  zolpidem (AMBIEN) 5 MG tablet Take 5 mg by mouth at bedtime as needed (insomnia).    Historical Provider, MD     History   Social History  . Marital Status: Widowed    Spouse Name: N/A    Number of Children: N/A  . Years of Education: N/A   Occupational History  . Not on file.   Social History Main Topics  . Smoking status: Former Smoker -- 17 years    Types: Cigarettes    Quit date: 01/24/1981  . Smokeless tobacco: Never Used  . Alcohol Use: No  . Drug Use: No  . Sexual Activity: Not on file   Other Topics Concern  . Not on file   Social History Narrative   [place], lived in [place]. Graduated from HS and went to [employer] for 3 years for business. 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 Retired again in 2006.       Patient very active and lives by herself. She cooks for herself and does ADLs on her own. Very independent.           Family Status  Relation Status Death Age  . Mother Deceased    Family History  Problem Relation Age of Onset  . Cancer Mother [id number]    pancreatic cancer  . Hypertension Mother   . Heart disease  Father     presumed. died at age 74     ROS:  Full 14 point review of systems complete and found to be negative unless listed above.  Physical Exam: Blood pressure 97/62, pulse 97, temperature 97.8 F (36.6 C), temperature source Oral, resp. rate 23, height 5\' 5"  (1.651 m), weight 149 lb 7.6 oz (67.8 kg), SpO2 98.00%.   General elderly white female lying in bed in NAD during HD Normal affect and insight, Not Suicidal or Homicidal, Awake Alert, Oriented X 3.  No F.N deficits, ALL C.Nerves Intact, Strength 5/5 all 4 extremities, Sensation intact all 4 extremities, Ears and Eyes appear Normal, Conjunctivae clear, PERRLA. Moist Oral Mucosa.  Supple Neck, No JVD, No cervical lymphadenopathy appriciated, No Carotid Bruits.  Symmetrical Chest wall movement, Good air movement bilaterally, CTAB.  RRR, No Gallops, Rubs or Murmurs, No Parasternal Heave.  Positive Bowel Sounds, Abdomen Soft, +ve LLQ tenderness, No organomegaly appriciated,No rebound -guarding or rigidity.  No Cyanosis, Normal Skin Turgor, No Skin Rash or Bruise. Good muscle tone, joints appear normal , no effusions, Normal ROM.   No Palpable Lymph Nodes in Neck or Axillae   Labs:   Lab Results  Component Value Date   WBC 13.3* 01/01/2014   HGB 10.2* 12/24/2013   HCT 31.2* 01/07/2014   MCV 97.5 12/17/2013   PLT 235 01/01/2014     Recent Labs Lab 01/01/2014 1001  NA 130*  K 5.1  CL 85*  CO2 25  BUN 58*  CREATININE 5.11*  CALCIUM 8.9  PROT 7.4  BILITOT 0.8  ALKPHOS 77   ALT 7  AST 12  GLUCOSE 230*  ALBUMIN 2.8*    Echo: Study Date: 06/13/2012 LV EF: 25% Study Conclusions - Left ventricle: The cavity size was at the upper limits of normal. Wall thickness was normal. The estimated ejection fraction was 25%. Diffuse hypokinesis. Doppler parameters are consistent with abnormal left ventricular relaxation (grade 1 diastolic dysfunction). - Aortic valve: There was no stenosis. - Mitral valve: Moderately calcified annulus. Mild regurgitation. - Left atrium: The atrium was mildly to moderately dilated. - Right ventricle: The cavity size was normal. Systolic function was mildly reduced. - Right atrium: The atrium was mildly dilated. - Tricuspid valve: Peak RV-RA gradient: 46mm Hg (S). - Pulmonary arteries: PA systolic pressure 57-61 mmHg. - Systemic veins: IVC measured 2.3 cm with < 50% respirophasic variation, suggesting RA pressure 11-15 mmHg. - Pericardium, extracardiac: Small posterior pericardial effusion. Impressions: - Upper normal LV size with severe global hypokinesis, EF 25%. Mild mitral regurgitation. Normal RV size with mildly decreased systolic function. Moderate pulmonary hypertension.    ECG:  HR 97, junctional rhythm   Radiology:  Ct Abdomen Pelvis W Contrast  12/20/2013   CLINICAL DATA:  Lower abdominal pain and tightness without nausea or vomiting; dialysis dependent renal failure, hysterectomy, diabetes, and CHF  EXAM: CT ABDOMEN AND PELVIS WITH CONTRAST  TECHNIQUE: Multidetector CT imaging of the abdomen and pelvis was performed using the standard protocol following bolus administration of intravenous contrast.  CONTRAST:  OMNIPAQUE IOHEXOL 300 MG/ML SOLN intravenously. The patient also received oral contrast material.  COMPARISON:  None.  FINDINGS: The mid and distal sigmoid colon exhibit abnormal wall thickening with surrounding inflammatory changes. There is perisigmoid fluid. A tiny gas collection is demonstrated on  image 51 of series 2. This is extraluminal. There is a moderate amount of stool in the sigmoid colon. More proximally the colonic stool burden is less. There is no  small bowel obstruction.  The gallbladder is adequately distended and exhibits no calcified stones. The liver, adrenal glands, pancreas, and stomach are unremarkable. There are innumerable calcifications within the normal sized spleen. The abdominal aorta exhibits mural calcification but no aneurysm. The kidneys are atrophic. There is a cysts associated with the lower pole of the left kidney measuring 3.5 cm in diameter.  There are emphysematous changes at the lung bases. There are fibrotic changes versus atelectasis posteriorly in both lower lobes. The cardiac chambers are enlarged. There are degenerative changes of the lumbar spine and of both hips.  IMPRESSION: 1. Acute mid sigmoid diverticulitis with findings worrisome for at least micro perforation. No discrete drainable abscess is demonstrated. 2. There is no acute bowel abnormality elsewhere. 3. There is no acute hepatobiliary abnormality. 4. These results were called by telephone at the time of interpretation on 12/25/2013 at 12:49 pm to Dr. Wynetta Emery , who verbally acknowledged these results.   Electronically Signed   By: David  [place]   On: 12/27/2013 12:49    ASSESSMENT AND PLAN:    Principal Problem:   Acute diverticulitis Active Problems:   ESRD (end stage renal disease)   DM (diabetes mellitus), type 2 with renal complications   Hyperlipidemia   Gout   HTN (hypertension)   Diverticulitis   Diverticulitis of colon    Kimberly Norman is a 80 y.o. female with a history of chronic systolic CHF (EF 96%- 06/2012) ESRD on dialysis MWF, HTN, HLD, type 2 DM, gout and anemia of chronic disease who comes to the hospital with abdominal pain and workup consistent with acute diverticulitis with microperforation. While on HD today she went into a junctional rhythm and had some mild  hypotension and cardiology was consulted.   Accelerated junctional Rhythm- during dialysis. Asymptomatic  -- Given one dose of 5mg  IV Lopressor and continued on home Coreg. HR stable in 90s currently. Continue to monitor. MD to see.  -- TSH pending  Acute colonic diverticulitis with microperforation in the setting of chronic constipation. -- Surgery consulted and treating conservatively for now. If not improving over the next few days may need to discuss whether she is medically able to tolerate a large surgery.  Chronic systolic CHF- appears euvolemic and not SOB. HD for volume control. -- ECHO 06/2012 - "Upper normal LV size with severe global hypokinesis, EF 25%. Mild MR. Normal RV size with mildly decreased systolic function. Moderate pulmonary htn".  ESRD. MWF dialysis schedule. Underwent dialysis today.   Hypotension. Was hypotensive down to 91/53 during dialysis. Now soft but stable BP at 108/63  DM type II. Check A1c.   Dyslipidemia continue home dose statin.     SignedThereasa Parkin, PA-C 12/09/2013 4:28 PM  Pager [phone]  Co-Sign MD  I have examined the patient and reviewed assessment and plan and discussed with patient.  Agree with above as stated.  Accelerated junctional rhythm vs. Ectopic atrial rhythm.  No symptoms.  No significant tachycardia or bradycardia.  No indication for pacer.  This is not an ischemic rhythm.  Continue conservative therapy.    Lorrena Goranson S.

## 2014-01-08 NOTE — Progress Notes (Signed)
Dialysis notified of patients accelerated junctional rhythm/EKG.

## 2014-01-08 NOTE — Progress Notes (Signed)
Kimberly Norman [id number] Admission Data: 04-12-2014 3:49 PM Attending Provider: Leroy SeaPrashant K Singh, MD  PCP:[name], Kimberly FearingJAMES, MD Consults/ Treatment Team: Treatment Team:  Md Montez Moritacs, MD Maree Krabbeobert D Schertz, MD  Kimberly Norman is a 80 y.o. female patient admitted from ED awake, alert  & orientated  X 3,  Full Code, VSS - Blood pressure 115/70, pulse 88, temperature 97.8 F (36.6 C), temperature source Oral, resp. rate 29, height 5\' 5"  (1.651 m), weight 67.8 kg (149 lb 7.6 oz), SpO2 98.00%., O2    2 L nasal cannular, no c/o shortness of breath, no c/o chest pain, no distress noted. Tele # 6 placed.   IV site WDL:  Left forearm, SL  Allergies:  No Known Allergies   Past Medical History  Diagnosis Date  . Hyperlipidemia   . Hypertension   . Diabetes mellitus Age 80    using insulin  . Arthritis   . Osteoporosis   . Cataract   . Constipation 10/26/10  . Gout   . Anemia     Receives iron infusions per nephrology  . CHF (congestive heart failure)   . On home oxygen therapy     2 l  . Anxiety     situational- surgery  . Chronic kidney disease     ESRD. Kidney dz since ~2008. s/p fistula placement.. Low office weight 143.   Marland Kitchen. Renal artery stenosis     Angiogram 2006 shoed L RAS  60% and 30% stenosis in R renal artery.       Pt orientation to unit, room and routine. Information packet given to patient/family.  Admission INP armband ID verified with patient/family, and in place. SR up x 2, fall risk assessment complete with Patient and family verbalizing understanding of risks associated with falls. Pt verbalizes an understanding of how to use the call bell and to call for help before getting out of bed.  Skin, clean-dry- intact without evidence of bruising, or skin tears.   No evidence of skin break down noted on exam.     Will cont to monitor and assist as needed.  Kern ReapBrumagin, [name], RN 04-12-2014 3:49 PM

## 2014-01-08 NOTE — Progress Notes (Signed)
Pt. Down in dialysis at this time. MD notified, pt. Going in and out of accelerated junctional rhythm.

## 2014-01-08 NOTE — Progress Notes (Signed)
ANTIBIOTIC CONSULT NOTE - INITIAL  Pharmacy Consult for Zosyn Indication: diverticulitis  No Known Allergies  Patient Measurements: Height: 5\' 5"  (165.1 cm) Weight: 150 lb (68.04 kg) IBW/kg (Calculated) : 57  Vital Signs: Temp: 98.4 F (36.9 C) (07/31 0919) Temp src: Oral (07/31 0919) BP: 112/59 mmHg (07/31 1030) Pulse Rate: 103 (07/31 1030) Intake/Output from previous day:   Intake/Output from this shift:    Labs:  Recent Labs  12/11/2013 1001  WBC 13.3*  HGB 10.2*  PLT 235  CREATININE 5.11*   Estimated Creatinine Clearance: 6.2 ml/min (by C-G formula based on Cr of 5.11). No results found for this basename: VANCOTROUGH, VANCOPEAK, VANCORANDOM, GENTTROUGH, GENTPEAK, GENTRANDOM, TOBRATROUGH, TOBRAPEAK, TOBRARND, AMIKACINPEAK, AMIKACINTROU, AMIKACIN,  in the last 72 hours   Microbiology: No results found for this or any previous visit (from the past 720 hour(s)).  Medical History: Past Medical History  Diagnosis Date  . Hyperlipidemia   . Hypertension   . Diabetes mellitus Age 80    using insulin  . Arthritis   . Osteoporosis   . Cataract   . Constipation 10/26/10  . Gout   . Anemia     Receives iron infusions per nephrology  . CHF (congestive heart failure)   . On home oxygen therapy     2 l  . Anxiety     situational- surgery  . Chronic kidney disease     ESRD. Kidney dz since ~2008. s/p fistula placement.. Low office weight 143.   Marland Kitchen. Renal artery stenosis     Angiogram 2006 shoed L RAS  60% and 30% stenosis in R renal artery.      Medications:  See electronic med rec  Assessment: 80 y.o. female presents with abd pain. To begin Zosyn for diverticulitis. Pt with ESRD (o/p HD M/W/F).  Goal of Therapy:  Resolution of infection  Plan:  1. Zosyn 2.25gm IV q8h. 2. Will f/u micro data, pt's clinical condition  Christoper Fabianaron Ayris Carano, PharmD, BCPS Clinical pharmacist, pager [phone] 12/14/2013,12:58 PM

## 2014-01-08 NOTE — ED Notes (Signed)
CT called to inform that patient is finished with oral contrast. 

## 2014-01-08 NOTE — Consult Note (Signed)
The patient is off the floor and getting dialysis.  Some episodes of SVT today.  Abdomen is tender, but no peritonitis.  Impressive changes around sigmoid colon. No need for urgent surgerym but may need something done sooner than later.  Marta LamasJames O. Gae BonWyatt, III, MD, FACS [phone]--pager [phone]--office [facility]

## 2014-01-08 NOTE — Discharge Instructions (Signed)
Metformin and X-ray Contrast Studies °For some X-ray exams, a contrast dye is used. Contrast dye is a type of medicine used to make the X-ray image clearer. The contrast dye is given to the patient through a vein (intravenously). If you need to have this type of X-ray exam and you take a medication called metformin, your caregiver may have you stop taking metformin before the exam.  °LACTIC ACIDOSIS °In rare cases, a serious medical condition called lactic acidosis can develop in people who take metformin and receive contrast dye. The following conditions can increase the risk of this complication:  °· Kidney failure. °· Liver problems. °· Certain types of heart problems such as: °¨ Heart failure. °¨ Heart attack. °¨ Heart infection. °¨ Heart valve problems. °· Alcohol abuse. °If left untreated, lactic acidosis can lead to coma.  °SYMPTOMS OF LACTIC ACIDOSIS °Symptoms of lactic acidosis can include: °· Rapid breathing (hyperventilation). °· Neurologic symptoms such as: °¨ Headaches. °¨ Confusion. °¨ Dizziness. °· Excessive sweating. °· Feeling sick to your stomach (nauseous) or throwing up (vomiting). °AFTER THE X-RAY EXAM °· Stay well-hydrated. Drink fluids as instructed by your caregiver. °· If you have a risk of developing lactic acidosis, blood tests may be done to make sure your kidney function is okay. °· Metformin is usually stopped for 48 hours after the X-ray exam. Ask your caregiver when you can start taking metformin again. °SEEK MEDICAL CARE IF:  °· You have shortness of breath or difficulty breathing. °· You develop a headache that does not go away. °· You have nausea or vomiting. °· You urinate more than normal. °· You develop a skin rash and have: °¨ Redness. °¨ Swelling. °¨ Itching. °Document Released: 05/16/2009 Document Revised: 08/20/2011 Document Reviewed: 05/16/2009 °ExitCare® Patient Information ©2015 [employer]. This information is not intended to replace advice given to you by your health  care provider. Make sure you discuss any questions you have with your health care provider. ° °

## 2014-01-08 NOTE — ED Provider Notes (Signed)
Medical screening examination/treatment/procedure(s) were conducted as a shared visit with non-physician practitioner(s) and myself.  I personally evaluated the patient during the encounter.   EKG Interpretation None      I interviewed and examined the patient. Lungs are CTAB. Cardiac exam wnl. Abdomen soft, distended, ttp in LLQ. Found to have diverticulitis w/ likely microperf. Will admit.   Junius ArgyleForrest S Colonel Krauser, MD 01/01/2014 [phone]

## 2014-01-08 NOTE — Consult Note (Signed)
[facility] Surgery Consult Note  Kimberly Norman 04-12-1919  [id number].    Requesting MD: Dr. Candiss Norse Chief Complaint/Reason for Consult: Acute sigmoid diverticulitis with microperf  HPI:  80 y.o. female, history of ESRD on dialysis MWF, HTN, HLD, type 2 DM, gout, anemia of chronic disease who comes to the hospital with 3 to four-day history of constipation associated with left lower quadrant abdominal pain, associated with anorexia, and abdominal distension, pain is constant worse with movement better with rest, nonradiating. Came to the [facility] where workup was consistent with acute diverticulitis with microperforation.  Patient denies any fever/chills, CP/SOB, diarrhea, blood in stool, or weight loss.  She does express trouble with chronic constipation and she says she has to keep up with it constantly.  She is accompanied by her niece who is at bedside. She has never had an issue with diverticulitis or any abdominal problems.  She's never had abdominal surgery or a hernia.   ROS: All systems reviewed and otherwise negative except for as above  Family History  Problem Relation Age of Onset  . Cancer Mother 19    pancreatic cancer  . Hypertension Mother   . Heart disease Father     presumed. died at age 32    Past Medical History  Diagnosis Date  . Hyperlipidemia   . Hypertension   . Diabetes mellitus Age 25    using insulin  . Arthritis   . Osteoporosis   . Cataract   . Constipation 10/26/10  . Gout   . Anemia     Receives iron infusions per nephrology  . CHF (congestive heart failure)   . On home oxygen therapy     2 l  . Anxiety     situational- surgery  . Chronic kidney disease     ESRD. Kidney dz since ~2008. s/p fistula placement.. Low office weight 143.   Marland Kitchen Renal artery stenosis     Angiogram 2006 shoed L RAS  60% and 30% stenosis in R renal artery.      Past Surgical History  Procedure Laterality Date  . Renal angiogram  2002  . Cataract extraction,  bilateral    . Hand surgery      right  . Av fistula placement  01/10/2011    Right radiocephalic AVF  . Abdominal hysterectomy    . Insertion of dialysis catheter      right shoulder  . Revison of arteriovenous fistula Right 08/19/2012    Procedure: REVISON OF ARTERIOVENOUS FISTULA;  Surgeon: Elam Dutch, MD;  Location: [facility] OR;  Service: Vascular;  Laterality: Right;  Side Branch Ligation of Right Arm AVF    Social History:  reports that she quit smoking about 32 years ago. Her smoking use included Cigarettes. She smoked 0.00 packs per day for 17 years. She has never used smokeless tobacco. She reports that she does not drink alcohol or use illicit drugs.  Allergies: No Known Allergies   (Not in a hospital admission)  Blood pressure 107/56, pulse 88, temperature 98.4 F (36.9 C), temperature source Oral, resp. rate 20, height _0  (1.651 m), weight 150 lb (68.04 kg), SpO2 91.00%. Physical Exam: General: pleasant, WD/WN white female who is laying in bed in NAD HEENT: head is normocephalic, atraumatic.  Sclera are noninjected.  PERRL.  Ears and nose without any masses or lesions.  Mouth is pink and moist Heart: regular, rate, and rhythm.  No obvious murmurs, gallops, or rubs noted.  Palpable pedal pulses bilaterally  Lungs: CTAB, no wheezes, rhonchi, or rales noted.  Respiratory effort nonlabored Abd: soft, quite distended, and tympanic, tender in the lower abdomen >suprapubic and LLQ, +BS, no masses, hernias, or organomegaly, no scars noted MS: all 4 extremities are symmetrical with no cyanosis, clubbing, or edema. Skin: warm and dry with no masses, lesions, or rashes Psych: A&Ox3 with an appropriate affect.   Results for orders placed during the hospital encounter of 01/02/2014 (from the past 48 hour(s))  LACTIC ACID, PLASMA     Status: None   Collection Time    12/20/2013  9:46 AM      Result Value Ref Range   Lactic Acid, Venous 2.2  0.5 - 2.2 mmol/L  BASIC METABOLIC PANEL      Status: Abnormal   Collection Time    12/24/2013 10:01 AM      Result Value Ref Range   Sodium 130 (*) 137 - 147 mEq/L   Potassium 5.1  3.7 - 5.3 mEq/L   Chloride 85 (*) 96 - 112 mEq/L   CO2 25  19 - 32 mEq/L   Glucose, Bld 230 (*) 70 - 99 mg/dL   BUN 58 (*) 6 - 23 mg/dL   Creatinine, Ser 5.11 (*) 0.50 - 1.10 mg/dL   Calcium 8.9  8.4 - 10.5 mg/dL   GFR calc non Af Amer 7 (*) >90 mL/min   GFR calc Af Amer 8 (*) >90 mL/min   Comment: (NOTE)     The eGFR has been calculated using the CKD EPI equation.     This calculation has not been validated in all clinical situations.     eGFR's persistently <90 mL/min signify possible Chronic Kidney     Disease.   Anion gap 20 (*) 5 - 15  CBC WITH DIFFERENTIAL     Status: Abnormal   Collection Time    12/17/2013 10:01 AM      Result Value Ref Range   WBC 13.3 (*) 4.0 - 10.5 K/uL   RBC 3.20 (*) 3.87 - 5.11 MIL/uL   Hemoglobin 10.2 (*) 12.0 - 15.0 g/dL   HCT 31.2 (*) 36.0 - 46.0 %   MCV 97.5  78.0 - 100.0 fL   MCH 31.9  26.0 - 34.0 pg   MCHC 32.7  30.0 - 36.0 g/dL   RDW 14.1  11.5 - 15.5 %   Platelets 235  150 - 400 K/uL   Neutrophils Relative % 92 (*) 43 - 77 %   Neutro Abs 12.2 (*) 1.7 - 7.7 K/uL   Lymphocytes Relative 3 (*) 12 - 46 %   Lymphs Abs 0.4 (*) 0.7 - 4.0 K/uL   Monocytes Relative 5  3 - 12 %   Monocytes Absolute 0.7  0.1 - 1.0 K/uL   Eosinophils Relative 0  0 - 5 %   Eosinophils Absolute 0.0  0.0 - 0.7 K/uL   Basophils Relative 0  0 - 1 %   Basophils Absolute 0.0  0.0 - 0.1 K/uL  HEPATIC FUNCTION PANEL     Status: Abnormal   Collection Time    12/22/2013 10:01 AM      Result Value Ref Range   Total Protein 7.4  6.0 - 8.3 g/dL   Albumin 2.8 (*) 3.5 - 5.2 g/dL   AST 12  0 - 37 U/L   ALT 7  0 - 35 U/L   Alkaline Phosphatase 77  39 - 117 U/L   Total Bilirubin 0.8  0.3 - 1.2  mg/dL   Bilirubin, Direct 0.3  0.0 - 0.3 mg/dL   Indirect Bilirubin 0.5  0.3 - 0.9 mg/dL  URINALYSIS, ROUTINE W REFLEX MICROSCOPIC     Status: Abnormal    Collection Time    12/16/2013 11:54 AM      Result Value Ref Range   Color, Urine AMBER (*) YELLOW   Comment: BIOCHEMICALS MAY BE AFFECTED BY COLOR   APPearance CLOUDY (*) CLEAR   Specific Gravity, Urine 1.019  1.005 - 1.030   pH 6.5  5.0 - 8.0   Glucose, UA NEGATIVE  NEGATIVE mg/dL   Hgb urine dipstick SMALL (*) NEGATIVE   Bilirubin Urine SMALL (*) NEGATIVE   Ketones, ur NEGATIVE  NEGATIVE mg/dL   Protein, ur >300 (*) NEGATIVE mg/dL   Urobilinogen, UA 1.0  0.0 - 1.0 mg/dL   Nitrite NEGATIVE  NEGATIVE   Leukocytes, UA NEGATIVE  NEGATIVE  URINE MICROSCOPIC-ADD ON     Status: Abnormal   Collection Time    01/04/2014 11:54 AM      Result Value Ref Range   Squamous Epithelial / LPF FEW (*) RARE   WBC, UA 0-2  <3 WBC/hpf   Bacteria, UA MANY (*) RARE   Urine-Other AMORPHOUS URATES/PHOSPHATES     Ct Abdomen Pelvis W Contrast  12/27/2013   CLINICAL DATA:  Lower abdominal pain and tightness without nausea or vomiting; dialysis dependent renal failure, hysterectomy, diabetes, and CHF  EXAM: CT ABDOMEN AND PELVIS WITH CONTRAST  TECHNIQUE: Multidetector CT imaging of the abdomen and pelvis was performed using the standard protocol following bolus administration of intravenous contrast.  CONTRAST:  146m OMNIPAQUE IOHEXOL 300 MG/ML SOLN intravenously. The patient also received oral contrast material.  COMPARISON:  None.  FINDINGS: The mid and distal sigmoid colon exhibit abnormal wall thickening with surrounding inflammatory changes. There is perisigmoid fluid. A tiny gas collection is demonstrated on image 51 of series 2. This is extraluminal. There is a moderate amount of stool in the sigmoid colon. More proximally the colonic stool burden is less. There is no small bowel obstruction.  The gallbladder is adequately distended and exhibits no calcified stones. The liver, adrenal glands, pancreas, and stomach are unremarkable. There are innumerable calcifications within the normal sized spleen. The abdominal  aorta exhibits mural calcification but no aneurysm. The kidneys are atrophic. There is a cysts associated with the lower pole of the left kidney measuring 3.5 cm in diameter.  There are emphysematous changes at the lung bases. There are fibrotic changes versus atelectasis posteriorly in both lower lobes. The cardiac chambers are enlarged. There are degenerative changes of the lumbar spine and of both hips.  IMPRESSION: 1. Acute mid sigmoid diverticulitis with findings worrisome for at least micro perforation. No discrete drainable abscess is demonstrated. 2. There is no acute bowel abnormality elsewhere. 3. There is no acute hepatobiliary abnormality. 4. These results were called by telephone at the time of interpretation on 01/07/2014 at 12:49 pm to Dr. NMonico Blitz, who verbally acknowledged these results.   Electronically Signed   By: David  JMartinique  On: 12/20/2013 12:49      Assessment/Plan Acute sigmoid diverticulitis with microperforation without abscess Leukocytosis - 13.1 ESRD on dialysis - Cr. 5.11  Plan: 1.  Admit to medicine, we will consult, nephrology to see as well 2.  NPO, bowel rest, IVF, pain control, antiemetics, antibiotics (Zosyn Day #1) 3.  Treat conservatively for now.  If not improving over the next few days may need  to discuss whether she is medically able to tolerate a large surgery. 4.  DVT proph okay, SCD's 5.  Ambulate and IS   Coralie Keens, [facility] Surgery 12/11/2013, 1:17 PM Pager: [phone]

## 2014-01-08 NOTE — H&P (Addendum)
Patient Demographics  Kimberly Norman, is a 80 y.o. female  MRN: [id number]   DOB - Dec 02, 1918  Admit Date - 12/15/2013  Outpatient Primary MD for the patient is [name], MD   With History of -  Past Medical History  Diagnosis Date  . Hyperlipidemia   . Hypertension   . Diabetes mellitus Age 68    using insulin  . Arthritis   . Osteoporosis   . Cataract   . Constipation 10/26/10  . Gout   . Anemia     Receives iron infusions per nephrology  . CHF (congestive heart failure)   . On home oxygen therapy     2 l  . Anxiety     situational- surgery  . Chronic kidney disease     ESRD. Kidney dz since ~2008. s/p fistula placement.. Low office weight 143.   Marland Kitchen Renal artery stenosis     Angiogram 2006 shoed L RAS  60% and 30% stenosis in R renal artery.        Past Surgical History  Procedure Laterality Date  . Renal angiogram  2002  . Cataract extraction, bilateral    . Hand surgery      right  . Av fistula placement  01/10/2011    Right radiocephalic AVF  . Abdominal hysterectomy    . Insertion of dialysis catheter      right shoulder  . Revison of arteriovenous fistula Right 08/19/2012    Procedure: REVISON OF ARTERIOVENOUS FISTULA;  Surgeon: Sherren Kerns, MD;  Location: [facility] OR;  Service: Vascular;  Laterality: Right;  Side Branch Ligation of Right Arm AVF    in for   Chief Complaint  Patient presents with  . Abdominal Pain     HPI  Kimberly Norman  is a 80 y.o. female, history of ESRD on Monday Wednesday Friday dialysis, hypertension, dyslipidemia, type 2 diabetes mellitus, gout, anemia of chronic disease who comes to the hospital with 3 to four-day history of constipation associated with left lower quadrant abdominal pain, pain is constant worse with movement better with rest, nonradiating,  associated constipation. Came to the ER where workup was consistent with acute diverticulitis with microperforation. House call to admit the patient with the same.  Patient denies any fever chills, no headache, no chest pain palpitations cough phlegm shortness of breath, no proceeding diarrhea but there is constipation for the past 34 days and she chronically has this issue also, no blood or mucus in stool, she claims to have never had colonoscopy in the past, no personal history of diverticulosis or colonic polyps, no colon cancer in her or in her family. No unexplained weight loss. No focal weakness.    Review of Systems    In addition to the HPI above,   No Fever-chills, No Headache, No changes with Vision or hearing, No problems swallowing food or Liquids, No Chest pain, Cough or Shortness of Breath, +LLQ Abdominal pain, No Nausea  or Vommitting,  Is constipated No Blood in stool or Urine, No dysuria, No new skin rashes or bruises, No new joints pains-aches,  No new weakness, tingling, numbness in any extremity, No recent weight gain or loss, No polyuria, polydypsia or polyphagia, No significant Mental Stressors.  A full 10 point Review of Systems was done, except as stated above, all other Review of Systems were negative.   Social History History  Substance Use Topics  . Smoking status: Former Smoker -- 17 years    Types: Cigarettes    Quit date: 01/24/1981  . Smokeless tobacco: Never Used  . Alcohol Use: No      Family History Family History  Problem Relation Age of Onset  . Cancer Mother [id number]    pancreatic cancer  . Hypertension Mother   . Heart disease Father     presumed. died at age 78      Prior to Admission medications   Medication Sig Start Date End Date Taking? Authorizing Provider  albuterol (PROVENTIL HFA;VENTOLIN HFA) 108 (90 BASE) MCG/ACT inhaler Inhale 2 puffs into the lungs every 6 (six) hours as needed. For shortness of breath   Yes Historical  Provider, MD  allopurinol (ZYLOPRIM) 100 MG tablet Take 50 mg by mouth every morning.   Yes Historical Provider, MD  atorvastatin (LIPITOR) 40 MG tablet Take 40 mg by mouth daily.    Yes Historical Provider, MD  carvedilol (COREG) 3.125 MG tablet Take 3.125 mg by mouth 2 (two) times daily with a meal.   Yes Historical Provider, MD  cyanocobalamin 2000 MCG tablet Take 2,000 mcg by mouth daily.   Yes Historical Provider, MD  docusate sodium (COLACE) 100 MG capsule Take 100 mg by mouth 2 (two) times daily as needed. For constipation   Yes Historical Provider, MD  glimepiride (AMARYL) 1 MG tablet Take 0.5 mg by mouth daily with breakfast.   Yes Historical Provider, MD  HYDROcodone-acetaminophen (NORCO/VICODIN) 5-325 MG per tablet Take 1-2 tablets by mouth every 6 (six) hours as needed for moderate pain (coughing or anxiety).   Yes Historical Provider, MD  hydrOXYzine (ATARAX/VISTARIL) 25 MG tablet Take 25 mg by mouth daily as needed for anxiety or itching (give only as needed).   Yes Historical Provider, MD  linagliptin (TRADJENTA) 5 MG TABS tablet Take 1.25 mg by mouth daily.   Yes Historical Provider, MD  LORazepam (ATIVAN) 0.5 MG tablet Take 0.5 mg by mouth 2 (two) times daily as needed for anxiety.   Yes Historical Provider, MD  polyethylene glycol (MIRALAX / GLYCOLAX) packet Take 17 g by mouth daily as needed. For constipation   Yes Historical Provider, MD  sorbitol 70 % solution Take 60 mLs by mouth daily as needed (for constipation).    Yes Historical Provider, MD  VELPHORO 500 MG chewable tablet Chew 500 mg by mouth 3 (three) times daily with meals. 12/04/13  Yes Historical Provider, MD  zolpidem (AMBIEN) 5 MG tablet Take 5 mg by mouth at bedtime as needed (insomnia).    Historical Provider, MD    No Known Allergies  Physical Exam  Vitals  Blood pressure 107/56, pulse 88, temperature 98.4 F (36.9 C), temperature source Oral, resp. rate 20, height 5\' 5"  (1.651 m), weight 68.04 kg (150 lb),  SpO2 91.00%.   1. General elderly white female lying in bed in NAD,     2. Normal affect and insight, Not Suicidal or Homicidal, Awake Alert, Oriented X 3.  3. No F.N deficits, ALL C.Nerves  Intact, Strength 5/5 all 4 extremities, Sensation intact all 4 extremities, Plantars down going.  4. Ears and Eyes appear Normal, Conjunctivae clear, PERRLA. Moist Oral Mucosa.  5. Supple Neck, No JVD, No cervical lymphadenopathy appriciated, No Carotid Bruits.  6. Symmetrical Chest wall movement, Good air movement bilaterally, CTAB.  7. RRR, No Gallops, Rubs or Murmurs, No Parasternal Heave.  8. Positive Bowel Sounds, Abdomen Soft, +ve LLQ tenderness, No organomegaly appriciated,No rebound -guarding or rigidity.  9.  No Cyanosis, Normal Skin Turgor, No Skin Rash or Bruise.  10. Good muscle tone,  joints appear normal , no effusions, Normal ROM.  11. No Palpable Lymph Nodes in Neck or Axillae     Data Review  CBC  Recent Labs Lab 01-25-14 1001  WBC 13.3*  HGB 10.2*  HCT 31.2*  PLT 235  MCV 97.5  MCH 31.9  MCHC 32.7  RDW 14.1  LYMPHSABS 0.4*  MONOABS 0.7  EOSABS 0.0  BASOSABS 0.0   ------------------------------------------------------------------------------------------------------------------  Chemistries   Recent Labs Lab 2014/01/25 1001  NA 130*  K 5.1  CL 85*  CO2 25  GLUCOSE 230*  BUN 58*  CREATININE 5.11*  CALCIUM 8.9  AST 12  ALT 7  ALKPHOS 77  BILITOT 0.8   ------------------------------------------------------------------------------------------------------------------ estimated creatinine clearance is 6.2 ml/min (by C-G formula based on Cr of 5.11). ------------------------------------------------------------------------------------------------------------------ No results found for this basename: TSH, T4TOTAL, FREET3, T3FREE, THYROIDAB,  in the last 72 hours   Coagulation profile No results found for this basename: INR, PROTIME,  in the last 168  hours ------------------------------------------------------------------------------------------------------------------- No results found for this basename: DDIMER,  in the last 72 hours -------------------------------------------------------------------------------------------------------------------  Cardiac Enzymes No results found for this basename: CK, CKMB, TROPONINI, MYOGLOBIN,  in the last 168 hours ------------------------------------------------------------------------------------------------------------------ No components found with this basename: POCBNP,    ---------------------------------------------------------------------------------------------------------------  Urinalysis    Component Value Date/Time   COLORURINE AMBER* 25-Jan-2014 1154   APPEARANCEUR CLOUDY* 01/25/14 1154   LABSPEC 1.019 2014/01/25 1154   PHURINE 6.5 2014/01/25 1154   GLUCOSEU NEGATIVE 2014-01-25 1154   HGBUR SMALL* 01/25/2014 1154   BILIRUBINUR SMALL* 2014-01-25 1154   KETONESUR NEGATIVE 2014/01/25 1154   PROTEINUR >300* 01/25/14 1154   UROBILINOGEN 1.0 01-25-14 1154   NITRITE NEGATIVE 01-25-2014 1154   LEUKOCYTESUR NEGATIVE 25-Jan-2014 1154    ----------------------------------------------------------------------------------------------------------------  Imaging results:   Ct Abdomen Pelvis W Contrast  01/25/2014   CLINICAL DATA:  Lower abdominal pain and tightness without nausea or vomiting; dialysis dependent renal failure, hysterectomy, diabetes, and CHF  EXAM: CT ABDOMEN AND PELVIS WITH CONTRAST  TECHNIQUE: Multidetector CT imaging of the abdomen and pelvis was performed using the standard protocol following bolus administration of intravenous contrast.  CONTRAST:  OMNIPAQUE IOHEXOL 300 MG/ML SOLN intravenously. The patient also received oral contrast material.  COMPARISON:  None.  FINDINGS: The mid and distal sigmoid colon exhibit abnormal wall thickening with surrounding inflammatory  changes. There is perisigmoid fluid. A tiny gas collection is demonstrated on image 51 of series 2. This is extraluminal. There is a moderate amount of stool in the sigmoid colon. More proximally the colonic stool burden is less. There is no small bowel obstruction.  The gallbladder is adequately distended and exhibits no calcified stones. The liver, adrenal glands, pancreas, and stomach are unremarkable. There are innumerable calcifications within the normal sized spleen. The abdominal aorta exhibits mural calcification but no aneurysm. The kidneys are atrophic. There is a cysts associated with the lower pole of the left kidney measuring  3.5 cm in diameter.  There are emphysematous changes at the lung bases. There are fibrotic changes versus atelectasis posteriorly in both lower lobes. The cardiac chambers are enlarged. There are degenerative changes of the lumbar spine and of both hips.  IMPRESSION: 1. Acute mid sigmoid diverticulitis with findings worrisome for at least micro perforation. No discrete drainable abscess is demonstrated. 2. There is no acute bowel abnormality elsewhere. 3. There is no acute hepatobiliary abnormality. 4. These results were called by telephone at the time of interpretation on 01/02/2014 at 12:49 pm to Dr. Wynetta Emery , who verbally acknowledged these results.   Electronically Signed   By: David  [place]   On: 01/01/2014 12:49         Assessment & Plan   1. Acute colonic diverticulitis with microperforation in the setting of chronic constipation. Blood cultures drawn, patient will be admitted to a telemetry bed, bowel rest, gentle IV fluids as she has ESRD, IV antibiotics, surgery will be consulted the ER. Stool softeners    2. ESRD. Monday Wednesday Friday dialysis schedule, do today. Renal will be informed.    3. Hypertension. Stable we'll continue oral antihypertensives with a sip of water.    4. DM type II. On oral hypoglycemics, hold, every 6 sliding  scale. Check A1c.    5. Dyslipidemia continue home dose statin.    6.AOCD - stable.   Addendum - in dialysis going in and out of [facility] junctional Rhythm, get EKG, IV Lopressor PRN, TSH, cards consult, PO coreg.      DVT Prophylaxis Heparin    AM Labs Ordered, also please review Full Orders  Family Communication: Admission, patients condition and plan of care including tests being ordered have been discussed with the patient and daughter who indicate understanding and agree with the plan and Code Status.  Code Status Full  Likely DC to TBD  Condition GUARDED     Time spent in minutes : 35    SINGH,PRASHANT K M.D on 12/20/2013 at 1:31 PM  Between 7am to 7pm - Pager - [phone]  After 7pm go to [email or web address] - password [email or web address]  And look for the night coverage person covering me after hours  [facility] Office  [phone]   **Disclaimer: This note may have been dictated with voice recognition software. Similar sounding words can inadvertently be transcribed and this note may contain transcription errors which may not have been corrected upon publication of note.**

## 2014-01-08 NOTE — ED Notes (Signed)
Patient complaint of lower abdominal pain since yesterday. Patient states it is more of a "tight" feeling in her abdomen than pain.  Denies any nausea or vomiting.  Patient also states she is supposed to go to dialysis today.

## 2014-01-08 NOTE — ED Provider Notes (Signed)
CSN: [id number]     Arrival date & time 2014-02-02  0912 History   First MD Initiated Contact with Patient 2014-02-02 [phone]     Chief Complaint  Patient presents with  . Abdominal Pain     (Consider location/radiation/quality/duration/timing/severity/associated sxs/prior Treatment) HPI  Kimberly Norman is a 80 y.o. female accompanied by her niece, with past medical history significant for insulin-dependent diabetes, ESRD (patient was fully dialyzed 2 days ago), CHF, with home oxygen complaining of severe left lower quadrant abdominal pain onset yesterday, patient has had increasing abdominal girth, decreased by mouth intake, no bowel movements in the last 24 hours. Patient states she is a lot of flatus. No prior similar episodes. Patient denies fever, chills, nausea, vomiting, chest pain, shortness of breath. States that the abdominal pain is significantly worsened with movement and standing. O2 sat is normally around 95% on room air as per niece  Past Medical History  Diagnosis Date  . Hyperlipidemia   . Hypertension   . Diabetes mellitus Age 71    using insulin  . Arthritis   . Osteoporosis   . Cataract   . Constipation 10/26/10  . Gout   . Anemia     Receives iron infusions per nephrology  . CHF (congestive heart failure)   . On home oxygen therapy     2 l  . Anxiety     situational- surgery  . Chronic kidney disease     ESRD. Kidney dz since ~2008. s/p fistula placement.. Low office weight 143.   Marland Kitchen Renal artery stenosis     Angiogram 2006 shoed L RAS  60% and 30% stenosis in R renal artery.     Past Surgical History  Procedure Laterality Date  . Renal angiogram  2002  . Cataract extraction, bilateral    . Hand surgery      right  . Av fistula placement  01/10/2011    Right radiocephalic AVF  . Abdominal hysterectomy    . Insertion of dialysis catheter      right shoulder  . Revison of arteriovenous fistula Right 08/19/2012    Procedure: REVISON OF ARTERIOVENOUS FISTULA;   Surgeon: Sherren Kerns, MD;  Location: [facility] OR;  Service: Vascular;  Laterality: Right;  Side Branch Ligation of Right Arm AVF   Family History  Problem Relation Age of Onset  . Cancer Mother 70    pancreatic cancer  . Hypertension Mother   . Heart disease Father     presumed. died at age 33   History  Substance Use Topics  . Smoking status: Former Smoker -- 17 years    Types: Cigarettes    Quit date: 01/24/1981  . Smokeless tobacco: Never Used  . Alcohol Use: No   OB History   Grav Para Term Preterm Abortions TAB SAB Ect Mult Living                 Review of Systems  10 systems reviewed and found to be negative, except as noted in the HPI.   Allergies  Review of patient's allergies indicates no known allergies.  Home Medications   Prior to Admission medications   Medication Sig Start Date End Date Taking? Authorizing Provider  albuterol (PROVENTIL HFA;VENTOLIN HFA) 108 (90 BASE) MCG/ACT inhaler Inhale 2 puffs into the lungs every 6 (six) hours as needed. For shortness of breath   Yes Historical Provider, MD  allopurinol (ZYLOPRIM) 100 MG tablet Take 50 mg by mouth every morning.   Yes  Historical Provider, MD  atorvastatin (LIPITOR) 40 MG tablet Take 40 mg by mouth daily.    Yes Historical Provider, MD  carvedilol (COREG) 3.125 MG tablet Take 3.125 mg by mouth 2 (two) times daily with a meal.   Yes Historical Provider, MD  cyanocobalamin 2000 MCG tablet Take 2,000 mcg by mouth daily.   Yes Historical Provider, MD  docusate sodium (COLACE) 100 MG capsule Take 100 mg by mouth 2 (two) times daily as needed. For constipation   Yes Historical Provider, MD  glimepiride (AMARYL) 1 MG tablet Take 0.5 mg by mouth daily with breakfast.   Yes Historical Provider, MD  HYDROcodone-acetaminophen (NORCO/VICODIN) 5-325 MG per tablet Take 1-2 tablets by mouth every 6 (six) hours as needed for moderate pain (coughing or anxiety).   Yes Historical Provider, MD  hydrOXYzine (ATARAX/VISTARIL)  25 MG tablet Take 25 mg by mouth daily as needed for anxiety or itching (give only as needed).   Yes Historical Provider, MD  linagliptin (TRADJENTA) 5 MG TABS tablet Take 1.25 mg by mouth daily.   Yes Historical Provider, MD  LORazepam (ATIVAN) 0.5 MG tablet Take 0.5 mg by mouth 2 (two) times daily as needed for anxiety.   Yes Historical Provider, MD  polyethylene glycol (MIRALAX / GLYCOLAX) packet Take 17 g by mouth daily as needed. For constipation   Yes Historical Provider, MD  sorbitol 70 % solution Take 60 mLs by mouth daily as needed (for constipation).    Yes Historical Provider, MD  VELPHORO 500 MG chewable tablet Chew 500 mg by mouth 3 (three) times daily with meals. 12/04/13  Yes Historical Provider, MD  zolpidem (AMBIEN) 5 MG tablet Take 5 mg by mouth at bedtime as needed (insomnia).    Historical Provider, MD   BP 107/56  Pulse 88  Temp(Src) 98.4 F (36.9 C) (Oral)  Resp 20  Ht 5\' 5"  (1.651 m)  Wt 150 lb (68.04 kg)  BMI 24.96 kg/m2  SpO2 91% Physical Exam  Nursing note and vitals reviewed. Constitutional: She is oriented to person, place, and time. She appears well-developed and well-nourished. No distress.  In Trendelenburg, eyes closed, in no acute distress. Patient is satting at 90% with good waveform, mildly tachycardic in the 110s  HENT:  Head: Normocephalic.  Extremely dry mucous membranes, tongue is black. Denies any oral intake that would cause a dark tongue.  Eyes: Conjunctivae and EOM are normal.  Cardiovascular: Normal rate, regular rhythm and intact distal pulses.   Right forearm fistula with good thrill  Pulmonary/Chest: Effort normal and breath sounds normal. No stridor. No respiratory distress. She has no wheezes. She has no rales. She exhibits no tenderness.  Abdominal:  Abdomen is distended, hypoactive bowel sounds, patient is exquisitely tender to palpation left lower quadrant. There is voluntary guarding and no rebound.  Musculoskeletal: Normal range of  motion.  Neurological: She is alert and oriented to person, place, and time.  Psychiatric: She has a normal mood and affect.    ED Course  Procedures (including critical care time) Labs Review Labs Reviewed  BASIC METABOLIC PANEL - Abnormal; Notable for the following:    Sodium 130 (*)    Chloride 85 (*)    Glucose, Bld 230 (*)    BUN 58 (*)    Creatinine, Ser 5.11 (*)    GFR calc non Af Amer 7 (*)    GFR calc Af Amer 8 (*)    Anion gap 20 (*)    All other components within  normal limits  CBC WITH DIFFERENTIAL - Abnormal; Notable for the following:    WBC 13.3 (*)    RBC 3.20 (*)    Hemoglobin 10.2 (*)    HCT 31.2 (*)    Neutrophils Relative % 92 (*)    Neutro Abs 12.2 (*)    Lymphocytes Relative 3 (*)    Lymphs Abs 0.4 (*)    All other components within normal limits  HEPATIC FUNCTION PANEL - Abnormal; Notable for the following:    Albumin 2.8 (*)    All other components within normal limits  URINALYSIS, ROUTINE W REFLEX MICROSCOPIC - Abnormal; Notable for the following:    Color, Urine AMBER (*)    APPearance CLOUDY (*)    Hgb urine dipstick SMALL (*)    Bilirubin Urine SMALL (*)    Protein, ur >300 (*)    All other components within normal limits  URINE MICROSCOPIC-ADD ON - Abnormal; Notable for the following:    Squamous Epithelial / LPF FEW (*)    Bacteria, UA MANY (*)    All other components within normal limits  LACTIC ACID, PLASMA    Imaging Review Ct Abdomen Pelvis W Contrast  12/22/2013   CLINICAL DATA:  Lower abdominal pain and tightness without nausea or vomiting; dialysis dependent renal failure, hysterectomy, diabetes, and CHF  EXAM: CT ABDOMEN AND PELVIS WITH CONTRAST  TECHNIQUE: Multidetector CT imaging of the abdomen and pelvis was performed using the standard protocol following bolus administration of intravenous contrast.  CONTRAST:  OMNIPAQUE IOHEXOL 300 MG/ML SOLN intravenously. The patient also received oral contrast material.  COMPARISON:   None.  FINDINGS: The mid and distal sigmoid colon exhibit abnormal wall thickening with surrounding inflammatory changes. There is perisigmoid fluid. A tiny gas collection is demonstrated on image 51 of series 2. This is extraluminal. There is a moderate amount of stool in the sigmoid colon. More proximally the colonic stool burden is less. There is no small bowel obstruction.  The gallbladder is adequately distended and exhibits no calcified stones. The liver, adrenal glands, pancreas, and stomach are unremarkable. There are innumerable calcifications within the normal sized spleen. The abdominal aorta exhibits mural calcification but no aneurysm. The kidneys are atrophic. There is a cysts associated with the lower pole of the left kidney measuring 3.5 cm in diameter.  There are emphysematous changes at the lung bases. There are fibrotic changes versus atelectasis posteriorly in both lower lobes. The cardiac chambers are enlarged. There are degenerative changes of the lumbar spine and of both hips.  IMPRESSION: 1. Acute mid sigmoid diverticulitis with findings worrisome for at least micro perforation. No discrete drainable abscess is demonstrated. 2. There is no acute bowel abnormality elsewhere. 3. There is no acute hepatobiliary abnormality. 4. These results were called by telephone at the time of interpretation on 01/03/2014 at 12:49 pm to Dr. Wynetta Emery , who verbally acknowledged these results.   Electronically Signed   By: David  [place]   On: 01/06/2014 12:49     EKG Interpretation None      MDM   Final diagnoses:  Diverticulitis large intestine w/o perforation or abscess w/o bleeding  End stage renal disease    Filed Vitals:   12/28/2013 1100 12/24/2013 1115 12/12/2013 1130 12/16/2013 1300  BP: 120/60 109/58 112/63 107/56  Pulse: 88 90 102 88  Temp:      TempSrc:      Resp:      Height:      Weight:  SpO2: 83% 86% 82% 91%    Medications  iohexol (OMNIPAQUE) 300 MG/ML solution 25  mL (25 mLs Oral Contrast Given 01-17-2014 1043)  piperacillin-tazobactam (ZOSYN) IVPB 2.25 g (not administered)  0.9 %  sodium chloride infusion (not administered)  morphine 2 MG/ML injection 2 mg (2 mg Intravenous Given 2014/01/17 1108)  0.9 %  sodium chloride infusion ( Intravenous New Bag/Given 01-17-2014 1257)  iohexol (OMNIPAQUE) 300 MG/ML solution 100 mL (100 mLs Intravenous Contrast Given 2014/01/17 1210)    RONETTA MOLLA is a 80 y.o. female presenting with severe abdominal pain, hypoactive bowel sounds, patient is exquisitely tender to palpation along the left lower cord. Patient is hypoxic to 90% on room air it seems that she has home O2 but is noncompliant with that. As per daughter she normally sats above 95%. Patient is yesterday and was fully dialyzed 2 days ago. Patient has a leukocytosis of 13.3. She has an anion gap of 20 however this is difficult to interpret in the setting of renal failure. Lactic acid is normal at 2.2. No significant electrolyte abnormalities.  11:55 AM: Patient seen and evaluated at the bedside, she reports treatment in pain. Repeat abdominal exam with less tenderness to palpation on the left lower quadrant however there still voluntary guarding.   Verbal report from radiologist appreciated colon that she has acute sigmoid diverticulitis with microperforation's, no discrete abscess, there is free fluid.  Case discussed with hospitalist Dr. Thedore Mins who accepts admission and request both surgery and nephrology consult. Patient will be admitted to a telemetry bed.  Surgery consult from [facility] PA Meagan Dort (attending physician Dr. Lindie Spruce) appreciated: they will come to evaluate the patient in the ED and consult on the patient the floor  Nephrology consult from Dr.Shertz appreciated: nephrology will consult.   This is a shared visit with the attending physician who personally evaluated the patient and agrees with the care plan.       Wynetta Emery, PA-C 01/17/14 1345

## 2014-01-08 NOTE — Consult Note (Signed)
Indication for Consultation:  Management of ESRD/hemodialysis; anemia, hypertension/volume and secondary hyperparathyroidism  HPI: Kimberly Norman is a 80 y.o. female who presented to the ED this am with complaints of abdominal discomfort which started Wednesday. She receives HD MWF @ [facility], last HD wends, HTN and DM. She reports lower abdominal discomfort began on Wednesday, she had poor appetite but denies nausea and vomiting. Workup showed acute diverticulitis, she will be admitted for management. Will arrange HD today to keep on schedule. She reports she gets very uncomfortable the last 2 hours of HD, daughter is asking if she can be medicated with morphine or ativan for treatments after DC due to discomfort, explained this cannon be done at outpt center.   Past Medical History  Diagnosis Date  . Hyperlipidemia   . Hypertension   . Diabetes mellitus Age 85    using insulin  . Arthritis   . Osteoporosis   . Cataract   . Constipation 10/26/10  . Gout   . Anemia     Receives iron infusions per nephrology  . CHF (congestive heart failure)   . On home oxygen therapy     2 l  . Anxiety     situational- surgery  . Chronic kidney disease     ESRD. Kidney dz since ~2008. s/p fistula placement.. Low office weight 143.   Marland Kitchen Renal artery stenosis     Angiogram 2006 shoed L RAS  60% and 30% stenosis in R renal artery.     Past Surgical History  Procedure Laterality Date  . Renal angiogram  2002  . Cataract extraction, bilateral    . Hand surgery      right  . Av fistula placement  01/10/2011    Right radiocephalic AVF  . Abdominal hysterectomy    . Insertion of dialysis catheter      right shoulder  . Revison of arteriovenous fistula Right 08/19/2012    Procedure: REVISON OF ARTERIOVENOUS FISTULA;  Surgeon: Sherren Kerns, MD;  Location: [facility] OR;  Service: Vascular;  Laterality: Right;  Side Branch Ligation of Right Arm AVF   Family History  Problem Relation Age of Onset  . Cancer Mother 58     pancreatic cancer  . Hypertension Mother   . Heart disease Father     presumed. died at age 42   Social History:  reports that she quit smoking about 32 years ago. Her smoking use included Cigarettes. She smoked 0.00 packs per day for 17 years. She has never used smokeless tobacco. She reports that she does not drink alcohol or use illicit drugs. No Known Allergies Prior to Admission medications   Medication Sig Start Date End Date Taking? Authorizing Provider  albuterol (PROVENTIL HFA;VENTOLIN HFA) 108 (90 BASE) MCG/ACT inhaler Inhale 2 puffs into the lungs every 6 (six) hours as needed. For shortness of breath   Yes Historical Provider, MD  allopurinol (ZYLOPRIM) 100 MG tablet Take 50 mg by mouth every morning.   Yes Historical Provider, MD  atorvastatin (LIPITOR) 40 MG tablet Take 40 mg by mouth daily.    Yes Historical Provider, MD  carvedilol (COREG) 3.125 MG tablet Take 3.125 mg by mouth 2 (two) times daily with a meal.   Yes Historical Provider, MD  cyanocobalamin 2000 MCG tablet Take 2,000 mcg by mouth daily.   Yes Historical Provider, MD  docusate sodium (COLACE) 100 MG capsule Take 100 mg by mouth 2 (two) times daily as needed. For constipation   Yes Historical  Provider, MD  glimepiride (AMARYL) 1 MG tablet Take 0.5 mg by mouth daily with breakfast.   Yes Historical Provider, MD  HYDROcodone-acetaminophen (NORCO/VICODIN) 5-325 MG per tablet Take 1-2 tablets by mouth every 6 (six) hours as needed for moderate pain (coughing or anxiety).   Yes Historical Provider, MD  hydrOXYzine (ATARAX/VISTARIL) 25 MG tablet Take 25 mg by mouth daily as needed for anxiety or itching (give only as needed).   Yes Historical Provider, MD  linagliptin (TRADJENTA) 5 MG TABS tablet Take 1.25 mg by mouth daily.   Yes Historical Provider, MD  LORazepam (ATIVAN) 0.5 MG tablet Take 0.5 mg by mouth 2 (two) times daily as needed for anxiety.   Yes Historical Provider, MD  polyethylene glycol (MIRALAX /  GLYCOLAX) packet Take 17 g by mouth daily as needed. For constipation   Yes Historical Provider, MD  sorbitol 70 % solution Take 60 mLs by mouth daily as needed (for constipation).    Yes Historical Provider, MD  VELPHORO 500 MG chewable tablet Chew 500 mg by mouth 3 (three) times daily with meals. 12/04/13  Yes Historical Provider, MD  zolpidem (AMBIEN) 5 MG tablet Take 5 mg by mouth at bedtime as needed (insomnia).    Historical Provider, MD   Current Facility-Administered Medications  Medication Dose Route Frequency Provider Last Rate Last Dose  . 0.9 %  sodium chloride infusion   Intravenous Continuous Leroy Sea, MD      . piperacillin-tazobactam (ZOSYN) IVPB 2.25 g  2.25 g Intravenous 3 times per day Hilario Quarry Amend, RPH 100 mL/hr at January 28, 2014 1356 2.25 g at Jan 28, 2014 1356   Current Outpatient Prescriptions  Medication Sig Dispense Refill  . albuterol (PROVENTIL HFA;VENTOLIN HFA) 108 (90 BASE) MCG/ACT inhaler Inhale 2 puffs into the lungs every 6 (six) hours as needed. For shortness of breath      . allopurinol (ZYLOPRIM) 100 MG tablet Take 50 mg by mouth every morning.      Marland Kitchen atorvastatin (LIPITOR) 40 MG tablet Take 40 mg by mouth daily.       . carvedilol (COREG) 3.125 MG tablet Take 3.125 mg by mouth 2 (two) times daily with a meal.      . cyanocobalamin 2000 MCG tablet Take 2,000 mcg by mouth daily.      Marland Kitchen docusate sodium (COLACE) 100 MG capsule Take 100 mg by mouth 2 (two) times daily as needed. For constipation      . glimepiride (AMARYL) 1 MG tablet Take 0.5 mg by mouth daily with breakfast.      . HYDROcodone-acetaminophen (NORCO/VICODIN) 5-325 MG per tablet Take 1-2 tablets by mouth every 6 (six) hours as needed for moderate pain (coughing or anxiety).      . hydrOXYzine (ATARAX/VISTARIL) 25 MG tablet Take 25 mg by mouth daily as needed for anxiety or itching (give only as needed).      Marland Kitchen linagliptin (TRADJENTA) 5 MG TABS tablet Take 1.25 mg by mouth daily.      Marland Kitchen LORazepam  (ATIVAN) 0.5 MG tablet Take 0.5 mg by mouth 2 (two) times daily as needed for anxiety.      . polyethylene glycol (MIRALAX / GLYCOLAX) packet Take 17 g by mouth daily as needed. For constipation      . sorbitol 70 % solution Take 60 mLs by mouth daily as needed (for constipation).       . VELPHORO 500 MG chewable tablet Chew 500 mg by mouth 3 (three) times daily with meals.      Marland Kitchen  zolpidem (AMBIEN) 5 MG tablet Take 5 mg by mouth at bedtime as needed (insomnia).       Labs: Basic Metabolic Panel:  Recent Labs Lab 12/15/2013 1001  NA 130*  K 5.1  CL 85*  CO2 25  GLUCOSE 230*  BUN 58*  CREATININE 5.11*  CALCIUM 8.9   Liver Function Tests:  Recent Labs Lab 12/19/2013 1001  AST 12  ALT 7  ALKPHOS 77  BILITOT 0.8  PROT 7.4  ALBUMIN 2.8*   No results found for this basename: LIPASE, AMYLASE,  in the last 168 hours No results found for this basename: AMMONIA,  in the last 168 hours CBC:  Recent Labs Lab 12/29/2013 1001  WBC 13.3*  NEUTROABS 12.2*  HGB 10.2*  HCT 31.2*  MCV 97.5  PLT 235   Cardiac Enzymes: No results found for this basename: CKTOTAL, CKMB, CKMBINDEX, TROPONINI,  in the last 168 hours CBG: No results found for this basename: GLUCAP,  in the last 168 hours Iron Studies: No results found for this basename: IRON, TIBC, TRANSFERRIN, FERRITIN,  in the last 72 hours Studies/Results: Ct Abdomen Pelvis W Contrast  12/21/2013   CLINICAL DATA:  Lower abdominal pain and tightness without nausea or vomiting; dialysis dependent renal failure, hysterectomy, diabetes, and CHF  EXAM: CT ABDOMEN AND PELVIS WITH CONTRAST  TECHNIQUE: Multidetector CT imaging of the abdomen and pelvis was performed using the standard protocol following bolus administration of intravenous contrast.  CONTRAST:  OMNIPAQUE IOHEXOL 300 MG/ML SOLN intravenously. The patient also received oral contrast material.  COMPARISON:  None.  FINDINGS: The mid and distal sigmoid colon exhibit abnormal wall  thickening with surrounding inflammatory changes. There is perisigmoid fluid. A tiny gas collection is demonstrated on image 51 of series 2. This is extraluminal. There is a moderate amount of stool in the sigmoid colon. More proximally the colonic stool burden is less. There is no small bowel obstruction.  The gallbladder is adequately distended and exhibits no calcified stones. The liver, adrenal glands, pancreas, and stomach are unremarkable. There are innumerable calcifications within the normal sized spleen. The abdominal aorta exhibits mural calcification but no aneurysm. The kidneys are atrophic. There is a cysts associated with the lower pole of the left kidney measuring 3.5 cm in diameter.  There are emphysematous changes at the lung bases. There are fibrotic changes versus atelectasis posteriorly in both lower lobes. The cardiac chambers are enlarged. There are degenerative changes of the lumbar spine and of both hips.  IMPRESSION: 1. Acute mid sigmoid diverticulitis with findings worrisome for at least micro perforation. No discrete drainable abscess is demonstrated. 2. There is no acute bowel abnormality elsewhere. 3. There is no acute hepatobiliary abnormality. 4. These results were called by telephone at the time of interpretation on 01/06/2014 at 12:49 pm to Dr. Wynetta Emery , who verbally acknowledged these results.   Electronically Signed   By: David  [place]   On: 12/23/2013 12:49    Review of Systems: Gen: Denies any fever, chills, sweats, anorexia, fatigue, weakness, malaise, weight loss, and sleep disorder HEENT: No visual complaints, No history of Retinopathy. Normal external appearance No Epistaxis or Sore throat. No sinusitis.   CV: Denies chest pain, angina, palpitations, syncope, orthopnea, PND, peripheral edema, and claudication. Resp: Denies dyspnea at rest, dyspnea with exercise, cough, sputum, wheezing, coughing up blood, and pleurisy. ZO:XWRUEAV 2 day history of lower  abdominal discomfort.  Denies vomiting blood, jaundice, and fecal incontinence.   Denies dysphagia or odynophagia.  GU : Denies urinary burning, blood in urine, urinary frequency, urinary hesitancy, nocturnal urination, and urinary incontinence.  No renal calculi. MS: Denies joint pain, limitation of movement, and swelling, stiffness, low back pain, extremity pain. Denies muscle weakness, cramps, atrophy.  No use of non steroidal antiinflammatory drugs. Derm: Denies rash, itching, dry skin, hives, moles, warts, or unhealing ulcers.  Psych: Denies depression, anxiety, memory loss, suicidal ideation, hallucinations, paranoia, and confusion. Heme: Denies bruising, bleeding, and enlarged lymph nodes. Neuro: No headache.  No diplopia. No dysarthria.  No dysphasia.  No history of CVA.  No Seizures. No paresthesias.  No weakness. Endocrine  No Thyroid disease.  No Adrenal disease.  Physical Exam: Filed Vitals:   12/24/2013 1130 12/20/2013 1300 01/04/2014 1315 12/16/2013 1330  BP: 112/63 107/56 111/60 114/57  Pulse: 102 88 99 86  Temp:      TempSrc:      Resp:   26 26  Height:      Weight:      SpO2: 82% 91% 94% 97%     General: Well developed, well nourished, in no acute distress. Head: Nores, rales, or rhonchi. Breathing is unlabmocephalic, atraumatic, sclera non-icteric, mucus membranes are moist Neck: Supple. JVD not elevated. No carotid bruits Lungs: Clear bilaterally to auscultation without wheezored. Heart: RRR with S1 S2. No murmurs, rubs, or gallops appreciated. Abdomen: Soft, LLQ tenderness, non-distended with normoactive bowel sounds. No rebound/guarding. No obvious abdominal masses. M-S:  Strength and tone appear normal for age. Lower extremities:without edema or ischemic changes, no open wounds  Neuro: Alert and oriented X 3. Moves all extremities spontaneously. Psych:  Responds to questions appropriately with a normal affect. Dialysis Access: R AVF +thrill  Dialysis Orders: MWF @  [facility] 66kgs 4hr  2k/2Ca 5000 Heparin  R AVF 350/1.5 Aranesp 40q week  Venofer  50 q week Profile 4  Assessment/Plan: 1.  Acute sigmoid diverticulitis- on CT. management per primary. Surgery consulted. Conservative treatment for now. WBC 13.3. Zosyn per pharm 2.  ESRD -  MWF @ [facility], HD pending today. K+5.1 3.  Hypertension/volume  - 116/64, home coreg 4.  Anemia  - hgb 10.2, cont Aranesp q Thursday. Hold Fe for now, last tsat 35 and ferritin 1025 (7/22) 5.  Metabolic bone disease -  Ca+ 8.9/9.9 corrected. velphoro when diet advanced.. Last phos 7/22 6.2/ pth 230 6.  Nutrition - NPO. Alb 2.8. Recent poor appetite.  7. DM- per primary  Jetty DuhamelBridget Whelan, NP [facility] Bee[facility] [phone] 12/19/2013, 2:09 PM   Pt seen, examined and agree w A/P as above.  Vinson Moselleob Luccia Reinheimer MD pager [phone]    cell [phone] 01/07/2014, 3:17 PM

## 2014-01-09 ENCOUNTER — Inpatient Hospital Stay (HOSPITAL_COMMUNITY): Payer: Medicare Other

## 2014-01-09 ENCOUNTER — Encounter (HOSPITAL_COMMUNITY): Payer: Self-pay | Admitting: Certified Registered"

## 2014-01-09 ENCOUNTER — Inpatient Hospital Stay (HOSPITAL_COMMUNITY): Payer: Medicare Other | Admitting: Certified Registered"

## 2014-01-09 ENCOUNTER — Encounter (HOSPITAL_COMMUNITY): Payer: Medicare Other | Admitting: Certified Registered"

## 2014-01-09 ENCOUNTER — Encounter (HOSPITAL_COMMUNITY): Admission: EM | Disposition: E | Payer: Self-pay | Source: Home / Self Care | Attending: Critical Care Medicine

## 2014-01-09 DIAGNOSIS — K5732 Diverticulitis of large intestine without perforation or abscess without bleeding: Secondary | ICD-10-CM

## 2014-01-09 DIAGNOSIS — K631 Perforation of intestine (nontraumatic): Secondary | ICD-10-CM

## 2014-01-09 DIAGNOSIS — J9600 Acute respiratory failure, unspecified whether with hypoxia or hypercapnia: Secondary | ICD-10-CM

## 2014-01-09 HISTORY — PX: LAPAROTOMY: SHX154

## 2014-01-09 HISTORY — PX: COLOSTOMY: SHX63

## 2014-01-09 LAB — COMPREHENSIVE METABOLIC PANEL
ALT: 12 U/L (ref 0–35)
AST: 24 U/L (ref 0–37)
Albumin: 2.1 g/dL — ABNORMAL LOW (ref 3.5–5.2)
Alkaline Phosphatase: 71 U/L (ref 39–117)
Anion gap: 24 — ABNORMAL HIGH (ref 5–15)
BILIRUBIN TOTAL: 0.6 mg/dL (ref 0.3–1.2)
BUN: 47 mg/dL — ABNORMAL HIGH (ref 6–23)
CALCIUM: 8.4 mg/dL (ref 8.4–10.5)
CHLORIDE: 94 meq/L — AB (ref 96–112)
CO2: 19 meq/L (ref 19–32)
Creatinine, Ser: 3.47 mg/dL — ABNORMAL HIGH (ref 0.50–1.10)
GFR calc Af Amer: 12 mL/min — ABNORMAL LOW (ref >90)
GFR calc non Af Amer: 10 mL/min — ABNORMAL LOW (ref >90)
GLUCOSE: 144 mg/dL — AB (ref 70–99)
Potassium: 4.6 mEq/L (ref 3.7–5.3)
SODIUM: 137 meq/L (ref 137–147)
Total Protein: 6.7 g/dL (ref 6.0–8.3)

## 2014-01-09 LAB — BLOOD GAS, ARTERIAL
Acid-base deficit: 2.5 mmol/L — ABNORMAL HIGH (ref 0.0–2.0)
Bicarbonate: 20.1 mEq/L (ref 20.0–24.0)
Drawn by: 39899
FIO2: 0.6 %
MECHVT: 570 mL
O2 SAT: 99.3 %
PEEP/CPAP: 5 cmH2O
PO2 ART: 234 mmHg — AB (ref 80.0–100.0)
Patient temperature: 98.6
RATE: 14 resp/min
TCO2: 20.8 mmol/L (ref 0–100)
pCO2 arterial: 25 mmHg — ABNORMAL LOW (ref 35.0–45.0)
pH, Arterial: 7.515 — ABNORMAL HIGH (ref 7.350–7.450)

## 2014-01-09 LAB — GLUCOSE, CAPILLARY
GLUCOSE-CAPILLARY: 128 mg/dL — AB (ref 70–99)
GLUCOSE-CAPILLARY: 130 mg/dL — AB (ref 70–99)
GLUCOSE-CAPILLARY: 135 mg/dL — AB (ref 70–99)
GLUCOSE-CAPILLARY: 164 mg/dL — AB (ref 70–99)
GLUCOSE-CAPILLARY: 168 mg/dL — AB (ref 70–99)
Glucose-Capillary: 133 mg/dL — ABNORMAL HIGH (ref 70–99)

## 2014-01-09 LAB — BASIC METABOLIC PANEL
Anion gap: 22 — ABNORMAL HIGH (ref 5–15)
BUN: 33 mg/dL — AB (ref 6–23)
CO2: 22 mEq/L (ref 19–32)
CREATININE: 3 mg/dL — AB (ref 0.50–1.10)
Calcium: 8.6 mg/dL (ref 8.4–10.5)
Chloride: 90 mEq/L — ABNORMAL LOW (ref 96–112)
GFR calc non Af Amer: 12 mL/min — ABNORMAL LOW (ref >90)
GFR, EST AFRICAN AMERICAN: 14 mL/min — AB (ref >90)
Glucose, Bld: 152 mg/dL — ABNORMAL HIGH (ref 70–99)
Potassium: 4.4 mEq/L (ref 3.7–5.3)
Sodium: 134 mEq/L — ABNORMAL LOW (ref 137–147)

## 2014-01-09 LAB — CBC
HCT: 32 % — ABNORMAL LOW (ref 36.0–46.0)
HCT: 32.9 % — ABNORMAL LOW (ref 36.0–46.0)
Hemoglobin: 10.2 g/dL — ABNORMAL LOW (ref 12.0–15.0)
Hemoglobin: 10.6 g/dL — ABNORMAL LOW (ref 12.0–15.0)
MCH: 31.5 pg (ref 26.0–34.0)
MCH: 31.5 pg (ref 26.0–34.0)
MCHC: 31.9 g/dL (ref 30.0–36.0)
MCHC: 32.2 g/dL (ref 30.0–36.0)
MCV: 98.8 fL (ref 78.0–100.0)
MCV: 97.9 fL (ref 78.0–100.0)
PLATELETS: 236 10*3/uL (ref 150–400)
Platelets: 259 10*3/uL (ref 150–400)
RBC: 3.24 MIL/uL — ABNORMAL LOW (ref 3.87–5.11)
RBC: 3.36 MIL/uL — AB (ref 3.87–5.11)
RDW: 14.3 % (ref 11.5–15.5)
RDW: 14.4 % (ref 11.5–15.5)
WBC: 15.2 10*3/uL — ABNORMAL HIGH (ref 4.0–10.5)
WBC: 6.4 10*3/uL (ref 4.0–10.5)

## 2014-01-09 LAB — POCT I-STAT 3, ART BLOOD GAS (G3+)
ACID-BASE DEFICIT: 3 mmol/L — AB (ref 0.0–2.0)
Bicarbonate: 21.4 mEq/L (ref 20.0–24.0)
O2 SAT: 97 %
PCO2 ART: 34.8 mmHg — AB (ref 35.0–45.0)
TCO2: 22 mmol/L (ref 0–100)
pH, Arterial: 7.394 (ref 7.350–7.450)
pO2, Arterial: 86 mmHg (ref 80.0–100.0)

## 2014-01-09 LAB — SURGICAL PCR SCREEN
MRSA, PCR: NEGATIVE
STAPHYLOCOCCUS AUREUS: POSITIVE — AB

## 2014-01-09 LAB — TROPONIN I
Troponin I: 3.32 ng/mL (ref <0.30)
Troponin I: 3.49 ng/mL (ref <0.30)

## 2014-01-09 LAB — LACTIC ACID, PLASMA: LACTIC ACID, VENOUS: 1.8 mmol/L (ref 0.5–2.2)

## 2014-01-09 SURGERY — LAPAROTOMY, EXPLORATORY
Anesthesia: General | Site: Abdomen

## 2014-01-09 MED ORDER — HYDROMORPHONE HCL PF 1 MG/ML IJ SOLN: 0.2500 mg | INTRAMUSCULAR | Status: DC | PRN

## 2014-01-09 MED ORDER — OXYCODONE HCL 5 MG PO TABS: 5.0000 mg | ORAL_TABLET | Freq: Once | ORAL | Status: DC | PRN

## 2014-01-09 MED ORDER — PROPOFOL 10 MG/ML IV BOLUS
INTRAVENOUS | Status: AC
  Filled 2014-01-09: qty 20

## 2014-01-09 MED ORDER — ONDANSETRON HCL 4 MG/2ML IJ SOLN: 4.0000 mg | Freq: Once | INTRAMUSCULAR | Status: DC | PRN

## 2014-01-09 MED ORDER — MUPIROCIN 2 % EX OINT
1.0000 "application " | TOPICAL_OINTMENT | Freq: Two times a day (BID) | CUTANEOUS | Status: DC
  Administered 2014-01-09 – 2014-01-10 (×3): 1 via NASAL
  Filled 2014-01-09: qty 22

## 2014-01-09 MED ORDER — HYDRALAZINE HCL 20 MG/ML IJ SOLN: 10.0000 mg | Freq: Four times a day (QID) | INTRAMUSCULAR | Status: DC | PRN

## 2014-01-09 MED ORDER — PHENYLEPHRINE HCL 10 MG/ML IJ SOLN
30.0000 ug/min | INTRAMUSCULAR | Status: DC
  Administered 2014-01-09: 20 ug/min via INTRAVENOUS
  Filled 2014-01-09 (×2): qty 1

## 2014-01-09 MED ORDER — DOPAMINE-DEXTROSE 3.2-5 MG/ML-% IV SOLN
2.0000 ug/kg/min | INTRAVENOUS | Status: DC
  Administered 2014-01-09: 3 ug/kg/min via INTRAVENOUS
  Administered 2014-01-09: 10 ug/kg/min via INTRAVENOUS
  Filled 2014-01-09 (×2): qty 250

## 2014-01-09 MED ORDER — FENTANYL CITRATE 0.05 MG/ML IJ SOLN
50.0000 ug | INTRAMUSCULAR | Status: DC | PRN
  Administered 2014-01-09 (×2): 25 ug via INTRAVENOUS
  Administered 2014-01-09 – 2014-01-10 (×3): 50 ug via INTRAVENOUS
  Filled 2014-01-09: qty 2

## 2014-01-09 MED ORDER — GLYCOPYRROLATE 0.2 MG/ML IJ SOLN
INTRAMUSCULAR | Status: AC
  Filled 2014-01-09: qty 2

## 2014-01-09 MED ORDER — SODIUM CHLORIDE 0.9 % IV SOLN
INTRAVENOUS | Status: DC | PRN
  Administered 2014-01-09: 12:00:00 via INTRAVENOUS

## 2014-01-09 MED ORDER — ASPIRIN 300 MG RE SUPP
300.0000 mg | Freq: Once | RECTAL | Status: AC
  Administered 2014-01-09: 300 mg via RECTAL
  Filled 2014-01-09: qty 1

## 2014-01-09 MED ORDER — LORAZEPAM BOLUS VIA INFUSION
0.5000 mg | Freq: Two times a day (BID) | INTRAVENOUS | Status: DC | PRN
  Filled 2014-01-09: qty 1

## 2014-01-09 MED ORDER — OXYCODONE HCL 5 MG/5ML PO SOLN
5.0000 mg | Freq: Once | ORAL | Status: DC | PRN
Start: 2014-01-09 — End: 2014-01-09

## 2014-01-09 MED ORDER — FLUCONAZOLE IN SODIUM CHLORIDE 200-0.9 MG/100ML-% IV SOLN
200.0000 mg | INTRAVENOUS | Status: DC
  Administered 2014-01-09 – 2014-01-10 (×2): 200 mg via INTRAVENOUS
  Filled 2014-01-09 (×3): qty 100

## 2014-01-09 MED ORDER — SODIUM CHLORIDE 0.9 % IV SOLN
Freq: Once | INTRAVENOUS | Status: AC
  Administered 2014-01-09: 250 mL via INTRAVENOUS

## 2014-01-09 MED ORDER — CETYLPYRIDINIUM CHLORIDE 0.05 % MT LIQD
7.0000 mL | Freq: Four times a day (QID) | OROMUCOSAL | Status: DC
  Administered 2014-01-09 – 2014-01-10 (×7): 7 mL via OROMUCOSAL

## 2014-01-09 MED ORDER — SODIUM CHLORIDE 0.9 % IV SOLN: INTRAVENOUS | Status: AC

## 2014-01-09 MED ORDER — ROCURONIUM BROMIDE 100 MG/10ML IV SOLN
INTRAVENOUS | Status: DC | PRN
  Administered 2014-01-09: 20 mg via INTRAVENOUS
  Administered 2014-01-09: 30 mg via INTRAVENOUS

## 2014-01-09 MED ORDER — FENTANYL CITRATE 0.05 MG/ML IJ SOLN
INTRAMUSCULAR | Status: DC | PRN
  Administered 2014-01-09 (×2): 50 ug via INTRAVENOUS

## 2014-01-09 MED ORDER — LIDOCAINE HCL (CARDIAC) 20 MG/ML IV SOLN
INTRAVENOUS | Status: DC | PRN
  Administered 2014-01-09: 8050 mg via INTRAVENOUS

## 2014-01-09 MED ORDER — FENTANYL CITRATE 0.05 MG/ML IJ SOLN
INTRAMUSCULAR | Status: AC
  Filled 2014-01-09: qty 5

## 2014-01-09 MED ORDER — FENTANYL CITRATE 0.05 MG/ML IJ SOLN: 50.0000 ug | INTRAMUSCULAR | Status: DC | PRN

## 2014-01-09 MED ORDER — MIDAZOLAM HCL 2 MG/2ML IJ SOLN
INTRAMUSCULAR | Status: AC
  Administered 2014-01-09: 1 mg via INTRAVENOUS
  Filled 2014-01-09: qty 2

## 2014-01-09 MED ORDER — ONDANSETRON HCL 4 MG/2ML IJ SOLN
INTRAMUSCULAR | Status: AC
  Filled 2014-01-09: qty 2

## 2014-01-09 MED ORDER — PANTOPRAZOLE SODIUM 40 MG IV SOLR
40.0000 mg | Freq: Every day | INTRAVENOUS | Status: DC
  Administered 2014-01-09 – 2014-01-11 (×3): 40 mg via INTRAVENOUS
  Filled 2014-01-09 (×4): qty 40

## 2014-01-09 MED ORDER — NEOSTIGMINE METHYLSULFATE 10 MG/10ML IV SOLN
INTRAVENOUS | Status: AC
  Filled 2014-01-09: qty 1

## 2014-01-09 MED ORDER — SUCCINYLCHOLINE CHLORIDE 20 MG/ML IJ SOLN
INTRAMUSCULAR | Status: DC | PRN
  Administered 2014-01-09: 100 mg via INTRAVENOUS

## 2014-01-09 MED ORDER — MIDAZOLAM HCL 2 MG/2ML IJ SOLN
1.0000 mg | Freq: Once | INTRAMUSCULAR | Status: AC
  Administered 2014-01-09: 1 mg via INTRAVENOUS

## 2014-01-09 MED ORDER — SODIUM CHLORIDE 0.9 % IV SOLN
25.0000 ug/h | INTRAVENOUS | Status: DC
  Administered 2014-01-09: 100 ug/h via INTRAVENOUS
  Administered 2014-01-09: 50 ug/h via INTRAVENOUS
  Administered 2014-01-10: 100 ug/h via INTRAVENOUS
  Filled 2014-01-09 (×3): qty 50

## 2014-01-09 MED ORDER — ONDANSETRON HCL 4 MG/2ML IJ SOLN
INTRAMUSCULAR | Status: DC | PRN
  Administered 2014-01-09: 4 mg via INTRAVENOUS

## 2014-01-09 MED ORDER — MORPHINE SULFATE 2 MG/ML IJ SOLN
1.0000 mg | Freq: Once | INTRAMUSCULAR | Status: AC
Start: 2014-01-09 — End: 2014-01-09
  Administered 2014-01-09: 1 mg via INTRAVENOUS

## 2014-01-09 MED ORDER — PHENYLEPHRINE HCL 10 MG/ML IJ SOLN
INTRAMUSCULAR | Status: DC | PRN
  Administered 2014-01-09: 120 ug via INTRAVENOUS
  Administered 2014-01-09: 40 ug via INTRAVENOUS
  Administered 2014-01-09 (×3): 80 ug via INTRAVENOUS

## 2014-01-09 MED ORDER — MORPHINE SULFATE 2 MG/ML IJ SOLN
2.0000 mg | INTRAMUSCULAR | Status: DC | PRN
  Administered 2014-01-09: 2 mg via INTRAVENOUS
  Filled 2014-01-09 (×2): qty 1

## 2014-01-09 MED ORDER — CHLORHEXIDINE GLUCONATE 0.12 % MT SOLN
15.0000 mL | Freq: Two times a day (BID) | OROMUCOSAL | Status: DC
  Administered 2014-01-09 – 2014-01-11 (×4): 15 mL via OROMUCOSAL
  Filled 2014-01-09 (×4): qty 15

## 2014-01-09 MED ORDER — PHENYLEPHRINE 40 MCG/ML (10ML) SYRINGE FOR IV PUSH (FOR BLOOD PRESSURE SUPPORT)
PREFILLED_SYRINGE | INTRAVENOUS | Status: AC
  Filled 2014-01-09: qty 10

## 2014-01-09 MED ORDER — PHENYLEPHRINE HCL 10 MG/ML IJ SOLN
10.0000 mg | INTRAVENOUS | Status: DC | PRN
  Administered 2014-01-09: 80 ug/min via INTRAVENOUS

## 2014-01-09 MED ORDER — CHLORHEXIDINE GLUCONATE CLOTH 2 % EX PADS
6.0000 | MEDICATED_PAD | Freq: Every day | CUTANEOUS | Status: DC
  Administered 2014-01-10 – 2014-01-11 (×2): 6 via TOPICAL

## 2014-01-09 MED ORDER — SODIUM CHLORIDE 0.9 % IV BOLUS (SEPSIS)
250.0000 mL | Freq: Once | INTRAVENOUS | Status: AC
  Administered 2014-01-09: 250 mL via INTRAVENOUS

## 2014-01-09 SURGICAL SUPPLY — 50 items
BLADE SURG ROTATE 9660 (MISCELLANEOUS) IMPLANT
BNDG GAUZE ELAST 4 BULKY (GAUZE/BANDAGES/DRESSINGS) ×4 IMPLANT
CANISTER SUCTION 2500CC (MISCELLANEOUS) ×4 IMPLANT
COVER MAYO STAND STRL (DRAPES) IMPLANT
COVER SURGICAL LIGHT HANDLE (MISCELLANEOUS) ×4 IMPLANT
DRAPE LAPAROSCOPIC ABDOMINAL (DRAPES) ×4 IMPLANT
DRAPE PROXIMA HALF (DRAPES) IMPLANT
DRAPE UTILITY 15X26 W/TAPE STR (DRAPE) ×8 IMPLANT
DRAPE WARM FLUID 44X44 (DRAPE) ×4 IMPLANT
DRSG OPSITE POSTOP 4X10 (GAUZE/BANDAGES/DRESSINGS) IMPLANT
DRSG OPSITE POSTOP 4X8 (GAUZE/BANDAGES/DRESSINGS) IMPLANT
ELECT BLADE 6.5 EXT (BLADE) IMPLANT
ELECT CAUTERY BLADE 6.4 (BLADE) ×4 IMPLANT
ELECT REM PT RETURN 9FT ADLT (ELECTROSURGICAL) ×4
ELECTRODE REM PT RTRN 9FT ADLT (ELECTROSURGICAL) ×2 IMPLANT
GLOVE SURG SIGNA 7.5 PF LTX (GLOVE) ×8 IMPLANT
GOWN STRL REUS W/ TWL LRG LVL3 (GOWN DISPOSABLE) ×4 IMPLANT
GOWN STRL REUS W/ TWL XL LVL3 (GOWN DISPOSABLE) ×4 IMPLANT
GOWN STRL REUS W/TWL LRG LVL3 (GOWN DISPOSABLE) ×4
GOWN STRL REUS W/TWL XL LVL3 (GOWN DISPOSABLE) ×4
KIT BASIN OR (CUSTOM PROCEDURE TRAY) ×4 IMPLANT
KIT OSTOMY DRAINABLE 2.75 STR (WOUND CARE) ×4 IMPLANT
KIT ROOM TURNOVER OR (KITS) ×4 IMPLANT
LIGASURE IMPACT 36 18CM CVD LR (INSTRUMENTS) IMPLANT
NS IRRIG 1000ML POUR BTL (IV SOLUTION) ×8 IMPLANT
PACK GENERAL/GYN (CUSTOM PROCEDURE TRAY) ×4 IMPLANT
PAD ABD 8X10 STRL (GAUZE/BANDAGES/DRESSINGS) ×8 IMPLANT
PAD ARMBOARD 7.5X6 YLW CONV (MISCELLANEOUS) ×4 IMPLANT
PENCIL BUTTON HOLSTER BLD 10FT (ELECTRODE) IMPLANT
SPECIMEN JAR LARGE (MISCELLANEOUS) IMPLANT
SPONGE GAUZE 4X4 12PLY STER LF (GAUZE/BANDAGES/DRESSINGS) ×4 IMPLANT
SPONGE LAP 18X18 X RAY DECT (DISPOSABLE) IMPLANT
STAPLER CUT CVD 40MM BLUE (STAPLE) ×4 IMPLANT
STAPLER PROXIMATE 75MM BLUE (STAPLE) ×4 IMPLANT
STAPLER VISISTAT 35W (STAPLE) ×4 IMPLANT
SUCTION POOLE TIP (SUCTIONS) ×4 IMPLANT
SUT NOVA NAB DX-16 0-1 5-0 T12 (SUTURE) ×4 IMPLANT
SUT PDS AB 1 TP1 96 (SUTURE) ×8 IMPLANT
SUT SILK 2 0 SH CR/8 (SUTURE) ×4 IMPLANT
SUT SILK 2 0 TIES 10X30 (SUTURE) ×4 IMPLANT
SUT SILK 3 0 SH CR/8 (SUTURE) ×4 IMPLANT
SUT SILK 3 0 TIES 10X30 (SUTURE) ×4 IMPLANT
SUT VIC AB 3-0 SH 18 (SUTURE) ×4 IMPLANT
TAPE CLOTH SURG 4X10 WHT LF (GAUZE/BANDAGES/DRESSINGS) ×4 IMPLANT
TOWEL OR 17X24 6PK STRL BLUE (TOWEL DISPOSABLE) ×4 IMPLANT
TOWEL OR 17X26 10 PK STRL BLUE (TOWEL DISPOSABLE) ×4 IMPLANT
TRAY FOLEY CATH 16FRSI W/METER (SET/KITS/TRAYS/PACK) IMPLANT
TUBE CONNECTING 12'X1/4 (SUCTIONS)
TUBE CONNECTING 12X1/4 (SUCTIONS) IMPLANT
YANKAUER SUCT BULB TIP NO VENT (SUCTIONS) IMPLANT

## 2014-01-09 NOTE — Progress Notes (Signed)
[facility] Progress Note     Subjective: Pt feels minimally better today, WBC up.  No N/V.  Having a little gas, but no BM.  Sitting up in chair.    Objective: Vital signs in last 24 hours: Temp:  [97.7 F (36.5 C)-98.4 F (36.9 C)] 98.1 F (36.7 C) (08/01 0453) Pulse Rate:  [85-107] 88 (08/01 0453) Resp:  [16-32] 18 (08/01 0453) BP: (91-121)/(53-70) 108/68 mmHg (08/01 0453) SpO2:  [82 %-98 %] 98 % (07/31 1424) Weight:  [146 lb 9.7 oz (66.5 kg)-150 lb (68.04 kg)] 146 lb 9.7 oz (66.5 kg) (07/31 1922) Last BM Date: 01/04/14  Intake/Output from previous day: 07/31 0701 - 08/01 0700 In: 50 [IV Piggyback:50] Out: 1354  Intake/Output this shift:    PE: Gen:  Alert, NAD, pleasant Abd: Soft, mild distension, tender in LLQ and suprapubic, +BS, no HSM, no abdominal scars noted   Lab Results:   Recent Labs  12/14/2013 1001 Sep 24, 2013 0555  WBC 13.3* 15.2*  HGB 10.2* 10.2*  HCT 31.2* 32.0*  PLT 235 259   BMET  Recent Labs  01/03/2014 1001 Sep 24, 2013 0555  NA 130* 134*  K 5.1 4.4  CL 85* 90*  CO2 25 22  GLUCOSE 230* 152*  BUN 58* 33*  CREATININE 5.11* 3.00*  CALCIUM 8.9 8.6   PT/INR No results found for this basename: LABPROT, INR,  in the last 72 hours CMP     Component Value Date/Time   NA 134* 01/10/2014 0555   K 4.4 01/15/2014 0555   CL 90* 02/06/2014 0555   CO2 22 02/05/2014 0555   GLUCOSE 152* 02/02/2014 0555   BUN 33* 02/01/2014 0555   CREATININE 3.00* 02/04/2014 0555   CALCIUM 8.6 01/16/2014 0555   PROT 7.4 12/23/2013 1001   ALBUMIN 2.8* 12/31/2013 1001   AST 12 12/10/2013 1001   ALT 7 12/14/2013 1001   ALKPHOS 77 12/18/2013 1001   BILITOT 0.8 12/22/2013 1001   GFRNONAA 12* 01/10/2014 0555   GFRAA 14* 01/24/2014 0555   Lipase  No results found for this basename: lipase       Studies/Results: Ct Abdomen Pelvis W Contrast  12/12/2013   CLINICAL DATA:  Lower abdominal pain and tightness without nausea or vomiting; dialysis dependent renal failure,  hysterectomy, diabetes, and CHF  EXAM: CT ABDOMEN AND PELVIS WITH CONTRAST  TECHNIQUE: Multidetector CT imaging of the abdomen and pelvis was performed using the standard protocol following bolus administration of intravenous contrast.  CONTRAST:  100mL OMNIPAQUE IOHEXOL 300 MG/ML SOLN intravenously. The patient also received oral contrast material.  COMPARISON:  None.  FINDINGS: The mid and distal sigmoid colon exhibit abnormal wall thickening with surrounding inflammatory changes. There is perisigmoid fluid. A tiny gas collection is demonstrated on image 51 of series 2. This is extraluminal. There is a moderate amount of stool in the sigmoid colon. More proximally the colonic stool burden is less. There is no small bowel obstruction.  The gallbladder is adequately distended and exhibits no calcified stones. The liver, adrenal glands, pancreas, and stomach are unremarkable. There are innumerable calcifications within the normal sized spleen. The abdominal aorta exhibits mural calcification but no aneurysm. The kidneys are atrophic. There is a cysts associated with the lower pole of the left kidney measuring 3.5 cm in diameter.  There are emphysematous changes at the lung bases. There are fibrotic changes versus atelectasis posteriorly in both lower lobes. The cardiac chambers are enlarged. There are degenerative changes of the lumbar spine and  of both hips.  IMPRESSION: 1. Acute mid sigmoid diverticulitis with findings worrisome for at least micro perforation. No discrete drainable abscess is demonstrated. 2. There is no acute bowel abnormality elsewhere. 3. There is no acute hepatobiliary abnormality. 4. These results were called by telephone at the time of interpretation on Feb 05, 2014 at 12:49 pm to Dr. Wynetta Emery , who verbally acknowledged these results.   Electronically Signed   By: David  [place]   On: Feb 05, 2014 12:49    Anti-infectives: Anti-infectives   Start     Dose/Rate Route Frequency Ordered  Stop   02/05/14 1330  piperacillin-tazobactam (ZOSYN) IVPB 2.25 g     2.25 g 100 mL/hr over 30 Minutes Intravenous 3 times per day 02-05-2014 1303         Assessment/Plan Acute sigmoid diverticulitis with microperforation without abscess  Leukocytosis - up to 15.2 ESRD on dialysis - Cr. 3.0  Plan:  1. NPO, bowel rest, IVF, pain control, antiemetics, antibiotics (Zosyn Day #2)  2. Concerned about WBC going up, but continue to treat conservatively for now. If not improving over the next few days may need to discuss whether she is medically able to tolerate a large surgery.  She and her niece wish to proceed with surgery if it came to th at point. 3. DVT proph okay, SCD's  4. Ambulate and IS      LOS: 1 day    Kimberly Norman, Kimberly Norman 02/02/2014, 7:45 AM Pager: [phone]

## 2014-01-09 NOTE — Procedures (Signed)
Central Venous Catheter Insertion Procedure Note  Procedure: Insertion of Central Venous Catheter  Indications:  vascular access, need for frequent blood draws, medication delivery  Procedure Details  Informed consent was obtained for the procedure, including sedation.  Risks of lung perforation, hemorrhage, arrhythmia, and adverse drug reaction were discussed.   Maximum sterile technique was used including antiseptics, cap, gloves, gown, hand hygiene, mask and sheet.  Under sterile conditions the skin above the on the left internal jugular vein was prepped with chlorhexidine and covered with a sterile drape. Local anesthesia was applied to the skin and subcutaneous tissues. The vein was visualized by ultrasound and an 18-gauge needle was then inserted into the vein under ultrasound guidance. A guide wire was then passed easily through the catheter. There were no arrhythmias, three PVCs noted. The catheter was then withdrawn. A 7.0 French triple-lumen was then inserted into the vessel over the guide wire. The catheter was sutured into place.  Findings: There were no changes to vital signs. Catheter was flushed with 10 cc NS. Patient did tolerate procedure well.  Recommendations: CXR ordered to verify placement.

## 2014-01-09 NOTE — Progress Notes (Signed)
eLink Physician-Brief Progress Note Patient Name: Kimberly Norman DOB: Feb 18, 1919 MRN: [id number]  Date of Service  01/17/2014   HPI/Events of Note   Shock, NSTEMI, 80 y/o post op from major surgery on vent  eICU Interventions  Discussed code status with niece> limited code blue, CPR or shocks but everything else OK Check CVP, ABG, CMET now   Intervention Category Major Interventions: Shock - evaluation and management;End of life / care limitation discussion  Calissa Swenor 01/20/2014, 10:31 PM

## 2014-01-09 NOTE — Progress Notes (Signed)
Paged Dr. Thedore MinsSingh for patient's pain still rated 9/10 sharp pain in RLQ of abdomen after receiving 1mg  of Morphine IV. Ordered an additional dose of 1mg  morphine and told to page general surgery due to sharp pain starting after they pressed on her belly this AM. Paged general surgery and said to continue to monitor and let them know if pain is not any better after second dose of morphine.

## 2014-01-09 NOTE — Progress Notes (Signed)
eLink Physician-Brief Progress Note Patient Name: Kimberly Norman DOB: 07-24-1918 MRN: [id number]  Date of Service  02/05/2014   HPI/Events of Note   For NSTEMI > presumably due to stress of surgery, will start ASA suppository, discuss heparin with cardiology  eICU Interventions     Intervention Category Intermediate Interventions: Diagnostic test evaluation  MCQUAID, DOUGLAS 01/10/2014, 6:38 PM

## 2014-01-09 NOTE — Anesthesia Procedure Notes (Signed)
Procedure Name: Intubation Date/Time: 01/28/2014 12:11 PM Performed by: Charm BargesBUTLER, Teiana Hajduk R Pre-anesthesia Checklist: Patient identified, Emergency Drugs available, Suction available, Patient being monitored and Timeout performed Patient Re-evaluated:Patient Re-evaluated prior to inductionOxygen Delivery Method: Circle system utilized Preoxygenation: Pre-oxygenation with 100% oxygen Intubation Type: IV induction, Rapid sequence and Cricoid Pressure applied Laryngoscope Size: Mac and 3 Grade View: Grade I Tube type: Oral Tube size: 7.5 mm Number of attempts: 1 Airway Equipment and Method: Stylet Placement Confirmation: ETT inserted through vocal cords under direct vision,  positive ETCO2 and breath sounds checked- equal and bilateral Secured at: 21 cm Tube secured with: Tape Dental Injury: Teeth and Oropharynx as per pre-operative assessment

## 2014-01-09 NOTE — Progress Notes (Signed)
ANTIBIOTIC CONSULT NOTE   Pharmacy Consult for Zosyn, fluconazole Indication: diverticulitis , perotinitis   No Known Allergies  Patient Measurements: Height: 5\' 5"  (165.1 cm) Weight: 146 lb 9.7 oz (66.5 kg) IBW/kg (Calculated) : 57  Vital Signs: Temp: 98.3 F (36.8 C) (08/01 1129) Temp src: Oral (08/01 1129) BP: 105/64 mmHg (08/01 1129) Pulse Rate: 94 (08/01 1400) Intake/Output from previous day: 07/31 0701 - 08/01 0700 In: 50 [IV Piggyback:50] Out: 1354  Intake/Output from this shift: Total I/O In: 1000 [I.V.:1000] Out: -   Labs:  Recent Labs  08-12-2013 1001 01/13/2014 0555  WBC 13.3* 15.2*  HGB 10.2* 10.2*  PLT 235 259  CREATININE 5.11* 3.00*   Estimated Creatinine Clearance: 10.5 ml/min (by C-G formula based on Cr of 3). No results found for this basename: VANCOTROUGH, VANCOPEAK, VANCORANDOM, GENTTROUGH, GENTPEAK, GENTRANDOM, TOBRATROUGH, TOBRAPEAK, TOBRARND, AMIKACINPEAK, AMIKACINTROU, AMIKACIN,  in the last 72 hours   Microbiology: No results found for this or any previous visit (from the past 720 hour(s)).  Medical History: Past Medical History  Diagnosis Date  . Hyperlipidemia   . Hypertension   . Diabetes mellitus Age 80    using insulin  . Arthritis   . Osteoporosis   . Cataract   . Constipation 10/26/10  . Gout   . Anemia     Receives iron infusions per nephrology  . CHF (congestive heart failure)   . On home oxygen therapy     2 l  . Anxiety     situational- surgery  . Chronic kidney disease     ESRD. Kidney dz since ~2008. s/p fistula placement.. Low office weight 143.   Marland Kitchen. Renal artery stenosis     Angiogram 2006 shoed L RAS  60% and 30% stenosis in R renal artery.    . Ectopic atrial rhythm     Medications:  See electronic med rec  Assessment: 80 y.o. female presents with abd pain. Started Zosyn for diverticulitis 7/31. Pt with ESRD (o/p HD M/W/F).  Patient now s/p sigmoid colectomy with end-colostomy this afternoon. New orders to  start empiric fluconazole for perotinitis. No fevers noted, wbc is trending up to 15.2. Recommended dose is 200mg  daily, if higher doses are needed will change dosing to after each HD session, for now continue with daily dosing.  Goal of Therapy:  Resolution of infection  Plan:  1. Zosyn 2.25gm IV q8h. 2. Will f/u micro data, pt's clinical condition 3. Fluconazole 200mg  q24 hours  Sheppard CoilFrank Jhayla Podgorski PharmD., BCPS Clinical Pharmacist Pager [phone] 01/13/2014 3:12 PM

## 2014-01-09 NOTE — Progress Notes (Signed)
RN called RT, patient sats in 4440s.  RN had already bagged patient back up upon arrival.  Patient began to desat again.  Lavaged and suctioned white, thick secretions.  Performed recruitment with sat increased to 100%.  RT will continue to monitor.

## 2014-01-09 NOTE — Op Note (Signed)
NAMOrson Norman:  Norman, Kimberly                 ACCOUNT NO.:  [id number]  MEDICAL RECORD NO.:  [id number]  LOCATION:  2S15C                        FACILITY:  [facility]  PHYSICIAN:  Abigail Miyamotoouglas Yusra Ravert, M.D. DATE OF BIRTH:  [birth year]  DATE OF PROCEDURE:  02/06/2014 DATE OF DISCHARGE:                              OPERATIVE REPORT   PREOPERATIVE DIAGNOSIS:  Perforated sigmoid diverticulitis.  POSTOPERATIVE DIAGNOSIS:  Perforated sigmoid diverticulitis.  PROCEDURE:  Sigmoid colectomy with end-colostomy (Hartmann's procedure).  SURGEON:  Abigail Miyamotoouglas Marcas Bowsher, M.D.  ASSISTANT:  Thornton ParkMatthew B. Daphine DeutscherMartin, MD  ANESTHESIA:  General endotracheal anesthesia.  ESTIMATED BLOOD LOSS:  Minimal.  INDICATIONS:  This is a 80 year old female who presented with diverticulitis with focal perforation.  She acutely worsened over the past 24 hours, decision was made to proceed to the operating room for emergent exploration.  FINDINGS:  The patient was found to have a large hole in the sigmoid colon with gross stool leaking into the peritoneal cavity.  PROCEDURE IN DETAIL:  The patient was brought to the operating room and identified as Kimberly Norman.  She was placed supine on the operating table and general anesthesia was induced.  Her abdomen was then prepped and draped in usual sterile fashion.  Using a #10 blade, a midline incision was then created.  This was taken down through the subcutaneous tissue with electrocautery.  The peritoneum was identified and opened the entire length of the incision.  Upon entering the abdomen, the patient was found to have gross stool and turbid fluid in the pelvis from obvious perforated sigmoid diverticulitis.  I cultured the fluid.  I then identified the perforation of sigmoid colon.  I was able to transect the colon proximal to this with a GIA-75 stapler.  I then took down the mesentery working toward the pelvis.  I then transected the rectum distal to the perforation with a Contour  stapler.  I then placed a 3-0 Prolene suture at the staple line of the rectum.  I then took down the mesentery with the ligature stapling device.  The specimen was then removed and sent to Pathology for evaluation.  I then thoroughly irrigated the abdomen with several liters of normal saline.  I made a separate incision in the right lower quadrant and placed a 19-French drain Blake drain into the pelvis.  This was sewn in with a nylon suture.  I then made an elliptical incision in the patient's left abdomen.  I mobilized the descending colon along the white line of Toldt.  I then made the elliptical incision for the ostomy in the left abdomen and took this down through the subcutaneous tissue with electrocautery.  The fascia was then identified and opened in a cruciate manner.  The underlying abdominal muscles were then dissected free and the peritoneum was opened.  I then pulled out the descending colon through this incision as the end colostomy.  I then closed the patient's midline fascia with a running looped #1 PDS suture as well as interrupted internal retention #1 Novafil sutures.  I left the skin open and packed with wet-to-dry gauze.  I then opened the staple line of the ostomy, matured it circumferentially with interrupted 3-0  Vicryl sutures.  Dry gauze and tape were then applied over the midline incision and ostomy appliance was placed to the ostomy.  The ostomy appeared pink and well perfused.  The patient tolerated the procedure well.  All the counts were correct at the end of procedure.  The patient was then left intubated and taken in a guarded condition to the intensive care unit.     Abigail Miyamoto, M.D.     DB/MEDQ  D:  01/26/2014  T:  02/01/2014  Job:  [id number]

## 2014-01-09 NOTE — Progress Notes (Signed)
CRITICAL VALUE ALERT  Critical value received:  Troponin 3.32  Date of notification:  02-27-14  Time of notification:  1726  Critical value read back yes Nurse who received alert:  Catalina LungerNicole Cristin Penaflor, RN  MD notified (1st page):  Dr. Vira AgarYusef - cardiology  Time of first page:  1745  MD notified (2nd page):  Time of second page:  Responding MD:  Dr. Vira AgarYusef  Time MD responded:  1750  Aspirin suppository admin.  Serial troponins ordered.  EKG had been performed earlier.

## 2014-01-09 NOTE — Progress Notes (Signed)
Pt was admitted to SICU at 1400 postop sigmoid colectomy with end-colostomy. Shortly after arrival, pt did have an episode of desaturation - quickly to the 40s with a good pulse oximetry waveform.  Pt was lavaged and recruited and had no further episodes.  Pt was placed on 100% support for a short while to allow her to recover.  Pain was to be managed with IVP of morphine and fentanyl, however, with the pt remaining intubated overnight, the pt was not tolerating this and a continuous fentanyl drip was ordered by Dr. Kendrick FriesMcQuaid. At 1600 the pt started to have sys pressures in the 70-80s.  Dr. Vassie LollAlva ordered 250ml NS boluses were to be admin. For a MAP of 65 or sys of 100.  4 boluses were admin. For a total of 1 liter.  Dr. Kendrick FriesMcQuaid was advised that the pt had received 1L and he ordered neo-synephrine for a MAP of 65.  IV Team was contacted to obtain another site since the pt only had 1 PIV and now has neo., fentanyl and antibiotics ordered.  Another IV was obtained.  Critical troponin was called to Cardiology.  Troponin was 3.32.  Serial troponins were ordered and a asprin suppository.  Cardiology wanted the neo-synephrine changed to a dopamine drip.  Dr. Kendrick FriesMcQuaid was advised that the dopamine was ordered and would be infusing peripheral.  Dr. Kendrick FriesMcQuaid advised a central line will be placed.

## 2014-01-09 NOTE — Progress Notes (Signed)
Patient ID: Kimberly Norman, female   DOB: [birth year], 80 y.o.   MRN: [id number]  Unfortunately, her abdominal pain has acutely worsened. She now has diffuse peritonitis on physical examination with diffuse tenderness and guarding. I suspect she has a full perforation. I discussed this with the patient and her daughters. I recommend emergent exploratory laparotomy with probable resection and colostomy. I explained this to them in detail. I explained the risk of surgery which includes but is not limited to bleeding, ongoing infection, injury to surrounding structures, need for further surgery, prolonged ventilation, DVT formation, cardiopulmonary problems, and death. She understands and wishes to proceed emergently.

## 2014-01-09 NOTE — Progress Notes (Signed)
eLink Physician-Brief Progress Note Patient Name: Kimberly Norman DOB: 1918-12-25 MRN: [id number]  Date of Service  01/13/2014   HPI/Events of Note   Mild hypotension, has received 3.5 L ESRD Positive troponin EKG with ST depression  eICU Interventions  Asked RN to [facility]ology Will discuss anticoagulation with gen surg Neosynephrine, avoid further fluid   Intervention Category Major Interventions: Shock - evaluation and management  MCQUAID, DOUGLAS 01/15/2014, 6:02 PM

## 2014-01-09 NOTE — Anesthesia Preprocedure Evaluation (Addendum)
Anesthesia Evaluation  Patient identified by MRN, date of birth, ID band Patient awake    Reviewed: Allergy & Precautions, H&P , NPO status , Patient's Chart, lab work & pertinent test results  Airway Mallampati: II TM Distance: >3 FB Neck ROM: Full    Dental  (+) Teeth Intact, Dental Advisory Given   Pulmonary former smoker,  breath sounds clear to auscultation        Cardiovascular hypertension, Pt. on medications and Pt. on home beta blockers + Peripheral Vascular Disease and +CHF Rhythm:Regular Rate:Normal     Neuro/Psych    GI/Hepatic   Endo/Other  diabetes, Well Controlled, Type 2  Renal/GU Dialysis and ESRFRenal disease     Musculoskeletal   Abdominal   Peds  Hematology   Anesthesia Other Findings   Reproductive/Obstetrics                         Anesthesia Physical Anesthesia Plan  ASA: IV and emergent  Anesthesia Plan: General   Post-op Pain Management:    Induction: Intravenous, Rapid sequence and Cricoid pressure planned  Airway Management Planned: Oral ETT  Additional Equipment: None  Intra-op Plan:   Post-operative Plan: Possible Post-op intubation/ventilation and Extubation in OR  Informed Consent: I have reviewed the patients History and Physical, chart, labs and discussed the procedure including the risks, benefits and alternatives for the proposed anesthesia with the patient or authorized representative who has indicated his/her understanding and acceptance.   Dental advisory given  Plan Discussed with: CRNA, Anesthesiologist and Surgeon  Anesthesia Plan Comments:         Anesthesia Quick Evaluation

## 2014-01-09 NOTE — Anesthesia Postprocedure Evaluation (Signed)
  Anesthesia Post-op Note  Patient: Kimberly Norman  Procedure(s) Performed: Procedure(s): Sigmiod colon resection (N/A) COLOSTOMY (Left)  Patient Location: PACU  Anesthesia Type: General   Level of Consciousness: awake, alert  and oriented  Airway and Oxygen Therapy: Patient Spontanous Breathing  Post-op Pain: mild  Post-op Assessment: Post-op Vital signs reviewed  Post-op Vital Signs: Reviewed  Last Vitals:  Filed Vitals:   01/20/2014 2330  BP: 91/43  Pulse: 113  Temp:   Resp: 14    Complications: No apparent anesthesia complications

## 2014-01-09 NOTE — Plan of Care (Signed)
Problem: Phase I Progression Outcomes Goal: Hemodynamically stable Outcome: Progressing Patient continues to have lower abdominal pain.  Relief achieved with PO hydrocodone.  No acute events occurred this shift.  Remains NPO, will continue to monitor patient condition.

## 2014-01-09 NOTE — Op Note (Signed)
Sigmiod colon resection  Procedure Note  Kimberly Norman 12/28/2013 - 02/05/2014   Pre-op Diagnosis: perforated diverticulum     Post-op Diagnosis: same  Procedure(s): Sigmoid colectomy with end colostomy (Hartman's procedure)  Surgeon(s): Shelly Rubensteinouglas A Javiana Anwar, MD  Anesthesia: General  Staff:  Circulator: Arelia SneddonAmy Louise Troy, RN; Simonne MaffucciAnita G Mason, RN Relief Scrub: Vassie MoselleShirl A Satterfield, RN Scrub Person: Rudean HaskellJennifer K Schachtner  Estimated Blood Loss: Minimal               Specimens: sent to path          [facility] LPBLACKMAN,[name]   Date: 01/16/2014  Time: 1:26 PM

## 2014-01-09 NOTE — H&P (Signed)
CRITICAL VALUE ALERT  Critical value received:troponin 3.33  Date of notification:  01/24/2014  Time of notification: 1903  Critical value read back:Yes.    Nurse who received alert:  Orene DesanctisNicole Z  MD notified (1st page): Yousuf  Time of first page:  1904    Responding MD:  Don BroachYousuf  Time MD responded:  1904

## 2014-01-09 NOTE — Transfer of Care (Signed)
Immediate Anesthesia Transfer of Care Note  Patient: Kimberly Norman  Procedure(s) Performed: Procedure(s): Sigmiod colon resection (N/A) COLOSTOMY (Left)  Patient Location: ICU  Anesthesia Type:General  Level of Consciousness: unresponsive and Patient remains intubated per anesthesia plan  Airway & Oxygen Therapy: Patient remains intubated per anesthesia plan and Patient placed on Ventilator (see vital sign flow sheet for setting)  Post-op Assessment: Report given to PACU RN and Post -op Vital signs reviewed and stable  Post vital signs: Reviewed and stable  Complications: No apparent anesthesia complications

## 2014-01-09 NOTE — Progress Notes (Signed)
  Cardiology Cross cover ; called for elevated troponin by Critical care   80 yr old with hx of ESRD on HD, HTN , HLD, IDDM , Chronic cardiomyopathy Ef 25% , s/p sigmoid colectomy with end colostomy for perforated sigmoid diverticulitis performed today now with elevated troponin of 3.32 . Pt hypotensive post procedure s/p 3.5 L fluid bolus now on 50 mcg of Neo for shock and SBP in 70-90mm Hg range. She remains intubated moving extremities sponatneously .  Pre-cordial exam RRR, no MGR , clear lung sounds. X-ray shows no ASD., EKG shows ectopic atrial tachycardia with  LVH , anterolateral ST depression and TWI. Hg 10.2. Pt was being by Dr Eldridge DaceVaranasi for accelerated junctional rythym.  Ass; suspect demand ischemia in setting of shock post procedure . However pt does have multiple cardiac risk factors  Recs  - Add  aspirin PR or via , add zocor 40QHS , - Anticoagulation if okah with surgery  - recommend avoiding pure peripheral vasopressor Neo and add Dopamine or Levo to maintain pressure in this pt with known severe cardiomyopathy - Cycle cardiac enzymes  - management of shock, need for central IV line  and family discussion for prognosis per Critical care team.   Donnella ShamMian Atif Jimmey Hengel , M.D  Cardiology , [employer]

## 2014-01-09 NOTE — Progress Notes (Signed)
eLink Physician-Brief Progress Note Patient Name: Kimberly Norman DOB: 1919-03-12 MRN: [id number]  Date of Service  01/18/2014   HPI/Events of Note   Discussed with cardiology They will see   eICU Interventions  Change neo to dopamine per their rec   Intervention Category Major Interventions: Shock - evaluation and management  [name] 01/21/2014, 7:04 PM

## 2014-01-09 NOTE — Progress Notes (Signed)
I have seen and examined the patient and agree with the assessment and plans. Abdomen still fairly soft.  WBC is up however.  I think if she is not improved by tomorrow she will either need an exploratory laparotomy or a CT scan.  Kimberly Norman A. Magnus IvanBlackman  MD, FACS

## 2014-01-09 NOTE — Consult Note (Signed)
PULMONARY / CRITICAL CARE MEDICINE   Name: Kimberly Norman MRN: [id number] DOB: 12/31/1918    ADMISSION DATE:  12/10/2013 CONSULTATION DATE:  01/18/2014  REFERRING MD :  Magnus Ivan  CHIEF COMPLAINT:  Post  management  INITIAL PRESENTATION: 80 y.o woman with DM-2,  ischemic cardiomyopathy, EF 25%, ESRD on HD, presented 7/31 with perforated sigmoid diverticulitis requiring emergent Sigmoid colectomy with end-colostomy . PCCM consulted post op    STUDIES:  CT abd 7/31 >>Acute mid sigmoid diverticulitis with findings worrisome for at  least micro perforation  SIGNIFICANT EVENTS: 8/1 Sigmoid colectomy with end-colostomy   HISTORY OF PRESENT ILLNESS:  81 y.o woman with DM-2,  ischemic cardiomyopathy, EF 25%, ESRD on HD, presented 7/31 with perforated sigmoid diverticulitis requiring emergent Sigmoid colectomy with end-colostomy . She lives independently, dialyses M/W/F, last 7/31 presented 7/31 with 4 ds of abd pain LLQ & constipation Developed accl junctional rhythm & hypotension  on HD -cards consulted Currently intubated & sedated , record reviewed for data  PAST MEDICAL HISTORY :  Past Medical History  Diagnosis Date  . Hyperlipidemia   . Hypertension   . Diabetes mellitus Age 79    using insulin  . Arthritis   . Osteoporosis   . Cataract   . Constipation 10/26/10  . Gout   . Anemia     Receives iron infusions per nephrology  . CHF (congestive heart failure)   . On home oxygen therapy     2 l  . Anxiety     situational- surgery  . Chronic kidney disease     ESRD. Kidney dz since ~2008. s/p fistula placement.. Low office weight 143.   Marland Kitchen Renal artery stenosis     Angiogram 2006 shoed L RAS  60% and 30% stenosis in R renal artery.    . Ectopic atrial rhythm    Past Surgical History  Procedure Laterality Date  . Renal angiogram  2002  . Cataract extraction, bilateral    . Hand surgery      right  . Av fistula placement  01/10/2011    Right radiocephalic AVF  . Insertion  of dialysis catheter      right shoulder  . Revison of arteriovenous fistula Right 08/19/2012    Procedure: REVISON OF ARTERIOVENOUS FISTULA;  Surgeon: Sherren Kerns, MD;  Location: [facility] OR;  Service: Vascular;  Laterality: Right;  Side Branch Ligation of Right Arm AVF   Prior to Admission medications   Medication Sig Start Date End Date Taking? Authorizing Provider  albuterol (PROVENTIL HFA;VENTOLIN HFA) 108 (90 BASE) MCG/ACT inhaler Inhale 2 puffs into the lungs every 6 (six) hours as needed. For shortness of breath   Yes Historical Provider, MD  allopurinol (ZYLOPRIM) 100 MG tablet Take 50 mg by mouth every morning.   Yes Historical Provider, MD  atorvastatin (LIPITOR) 40 MG tablet Take 40 mg by mouth daily.    Yes Historical Provider, MD  carvedilol (COREG) 3.125 MG tablet Take 3.125 mg by mouth 2 (two) times daily with a meal.   Yes Historical Provider, MD  cyanocobalamin 2000 MCG tablet Take 2,000 mcg by mouth daily.   Yes Historical Provider, MD  docusate sodium (COLACE) 100 MG capsule Take 100 mg by mouth 2 (two) times daily as needed. For constipation   Yes Historical Provider, MD  glimepiride (AMARYL) 1 MG tablet Take 0.5 mg by mouth daily with breakfast.   Yes Historical Provider, MD  HYDROcodone-acetaminophen (NORCO/VICODIN) 5-325 MG per tablet Take 1-2 tablets by  mouth every 6 (six) hours as needed for moderate pain (coughing or anxiety).   Yes Historical Provider, MD  hydrOXYzine (ATARAX/VISTARIL) 25 MG tablet Take 25 mg by mouth daily as needed for anxiety or itching (give only as needed).   Yes Historical Provider, MD  linagliptin (TRADJENTA) 5 MG TABS tablet Take 1.25 mg by mouth daily.   Yes Historical Provider, MD  LORazepam (ATIVAN) 0.5 MG tablet Take 0.5 mg by mouth 2 (two) times daily as needed for anxiety.   Yes Historical Provider, MD  polyethylene glycol (MIRALAX / GLYCOLAX) packet Take 17 g by mouth daily as needed. For constipation   Yes Historical Provider, MD  sorbitol  70 % solution Take 60 mLs by mouth daily as needed (for constipation).    Yes Historical Provider, MD  VELPHORO 500 MG chewable tablet Chew 500 mg by mouth 3 (three) times daily with meals. 12/04/13  Yes Historical Provider, MD  zolpidem (AMBIEN) 5 MG tablet Take 5 mg by mouth at bedtime as needed (insomnia).    Historical Provider, MD   No Known Allergies  FAMILY HISTORY:  Family History  Problem Relation Age of Onset  . Cancer Mother 92    pancreatic cancer  . Hypertension Mother   . Heart disease Father     presumed. died at age 65   SOCIAL HISTORY:  reports that she quit smoking about 32 years ago. Her smoking use included Cigarettes. She smoked 0.00 packs per day for 17 years. She has never used smokeless tobacco. She reports that she does not drink alcohol or use illicit drugs.  REVIEW OF SYSTEMS:  Unable to obtain  SUBJECTIVE:   VITAL SIGNS: Temp:  [97.7 F (36.5 C)-98.3 F (36.8 C)] 98.3 F (36.8 C) (08/01 1129) Pulse Rate:  [85-102] 94 (08/01 1400) Resp:  [12-29] 12 (08/01 1400) BP: (91-118)/(53-70) 105/64 mmHg (08/01 1129) SpO2:  [93 %-100 %] 100 % (08/01 1400) FiO2 (%):  [50 %] 50 % (08/01 1400) Weight:  [146 lb 9.7 oz (66.5 kg)-149 lb 7.6 oz (67.8 kg)] 146 lb 9.7 oz (66.5 kg) (07/31 1922) HEMODYNAMICS:   VENTILATOR SETTINGS: Vent Mode:  [-] PRVC FiO2 (%):  [50 %] 50 % Set Rate:  [14 bmp] 14 bmp Vt Set:  [570 mL] 570 mL PEEP:  [5 cmH20] 5 cmH20 Plateau Pressure:  [23 cmH20] 23 cmH20 INTAKE / OUTPUT:  Intake/Output Summary (Last 24 hours) at 02/08/2014 1446 Last data filed at 01/28/2014 1330  Gross per 24 hour  Intake   1050 ml  Output   1354 ml  Net   -304 ml    PHYSICAL EXAMINATION: Gen.elderly, acutely ill, well-nourished, in no distress, oral intubated ENT - no lesions, no post nasal drip Neck: No JVD, no thyromegaly, no carotid bruits Lungs: no use of accessory muscles, no dullness to percussion, clear without rales or rhonchi  Cardiovascular: Rhythm  regular, heart sounds  normal, no murmurs, no peripheral edema Abdomen: soft , distended,  non-tender, colostomy,no hepatosplenomegaly, BS normal. Musculoskeletal: No deformities, no cyanosis or clubbing Neuro:  sedated, non focal Skin:  Warm, no lesions/ rash   LABS:  CBC  Recent Labs Lab 12/10/2013 1001 01/29/2014 0555  WBC 13.3* 15.2*  HGB 10.2* 10.2*  HCT 31.2* 32.0*  PLT 235 259   Coag's No results found for this basename: APTT, INR,  in the last 168 hours BMET  Recent Labs Lab 01/04/2014 1001 01/26/2014 0555  NA 130* 134*  K 5.1 4.4  CL 85* 90*  CO2 25 22  BUN 58* 33*  CREATININE 5.11* 3.00*  GLUCOSE 230* 152*   Electrolytes  Recent Labs Lab 12/13/2013 1001 2014-01-29 0555  CALCIUM 8.9 8.6   Sepsis Markers  Recent Labs Lab 12/11/2013 0946  LATICACIDVEN 2.2   ABG No results found for this basename: PHART, PCO2ART, PO2ART,  in the last 168 hours Liver Enzymes  Recent Labs Lab 01/03/2014 1001  AST 12  ALT 7  ALKPHOS 77  BILITOT 0.8  ALBUMIN 2.8*   Cardiac Enzymes No results found for this basename: TROPONINI, PROBNP,  in the last 168 hours Glucose  Recent Labs Lab 12/27/2013 1458 01/07/2014 2027 01/29/14 0016 Jan 29, 2014 0053 2014-01-29 0551 Jan 29, 2014 1137  GLUCAP 187* 81 133* 168* 164* 130*    Imaging Ct Abdomen Pelvis W Contrast  12/24/2013   CLINICAL DATA:  Lower abdominal pain and tightness without nausea or vomiting; dialysis dependent renal failure, hysterectomy, diabetes, and CHF  EXAM: CT ABDOMEN AND PELVIS WITH CONTRAST  TECHNIQUE: Multidetector CT imaging of the abdomen and pelvis was performed using the standard protocol following bolus administration of intravenous contrast.  CONTRAST:  OMNIPAQUE IOHEXOL 300 MG/ML SOLN intravenously. The patient also received oral contrast material.  COMPARISON:  None.  FINDINGS: The mid and distal sigmoid colon exhibit abnormal wall thickening with surrounding inflammatory changes. There is perisigmoid  fluid. A tiny gas collection is demonstrated on image 51 of series 2. This is extraluminal. There is a moderate amount of stool in the sigmoid colon. More proximally the colonic stool burden is less. There is no small bowel obstruction.  The gallbladder is adequately distended and exhibits no calcified stones. The liver, adrenal glands, pancreas, and stomach are unremarkable. There are innumerable calcifications within the normal sized spleen. The abdominal aorta exhibits mural calcification but no aneurysm. The kidneys are atrophic. There is a cysts associated with the lower pole of the left kidney measuring 3.5 cm in diameter.  There are emphysematous changes at the lung bases. There are fibrotic changes versus atelectasis posteriorly in both lower lobes. The cardiac chambers are enlarged. There are degenerative changes of the lumbar spine and of both hips.  IMPRESSION: 1. Acute mid sigmoid diverticulitis with findings worrisome for at least micro perforation. No discrete drainable abscess is demonstrated. 2. There is no acute bowel abnormality elsewhere. 3. There is no acute hepatobiliary abnormality. 4. These results were called by telephone at the time of interpretation on 12/27/2013 at 12:49 pm to Dr. Wynetta Emery , who verbally acknowledged these results.   Electronically Signed   By: David  [place]   On: 12/31/2013 12:49     ASSESSMENT / PLAN:  PULMONARY OETT8/1 >> A:Acute resp failure P:   Vent bundle SBTs in am with goal extubation  CARDIOVASCULAR  A: Ischemic cardiomyopathy - 25% P:  Trop x 1 Post op ekg  RENAL A:  ESRD P:   HD per renal  GASTROINTESTINAL A:  Colon Perforation P:   Npo for now Wound care per surgery  HEMATOLOGIC A:  Leucocytosis P:  monitor  INFECTIOUS A:  Peritonitis P:   BCx2 8/1 >> Abx: zosyn, start date7/31>> Diflucan 8/1 >>  ENDOCRINE A:  DM-2   P:   SSI  NEUROLOGIC A:  Post op pain P:   RASS goal: 0 to -1 Fent/ morphine prn On  ativan at home - so low dose benzo ok  TODAY'S SUMMARY: Post op mechanical ventilation, given co-morbidities , reasonable to wean slow over next 24h , ensure  everything ok priro to extubation.  I have personally obtained a history, examined the patient, evaluated laboratory and imaging results, formulated the assessment and plan and placed orders. CRITICAL CARE: The patient is critically ill with multiple organ systems failure and requires high complexity decision making for assessment and support, frequent evaluation and titration of therapies, application of advanced monitoring technologies and extensive interpretation of multiple databases. Critical Care Time devoted to patient care services described in this note is 50 minutes.    Cyril Mourningakesh Alva MD. Tonny BollmanFCCP. [facility] Pager [phone] If no response call [phone]    01/17/2014, 2:46 PM

## 2014-01-09 NOTE — Progress Notes (Signed)
Patient Demographics  Kimberly Norman, is a 80 y.o. female, DOB - [birth year], ZOX:[id number]  Admit date - 12/10/2013   Admitting Physician Leroy SeaPrashant K Singh, MD  Outpatient Primary MD for the patient is Pearson GrippeKIM, [name], MD  LOS - 1   Chief Complaint  Patient presents with  . Abdominal Pain        Subjective:   Kimberly Norman today has, No headache, No chest pain, ++ abdominal pain (worse today after physical exam done by one of the care team members. She was stable earlier this morning around 7:20 AM soon thereafter  I was interrupted around 8 AM by the daughter in the hallway the patient's pain had abruptly gotten worse after repeat physical exam done after I saw the patient) - No Nausea, No new weakness tingling or numbness, No Cough - SOB.     Assessment & Plan    1. Acute diverticulitis with microperforation - pain got worse, x-ray shows free air in the left lower quadrant at the site of microperforation, she is n.p.o. with IV fluids and IV Zosyn on board. Have informed general surgery to relook at the patient after the early examination this morning. May require surgery. Told the daughter and patient clearly she will be a very high-risk candidate for any surgical intervention due to her underlying comorbidities. They accept the risk and want to proceed with surgery if needed.    2.ESRD. Monday Wednesday Friday dialysis schedule, on schedule renal on board.     3. Hypertension. Stable we'll give as needed IV Lopressor and hydralazine.    4. DM type II. On oral hypoglycemics, hold, every 6 sliding scale.   Lab Results  Component Value Date   HGBA1C 6.9* 12/19/2013    CBG (last 3)   Recent Labs  01/10/2014 0016 02/03/2014 0053 01/13/2014 0551  GLUCAP 133* 168* 164*     5. Dyslipidemia -new  home dose statin once taking orally.    6.AOCD - stable.     7. Accelerated junctional rhythm noted on telemetry without any symptoms during dialysis on 01/07/2014. Cardiology consulted. As needed IV Lopressor, once taking oral diet we'll place on Coreg, TSH stable. Symptom-free on telemetry.      Code Status: Full, condition is guarded  Family Communication: daughter  Disposition Plan: TBD   Procedures CT Abd   Consults  CCS   Medications  Scheduled Meds: . darbepoetin (ARANESP) injection - DIALYSIS  40 mcg Intravenous Q Fri-HD  . heparin  20 Units/kg Dialysis Once in dialysis  . heparin  5,000 Units Subcutaneous 3 times per day  . insulin aspart  0-9 Units Subcutaneous Q6H  . piperacillin-tazobactam (ZOSYN)  IV  2.25 g Intravenous 3 times per day  . sodium chloride  3 mL Intravenous Q12H   Continuous Infusions:  PRN Meds:.sodium chloride, heparin, HYDROcodone-acetaminophen, hydrOXYzine, lidocaine (PF), lidocaine-prilocaine, LORazepam, LORazepam, metoprolol, morphine injection, ondansetron (ZOFRAN) IV, ondansetron, pentafluoroprop-tetrafluoroeth  DVT Prophylaxis   Heparin    Lab Results  Component Value Date   PLT 259 01/22/2014    Antibiotics     Anti-infectives   Start     Dose/Rate Route Frequency Ordered Stop   12/13/2013 1330  piperacillin-tazobactam (ZOSYN) IVPB 2.25 g     2.25 g  100 mL/hr over 30 Minutes Intravenous 3 times per day 12/21/2013 1303            Objective:   Filed Vitals:   12/18/2013 1922 12/09/2013 1930 01/07/2014 2139 01/21/2014 0453  BP: 104/68 107/70 117/69 108/68  Pulse: 100 102  88  Temp: 98 F (36.7 C)  97.7 F (36.5 C) 98.1 F (36.7 C)  TempSrc: Oral  Oral Oral  Resp: 22 20 18 18   Height:      Weight: 66.5 kg (146 lb 9.7 oz)     SpO2:        Wt Readings from Last 3 Encounters:  12/19/2013 66.5 kg (146 lb 9.7 oz)  12/25/12 57.153 kg (126 lb)  09/04/12 56.7 kg (125 lb)     Intake/Output Summary (Last 24 hours) at 01/13/2014  1046 Last data filed at 01/12/2014 0540  Gross per 24 hour  Intake     50 ml  Output   1354 ml  Net  -1304 ml     Physical Exam  Awake Alert, Oriented X 3, No new F.N deficits, Normal affect [place].AT,PERRAL Supple Neck,No JVD, No cervical lymphadenopathy appriciated.  Symmetrical Chest wall movement, Good air movement bilaterally, CTAB RRR,No Gallops,Rubs or new Murmurs, No Parasternal Heave +ve B.Sounds, Abd Soft, ++LLQ tenderness, No organomegaly appriciated, No rebound - mild guarding   No Cyanosis, Clubbing or edema, No new Rash or bruise      Data Review   Micro Results No results found for this or any previous visit (from the past 240 hour(s)).  Radiology Reports Ct Abdomen Pelvis W Contrast  12/27/2013   CLINICAL DATA:  Lower abdominal pain and tightness without nausea or vomiting; dialysis dependent renal failure, hysterectomy, diabetes, and CHF  EXAM: CT ABDOMEN AND PELVIS WITH CONTRAST  TECHNIQUE: Multidetector CT imaging of the abdomen and pelvis was performed using the standard protocol following bolus administration of intravenous contrast.  CONTRAST:  OMNIPAQUE IOHEXOL 300 MG/ML SOLN intravenously. The patient also received oral contrast material.  COMPARISON:  None.  FINDINGS: The mid and distal sigmoid colon exhibit abnormal wall thickening with surrounding inflammatory changes. There is perisigmoid fluid. A tiny gas collection is demonstrated on image 51 of series 2. This is extraluminal. There is a moderate amount of stool in the sigmoid colon. More proximally the colonic stool burden is less. There is no small bowel obstruction.  The gallbladder is adequately distended and exhibits no calcified stones. The liver, adrenal glands, pancreas, and stomach are unremarkable. There are innumerable calcifications within the normal sized spleen. The abdominal aorta exhibits mural calcification but no aneurysm. The kidneys are atrophic. There is a cysts associated with the lower  pole of the left kidney measuring 3.5 cm in diameter.  There are emphysematous changes at the lung bases. There are fibrotic changes versus atelectasis posteriorly in both lower lobes. The cardiac chambers are enlarged. There are degenerative changes of the lumbar spine and of both hips.  IMPRESSION: 1. Acute mid sigmoid diverticulitis with findings worrisome for at least micro perforation. No discrete drainable abscess is demonstrated. 2. There is no acute bowel abnormality elsewhere. 3. There is no acute hepatobiliary abnormality. 4. These results were called by telephone at the time of interpretation on 12/12/2013 at 12:49 pm to Dr. Wynetta Emery , who verbally acknowledged these results.   Electronically Signed   By: David  [place]   On: 12/28/2013 12:49   Dg Abd Portable 1v  02/04/2014   CLINICAL DATA:  Two  week history of constipation now with 1 hr history of right lower quadrant pain.  EXAM: PORTABLE ABDOMEN - 1 VIEW  COMPARISON:  CT scan abdomen and pelvis 12/15/2013  FINDINGS: Free intraperitoneal air are in the left lower quadrant as identified by the double wall sign of the sigmoid colon. This is in the region of perforated diverticulitis seen on the prior CT imaging. Old granulomatous disease throughout the spleen. Multilevel degenerative disc disease. Moderate colonic stool burden consistent with constipation. No evidence of bowel obstruction.  IMPRESSION: Focal free air in the left lower quadrant in the region of the perforated diverticulitis.  Persistent moderate colonic stool burden consistent with constipation.  No evidence of developing bowel obstruction.  These results were called by telephone at the time of interpretation on 01/26/2014 at 9:43 am to Dr. Luretha Murphy who verbally acknowledged these results.   Electronically Signed   By: Malachy Moan M.D.   On: 01/27/2014 09:19    CBC  Recent Labs Lab 12/12/2013 1001 01/23/2014 0555  WBC 13.3* 15.2*  HGB 10.2* 10.2*  HCT 31.2* 32.0*    PLT 235 259  MCV 97.5 98.8  MCH 31.9 31.5  MCHC 32.7 31.9  RDW 14.1 14.3  LYMPHSABS 0.4*  --   MONOABS 0.7  --   EOSABS 0.0  --   BASOSABS 0.0  --     Chemistries   Recent Labs Lab 12/30/2013 1001 01/15/2014 0555  NA 130* 134*  K 5.1 4.4  CL 85* 90*  CO2 25 22  GLUCOSE 230* 152*  BUN 58* 33*  CREATININE 5.11* 3.00*  CALCIUM 8.9 8.6  AST 12  --   ALT 7  --   ALKPHOS 77  --   BILITOT 0.8  --    ------------------------------------------------------------------------------------------------------------------ estimated creatinine clearance is 10.5 ml/min (by C-G formula based on Cr of 3). ------------------------------------------------------------------------------------------------------------------  Recent Labs  12/11/2013 1001  HGBA1C 6.9*   ------------------------------------------------------------------------------------------------------------------ No results found for this basename: CHOL, HDL, LDLCALC, TRIG, CHOLHDL, LDLDIRECT,  in the last 72 hours ------------------------------------------------------------------------------------------------------------------  Recent Labs  12/16/2013 1547  TSH 1.020   ------------------------------------------------------------------------------------------------------------------ No results found for this basename: VITAMINB12, FOLATE, FERRITIN, TIBC, IRON, RETICCTPCT,  in the last 72 hours  Coagulation profile No results found for this basename: INR, PROTIME,  in the last 168 hours  No results found for this basename: DDIMER,  in the last 72 hours  Cardiac Enzymes No results found for this basename: CK, CKMB, TROPONINI, MYOGLOBIN,  in the last 168 hours ------------------------------------------------------------------------------------------------------------------ No components found with this basename: POCBNP,      Time Spent in minutes   35   SINGH,PRASHANT K M.D on 01/27/2014 at 10:46 AM  Between 7am to 7pm  - Pager - [phone]  After 7pm go to [email or web address] - password [email or web address]  And look for the night coverage person covering for me after hours  [facility] Office  [phone]   **Disclaimer: This note may have been dictated with voice recognition software. Similar sounding words can inadvertently be transcribed and this note may contain transcription errors which may not have been corrected upon publication of note.**

## 2014-01-09 DEATH — deceased

## 2014-01-10 DIAGNOSIS — I214 Non-ST elevation (NSTEMI) myocardial infarction: Secondary | ICD-10-CM

## 2014-01-10 DIAGNOSIS — R6521 Severe sepsis with septic shock: Secondary | ICD-10-CM

## 2014-01-10 DIAGNOSIS — A419 Sepsis, unspecified organism: Secondary | ICD-10-CM

## 2014-01-10 LAB — CBC
HCT: 31.2 % — ABNORMAL LOW (ref 36.0–46.0)
HEMOGLOBIN: 10 g/dL — AB (ref 12.0–15.0)
MCH: 31.5 pg (ref 26.0–34.0)
MCHC: 32.1 g/dL (ref 30.0–36.0)
MCV: 98.4 fL (ref 78.0–100.0)
PLATELETS: 291 10*3/uL (ref 150–400)
RBC: 3.17 MIL/uL — AB (ref 3.87–5.11)
RDW: 14.7 % (ref 11.5–15.5)
WBC: 12.2 10*3/uL — ABNORMAL HIGH (ref 4.0–10.5)

## 2014-01-10 LAB — GLUCOSE, CAPILLARY
GLUCOSE-CAPILLARY: 188 mg/dL — AB (ref 70–99)
GLUCOSE-CAPILLARY: 170 mg/dL — AB (ref 70–99)
Glucose-Capillary: 145 mg/dL — ABNORMAL HIGH (ref 70–99)
Glucose-Capillary: 158 mg/dL — ABNORMAL HIGH (ref 70–99)
Glucose-Capillary: 181 mg/dL — ABNORMAL HIGH (ref 70–99)

## 2014-01-10 LAB — BASIC METABOLIC PANEL
ANION GAP: 22 — AB (ref 5–15)
BUN: 53 mg/dL — AB (ref 6–23)
CHLORIDE: 96 meq/L (ref 96–112)
CO2: 21 mEq/L (ref 19–32)
Calcium: 8.6 mg/dL (ref 8.4–10.5)
Creatinine, Ser: 3.95 mg/dL — ABNORMAL HIGH (ref 0.50–1.10)
GFR calc non Af Amer: 9 mL/min — ABNORMAL LOW (ref >90)
GFR, EST AFRICAN AMERICAN: 10 mL/min — AB (ref >90)
Glucose, Bld: 157 mg/dL — ABNORMAL HIGH (ref 70–99)
Potassium: 5.3 mEq/L (ref 3.7–5.3)
Sodium: 139 mEq/L (ref 137–147)

## 2014-01-10 LAB — TROPONIN I
Troponin I: 4.02 ng/mL (ref <0.30)
Troponin I: 4.59 ng/mL (ref <0.30)

## 2014-01-10 LAB — CARBOXYHEMOGLOBIN
Carboxyhemoglobin: 1.2 % (ref 0.5–1.5)
METHEMOGLOBIN: 1.1 % (ref 0.0–1.5)
O2 Saturation: 54.8 %
TOTAL HEMOGLOBIN: 10.1 g/dL — AB (ref 12.0–16.0)

## 2014-01-10 MED ORDER — ACETAMINOPHEN 10 MG/ML IV SOLN
1000.0000 mg | Freq: Four times a day (QID) | INTRAVENOUS | Status: AC | PRN
  Administered 2014-01-10: 1000 mg via INTRAVENOUS
  Filled 2014-01-10 (×2): qty 100

## 2014-01-10 MED ORDER — SODIUM CHLORIDE 0.9 % IV SOLN
INTRAVENOUS | Status: DC
  Administered 2014-01-10: 16:00:00 via INTRAVENOUS

## 2014-01-10 MED ORDER — NOREPINEPHRINE BITARTRATE 1 MG/ML IV SOLN
2.0000 ug/min | INTRAVENOUS | Status: DC
  Administered 2014-01-10: 20 ug/min via INTRAVENOUS
  Filled 2014-01-10: qty 4

## 2014-01-10 MED ORDER — HALOPERIDOL LACTATE 5 MG/ML IJ SOLN
1.0000 mg | INTRAMUSCULAR | Status: DC | PRN
  Administered 2014-01-10 – 2014-01-11 (×3): 4 mg via INTRAVENOUS
  Filled 2014-01-10 (×3): qty 1

## 2014-01-10 MED ORDER — NOREPINEPHRINE BITARTRATE 1 MG/ML IV SOLN
2.0000 ug/min | INTRAVENOUS | Status: DC
  Administered 2014-01-10: 40 ug/min via INTRAVENOUS
  Administered 2014-01-10: 35 ug/min via INTRAVENOUS
  Administered 2014-01-11: 28 ug/min via INTRAVENOUS
  Administered 2014-01-11: 30 ug/min via INTRAVENOUS
  Filled 2014-01-10 (×4): qty 16

## 2014-01-10 NOTE — Progress Notes (Signed)
  [facility] Progress Note   Subjective: went to OR yesterday for acute ^ in abd pain, had washout, perforated colon, ostomy fashioned. In ICU on pressors and ventilator. Sedated  Filed Vitals:   01/10/14 2000 01/10/14 2004 01/10/14 2100 01/10/14 2200  BP: 126/95 126/95 110/38 122/48  Pulse: 94 98 94 41  Temp:  97.3 F (36.3 C)    TempSrc:      Resp: 19 22 20 14   Height:      Weight:      SpO2: 100% 100% 100% 100%   Exam: Sedated on vent NO jvd Chest is clear bilat RRR no MRG Abd very obese , dressings in place mid abd No LE or UE edema Neuro is sedated on vent R arm AVF +bruit  HD: MWF NW 4h   66kg   2/2.0 Bath   Heparin 5000   R AVF   350/A1.5 ARanesp 40 ug q week, Venofer 50 mg q week       Assessment: 1 Acute diverticulitis / rupture sigmoid colon - s/p sigmoid colectomy with end-colostomy 8/1 2 Septic shock on pressors 3 ESRD on HD 4 HTN on coreg, BP ok, no vol excess 5 Anemia cont aranesp 6 HPTH cont meds when eating again 7 DM per primary   Plan- if BP is better will try HD tomorrow, otherwise may need to consider CRRT    Vinson Moselleob Andrianna Manalang MD  pager [phone]    cell [phone]  02/04/2014, 8:34 AM     Recent Labs Lab 01/13/2014 0555 02/05/2014 2300 01/10/14 0400  NA 134* 137 139  K 4.4 4.6 5.3  CL 90* 94* 96  CO2 22 19 21   GLUCOSE 152* 144* 157*  BUN 33* 47* 53*  CREATININE 3.00* 3.47* 3.95*  CALCIUM 8.6 8.4 8.6    Recent Labs Lab 12/27/2013 1001 01/10/2014 2300  AST 12 24  ALT 7 12  ALKPHOS 77 71  BILITOT 0.8 0.6  PROT 7.4 6.7  ALBUMIN 2.8* 2.1*    Recent Labs Lab 12/21/2013 1001 01/15/2014 0555 02/05/2014 1610 01/10/14 0400  WBC 13.3* 15.2* 6.4 12.2*  NEUTROABS 12.2*  --   --   --   HGB 10.2* 10.2* 10.6* 10.0*  HCT 31.2* 32.0* 32.9* 31.2*  MCV 97.5 98.8 97.9 98.4  PLT 235 259 236 291   . antiseptic oral rinse  7 mL Mouth Rinse QID  . chlorhexidine  15 mL Mouth Rinse BID  . Chlorhexidine Gluconate Cloth  6 each Topical  Daily  . darbepoetin (ARANESP) injection - DIALYSIS  40 mcg Intravenous Q Fri-HD  . fluconazole (DIFLUCAN) IV  200 mg Intravenous Q24H  . heparin  20 Units/kg Dialysis Once in dialysis  . heparin  5,000 Units Subcutaneous 3 times per day  . insulin aspart  0-9 Units Subcutaneous Q6H  . mupirocin ointment  1 application Nasal BID  . pantoprazole (PROTONIX) IV  40 mg Intravenous Daily  . piperacillin-tazobactam (ZOSYN)  IV  2.25 g Intravenous 3 times per day  . sodium chloride  3 mL Intravenous Q12H   . sodium chloride 10 mL/hr at 01/10/14 1700  . fentaNYL infusion INTRAVENOUS 100 mcg/hr (01/10/14 2000)  . norepinephrine (LEVOPHED) Adult infusion 40 mcg/min (01/10/14 2000)   sodium chloride, acetaminophen, fentaNYL, fentaNYL, haloperidol lactate, heparin, hydrALAZINE, hydrOXYzine, lidocaine (PF), lidocaine-prilocaine, LORazepam, metoprolol, ondansetron (ZOFRAN) IV, pentafluoroprop-tetrafluoroeth

## 2014-01-10 NOTE — Progress Notes (Signed)
  [facility] Progress Note   Subjective: Still hurting mid lower abdomen  Filed Vitals:   01/10/14 2000 01/10/14 2004 01/10/14 2100 01/10/14 2200  BP: 126/95 126/95 110/38 122/48  Pulse: 94 98 94 41  Temp:  97.3 F (36.3 C)    TempSrc:      Resp: 19 22 20 14   Height:      Weight:      SpO2: 100% 100% 100% 100%   Exam: Lethargic elderly obese WF , sedated some on pain meds NO jvd Chest is clear bilat RRR no MRG Abd very obese , mild tenderness mid abdomen, no rebound No LE or UE edema Neuro is lethargic, can't stay awake, nonfocal R arm AVF +bruit  HD: MWF NW 4h   66kg   2/2.0 Bath   Heparin 5000   R AVF   350/A1.5 ARanesp 40 ug q week, Venofer 50 mg q week       Assessment: 1 Acute diverticulitis- on IV abx, npo, WBC 13, zosyn, per surgery 2 ESRD on HD, had HD last night here 3 HTN on coreg, BP ok, no vol excess 4 Anemia cont aranesp 5 HPTH cont meds when eating again 6 DM per primary   Plan- Next HD Monday    Vinson Moselleob Lakhia Gengler MD  pager [phone]    cell [phone]  01/23/2014, 8:34 AM     Recent Labs Lab 01/19/2014 0555 02/06/2014 2300 01/10/14 0400  NA 134* 137 139  K 4.4 4.6 5.3  CL 90* 94* 96  CO2 22 19 21   GLUCOSE 152* 144* 157*  BUN 33* 47* 53*  CREATININE 3.00* 3.47* 3.95*  CALCIUM 8.6 8.4 8.6    Recent Labs Lab December 27, 2013 1001 01/13/2014 2300  AST 12 24  ALT 7 12  ALKPHOS 77 71  BILITOT 0.8 0.6  PROT 7.4 6.7  ALBUMIN 2.8* 2.1*    Recent Labs Lab December 27, 2013 1001 02/07/2014 0555 01/13/2014 1610 01/10/14 0400  WBC 13.3* 15.2* 6.4 12.2*  NEUTROABS 12.2*  --   --   --   HGB 10.2* 10.2* 10.6* 10.0*  HCT 31.2* 32.0* 32.9* 31.2*  MCV 97.5 98.8 97.9 98.4  PLT 235 259 236 291   . antiseptic oral rinse  7 mL Mouth Rinse QID  . chlorhexidine  15 mL Mouth Rinse BID  . Chlorhexidine Gluconate Cloth  6 each Topical Daily  . darbepoetin (ARANESP) injection - DIALYSIS  40 mcg Intravenous Q Fri-HD  . fluconazole (DIFLUCAN) IV  200 mg  Intravenous Q24H  . heparin  20 Units/kg Dialysis Once in dialysis  . heparin  5,000 Units Subcutaneous 3 times per day  . insulin aspart  0-9 Units Subcutaneous Q6H  . mupirocin ointment  1 application Nasal BID  . pantoprazole (PROTONIX) IV  40 mg Intravenous Daily  . piperacillin-tazobactam (ZOSYN)  IV  2.25 g Intravenous 3 times per day  . sodium chloride  3 mL Intravenous Q12H   . sodium chloride 10 mL/hr at 01/10/14 1700  . fentaNYL infusion INTRAVENOUS 100 mcg/hr (01/10/14 2000)  . norepinephrine (LEVOPHED) Adult infusion 40 mcg/min (01/10/14 2000)   sodium chloride, acetaminophen, fentaNYL, fentaNYL, haloperidol lactate, heparin, hydrALAZINE, hydrOXYzine, lidocaine (PF), lidocaine-prilocaine, LORazepam, metoprolol, ondansetron (ZOFRAN) IV, pentafluoroprop-tetrafluoroeth

## 2014-01-10 NOTE — Progress Notes (Signed)
Notified Brett CanalesSteve Minor, NP of the pt's rectal temperature of 102.3 rectally.  Orders for cultures and IV tylenol were provided.

## 2014-01-10 NOTE — Progress Notes (Signed)
1 Day Post-Op  Subjective: Pt had rough night.  Looks like she is having MI.  Troponin still rising.  On pressors.    Objective: Vital signs in last 24 hours: Temp:  [97.4 F (36.3 C)-98.8 F (37.1 C)] 97.4 F (36.3 C) (08/02 0725) Pulse Rate:  [81-124] 93 (08/02 0727) Resp:  [10-34] 14 (08/02 0727) BP: (58-131)/(22-96) 93/44 mmHg (08/02 0727) SpO2:  [70 %-100 %] 100 % (08/02 0727) FiO2 (%):  [30 %-50 %] 30 % (08/02 0727) Weight:  [149 lb 7.6 oz (67.8 kg)] 149 lb 7.6 oz (67.8 kg) (08/02 0500) Last BM Date: 01/04/14  Intake/Output from previous day: 08/01 0701 - 08/02 0700 In: 3360 [I.V.:2290; NG/GT:120; IV Piggyback:950] Out: 160 [Urine:50; Drains:110] Intake/Output this shift:    General appearance: mild distress and sedated, gets agitated with any stimulation Resp: coarse breath sounds bilaterally Cardio: regular rate and rhythm GI: soft, sl distended.  dressing removed.  tissue OK.   Incision/Wound: Ostomy dusky and swollen, likely related to pressors.    Lab Results:   Recent Labs  02/01/2014 1610 01/10/14 0400  WBC 6.4 12.2*  HGB 10.6* 10.0*  HCT 32.9* 31.2*  PLT 236 291   BMET  Recent Labs  01/18/2014 2300 01/10/14 0400  NA 137 139  K 4.6 5.3  CL 94* 96  CO2 19 21  GLUCOSE 144* 157*  BUN 47* 53*  CREATININE 3.47* 3.95*  CALCIUM 8.4 8.6   PT/INR No results found for this basename: LABPROT, INR,  in the last 72 hours ABG  Recent Labs  02/01/2014 1515 02/02/2014 2321  PHART 7.515* 7.394  HCO3 20.1 21.4    Studies/Results: Ct Abdomen Pelvis W Contrast  12/26/2013   CLINICAL DATA:  Lower abdominal pain and tightness without nausea or vomiting; dialysis dependent renal failure, hysterectomy, diabetes, and CHF  EXAM: CT ABDOMEN AND PELVIS WITH CONTRAST  TECHNIQUE: Multidetector CT imaging of the abdomen and pelvis was performed using the standard protocol following bolus administration of intravenous contrast.  CONTRAST:  100mL OMNIPAQUE IOHEXOL 300  MG/ML SOLN intravenously. The patient also received oral contrast material.  COMPARISON:  None.  FINDINGS: The mid and distal sigmoid colon exhibit abnormal wall thickening with surrounding inflammatory changes. There is perisigmoid fluid. A tiny gas collection is demonstrated on image 51 of series 2. This is extraluminal. There is a moderate amount of stool in the sigmoid colon. More proximally the colonic stool burden is less. There is no small bowel obstruction.  The gallbladder is adequately distended and exhibits no calcified stones. The liver, adrenal glands, pancreas, and stomach are unremarkable. There are innumerable calcifications within the normal sized spleen. The abdominal aorta exhibits mural calcification but no aneurysm. The kidneys are atrophic. There is a cysts associated with the lower pole of the left kidney measuring 3.5 cm in diameter.  There are emphysematous changes at the lung bases. There are fibrotic changes versus atelectasis posteriorly in both lower lobes. The cardiac chambers are enlarged. There are degenerative changes of the lumbar spine and of both hips.  IMPRESSION: 1. Acute mid sigmoid diverticulitis with findings worrisome for at least micro perforation. No discrete drainable abscess is demonstrated. 2. There is no acute bowel abnormality elsewhere. 3. There is no acute hepatobiliary abnormality. 4. These results were called by telephone at the time of interpretation on 12/10/2013 at 12:49 pm to Dr. Wynetta EmeryNICOLE PISCIOTTA , who verbally acknowledged these results.   Electronically Signed   By: David  [place]   On: 01/04/2014  12:49   Dg Chest Port 1 View  01/10/2014   CLINICAL DATA:  Central line placement  EXAM: PORTABLE CHEST - 1 VIEW  COMPARISON:  01/13/2014  FINDINGS: Interval placement of a right central venous catheter. Tip overlies the cavoatrial junction. No pneumothorax. Endotracheal tube tip measures 4.6 cm above the carinal. Enteric tube tip is off the field of view but below  the left hemidiaphragm. Shallow inspiration. Cardiac enlargement with mild pulmonary vascular congestion. Interstitial changes in the lung bases may represent early edema or atelectasis. No blunting of costophrenic angles.  IMPRESSION: Appliances appear in satisfactory position. Right central venous catheter tip overlies the cavoatrial junction. No pneumothorax. Persistent cardiac enlargement with developing vascular congestion and possible early interstitial edema versus atelectasis.   Electronically Signed   By: Burman Nieves M.D.   On: 01/10/2014 02:39   Dg Chest Port 1 View  02/04/2014   CLINICAL DATA:  Intubated  EXAM: PORTABLE CHEST - 1 VIEW  COMPARISON:  08/04/2013  FINDINGS: The heart size is at upper limits of normal. Lungs are hypoaerated with crowding of the bronchovascular markings. Both lungs are clear. The visualized skeletal structures are unremarkable. Nasogastric tube tip terminates below the level of the hemidiaphragms but is not included in the field of view. Endotracheal tube tip is positioned 2.7 cm above the carina.  IMPRESSION: Support apparatus as above. Cardiomegaly without focal acute finding.   Electronically Signed   By: Christiana Pellant M.D.   On: 01/18/2014 15:24   Dg Abd Portable 1v  01/16/2014   CLINICAL DATA:  Two week history of constipation now with 1 hr history of right lower quadrant pain.  EXAM: PORTABLE ABDOMEN - 1 VIEW  COMPARISON:  CT scan abdomen and pelvis 09-Jan-2014  FINDINGS: Free intraperitoneal air are in the left lower quadrant as identified by the double wall sign of the sigmoid colon. This is in the region of perforated diverticulitis seen on the prior CT imaging. Old granulomatous disease throughout the spleen. Multilevel degenerative disc disease. Moderate colonic stool burden consistent with constipation. No evidence of bowel obstruction.  IMPRESSION: Focal free air in the left lower quadrant in the region of the perforated diverticulitis.  Persistent  moderate colonic stool burden consistent with constipation.  No evidence of developing bowel obstruction.  These results were called by telephone at the time of interpretation on 01/30/2014 at 9:43 am to Dr. Luretha Murphy who verbally acknowledged these results.   Electronically Signed   By: Malachy Moan M.D.   On: 02/01/2014 09:19    Anti-infectives: Anti-infectives   Start     Dose/Rate Route Frequency Ordered Stop   01/25/2014 1800  fluconazole (DIFLUCAN) IVPB 200 mg     200 mg 100 mL/hr over 60 Minutes Intravenous Every 24 hours 01/15/2014 1509     Jan 09, 2014 1330  piperacillin-tazobactam (ZOSYN) IVPB 2.25 g     2.25 g 100 mL/hr over 30 Minutes Intravenous 3 times per day 02/08/2014 1303        Assessment/Plan: s/p Procedure(s): Sigmiod colon resection (N/A) COLOSTOMY (Left) profound abdominal contamination Broad spectrum antibiotics Septic shock Cardiogenic shock from MI  Keep eye on ostomy.  As BP improves, hopefully ostomy will improve as well.  Likely outer layer will slough.  OK to start anticoagulation if needed for MI, but would avoid large bolus to start.   Keep NPO for now.     LOS: 2 days    [facility] 01/10/2014

## 2014-01-10 NOTE — Progress Notes (Signed)
PULMONARY / CRITICAL CARE MEDICINE   Name: Kimberly Norman MRN: [id number] DOB: 03-26-1919    ADMISSION DATE:  12/22/2013 CONSULTATION DATE:  01/10/2014  REFERRING MD :  Magnus IvanBlackman  CHIEF COMPLAINT:  Post  management  INITIAL PRESENTATION: 80 y.o woman with DM-2,  ischemic cardiomyopathy, EF 25%, ESRD on HD, presented 7/31 with perforated sigmoid diverticulitis requiring emergent Sigmoid colectomy with end-colostomy . PCCM consulted post op    STUDIES:  CT abd 7/31 >>Acute mid sigmoid diverticulitis with findings worrisome for at  least micro perforation  SIGNIFICANT EVENTS: 8/1 Sigmoid colectomy with end-colostomy 8/1 pressors, pos trops  SUBJECTIVE: sedated on fent gtt  Needed low dose dopmaine overnight afebrile  VITAL SIGNS: Temp:  [97.4 F (36.3 C)-98.8 F (37.1 C)] 97.4 F (36.3 C) (08/02 0725) Pulse Rate:  [81-124] 90 (08/02 0700) Resp:  [10-34] 14 (08/02 0700) BP: (58-131)/(22-96) 93/44 mmHg (08/02 0700) SpO2:  [70 %-100 %] 100 % (08/02 0700) FiO2 (%):  [30 %-50 %] 30 % (08/02 0354) Weight:  [67.8 kg (149 lb 7.6 oz)] 67.8 kg (149 lb 7.6 oz) (08/02 0500) HEMODYNAMICS: CVP:  [12 mmHg-15 mmHg] 15 mmHg VENTILATOR SETTINGS: Vent Mode:  [-] PRVC FiO2 (%):  [30 %-50 %] 30 % Set Rate:  [14 bmp] 14 bmp Vt Set:  [500 mL-570 mL] 500 mL PEEP:  [5 cmH20] 5 cmH20 Plateau Pressure:  [11 cmH20-23 cmH20] 20 cmH20 INTAKE / OUTPUT:  Intake/Output Summary (Last 24 hours) at 01/10/14 0726 Last data filed at 01/10/14 0700  Gross per 24 hour  Intake 3360.02 ml  Output    160 ml  Net 3200.02 ml    PHYSICAL EXAMINATION: Gen.elderly, acutely ill,  in no distress, oral intubated ENT - no lesions, no post nasal drip Neck: No JVD, no thyromegaly, no carotid bruits Lungs: no use of accessory muscles, no dullness to percussion, clear without rales or rhonchi  Cardiovascular: Rhythm regular, heart sounds  normal, esm at base, no peripheral edema Abdomen: soft , distended,  Diffuse  tender, colostomy with bloody output,no hepatosplenomegaly Musculoskeletal: No deformities, no cyanosis or clubbing Neuro:  sedated, non focal Skin:  Warm, no lesions/ rash   LABS:  CBC  Recent Labs Lab 02/04/2014 0555 01/31/2014 1610 01/10/14 0400  WBC 15.2* 6.4 12.2*  HGB 10.2* 10.6* 10.0*  HCT 32.0* 32.9* 31.2*  PLT 259 236 291   Coag's No results found for this basename: APTT, INR,  in the last 168 hours BMET  Recent Labs Lab 01/31/2014 0555 01/21/2014 2300 01/10/14 0400  NA 134* 137 139  K 4.4 4.6 5.3  CL 90* 94* 96  CO2 22 19 21   BUN 33* 47* 53*  CREATININE 3.00* 3.47* 3.95*  GLUCOSE 152* 144* 157*   Electrolytes  Recent Labs Lab 01/15/2014 0555 01/13/2014 2300 01/10/14 0400  CALCIUM 8.6 8.4 8.6   Sepsis Markers  Recent Labs Lab 12/31/2013 0946 01/12/2014 2300  LATICACIDVEN 2.2 1.8   ABG  Recent Labs Lab 01/10/2014 1515 01/22/2014 2321  PHART 7.515* 7.394  PCO2ART 25.0* 34.8*  PO2ART 234.0* 86.0   Liver Enzymes  Recent Labs Lab 01/04/2014 1001 01/28/2014 2300  AST 12 24  ALT 7 12  ALKPHOS 77 71  BILITOT 0.8 0.6  ALBUMIN 2.8* 2.1*   Cardiac Enzymes  Recent Labs Lab 01/28/2014 1611 02/06/2014 2300 01/10/14 0400  TROPONINI 3.32* 3.49* 4.02*   Glucose  Recent Labs Lab 01/12/2014 0053 01/30/2014 0551 01/17/2014 1137 01/19/2014 1519 01/13/2014 2116 01/10/14 0017  GLUCAP 168* 164* 130*  128* 135* 158*    Imaging Dg Chest Port 1 View  01/10/2014   CLINICAL DATA:  Central line placement  EXAM: PORTABLE CHEST - 1 VIEW  COMPARISON:  02/02/2014  FINDINGS: Interval placement of a right central venous catheter. Tip overlies the cavoatrial junction. No pneumothorax. Endotracheal tube tip measures 4.6 cm above the carinal. Enteric tube tip is off the field of view but below the left hemidiaphragm. Shallow inspiration. Cardiac enlargement with mild pulmonary vascular congestion. Interstitial changes in the lung bases may represent early edema or atelectasis. No  blunting of costophrenic angles.  IMPRESSION: Appliances appear in satisfactory position. Right central venous catheter tip overlies the cavoatrial junction. No pneumothorax. Persistent cardiac enlargement with developing vascular congestion and possible early interstitial edema versus atelectasis.   Electronically Signed   By: Burman Nieves M.D.   On: 01/10/2014 02:39   Dg Chest Port 1 View  02/05/2014   CLINICAL DATA:  Intubated  EXAM: PORTABLE CHEST - 1 VIEW  COMPARISON:  08/04/2013  FINDINGS: The heart size is at upper limits of normal. Lungs are hypoaerated with crowding of the bronchovascular markings. Both lungs are clear. The visualized skeletal structures are unremarkable. Nasogastric tube tip terminates below the level of the hemidiaphragms but is not included in the field of view. Endotracheal tube tip is positioned 2.7 cm above the carina.  IMPRESSION: Support apparatus as above. Cardiomegaly without focal acute finding.   Electronically Signed   By: Christiana Pellant M.D.   On: 01/18/2014 15:24   Dg Abd Portable 1v  02/08/2014   CLINICAL DATA:  Two week history of constipation now with 1 hr history of right lower quadrant pain.  EXAM: PORTABLE ABDOMEN - 1 VIEW  COMPARISON:  CT scan abdomen and pelvis 01/07/2014  FINDINGS: Free intraperitoneal air are in the left lower quadrant as identified by the double wall sign of the sigmoid colon. This is in the region of perforated diverticulitis seen on the prior CT imaging. Old granulomatous disease throughout the spleen. Multilevel degenerative disc disease. Moderate colonic stool burden consistent with constipation. No evidence of bowel obstruction.  IMPRESSION: Focal free air in the left lower quadrant in the region of the perforated diverticulitis.  Persistent moderate colonic stool burden consistent with constipation.  No evidence of developing bowel obstruction.  These results were called by telephone at the time of interpretation on 02/02/2014 at 9:43  am to Dr. Luretha Murphy who verbally acknowledged these results.   Electronically Signed   By: Malachy Moan M.D.   On: 01/30/2014 09:19     ASSESSMENT / PLAN:  PULMONARY OETT8/1 >> A:Acute resp failure P:   Vent bundle Hold SBTs until off pressors & trops downtrending  CARDIOVASCULAR  A: Ischemic cardiomyopathy - 25% NSTEMI Septic shock P: dc dopamine Use levophed gtt, chk co-ox x1   RENAL A:  ESRD P:   HD per renal  GASTROINTESTINAL A:  Colon Perforation P:   Npo for now Wound care per surgery  HEMATOLOGIC A:  Leucocytosis P:  monitor  INFECTIOUS A:  Peritonitis P:   BCx2 8/1 >> Abx: zosyn, start date7/31>> Diflucan 8/1 >>  ENDOCRINE A:  DM-2   P:   SSI  NEUROLOGIC A:  Post op pain P:   RASS goal: 0 to -1 Fent gtt, use haldol prn On ativan at home - so low dose benzo ok  TODAY'S SUMMARY: Post op mechanical ventilation, given co-morbidities , reasonable to wean slow  Once off pressors. Limited code  per d/w niece. Prognosis guarded  I have personally obtained a history, examined the patient, evaluated laboratory and imaging results, formulated the assessment and plan and placed orders. CRITICAL CARE: The patient is critically ill with multiple organ systems failure and requires high complexity decision making for assessment and support, frequent evaluation and titration of therapies, application of advanced monitoring technologies and extensive interpretation of multiple databases. Critical Care Time devoted to patient care services described in this note is 35 minutes.    Cyril Mourning MD. Tonny Bollman. [facility] Pager [phone] If no response call [phone]    01/10/2014, 7:26 AM

## 2014-01-10 NOTE — Progress Notes (Signed)
Spoke with Roselyn BeringMegan Dart, P.A. of surgery to advise her that the pt has a temp. of 102.3 rectally and that CCM is providing orders for cultures and tylenol.

## 2014-01-10 NOTE — Progress Notes (Signed)
Power of attorney papers obtained from patient's niece, Susanne Greenhouselizabeth Green. Copy in patient's chart.

## 2014-01-10 NOTE — Progress Notes (Signed)
Dr. Donell BeersByerly examine post-op surgical sites for pt.  Dressing change performed with Dr. Donell BeersByerly.  Advised of the concern about the stoma site - yesterday this appeared to be a red & beefy but today is darkened w/ no red appearance with the exception of the outer edge. Possibly the initial stage of ischemic changes related to the pressors being required at this time to maintain perfusion.

## 2014-01-10 NOTE — Progress Notes (Signed)
Subjective:  Intubated but becomes agitated with any stimulation.  Objective:  Vital Signs in the last 24 hours: BP 90/68  Pulse 89  Temp(Src) 97.4 F (36.3 C) (Axillary)  Resp 18  Ht 5\' 5"  (1.651 m)  Wt 67.8 kg (149 lb 7.6 oz)  BMI 24.87 kg/m2  SpO2 100%  Physical Exam: Elderly female intubated and sedated comes agitated Lungs:  Reduced breath sounds Cardiac:  Regular rhythm with irregular beats, normal S1 and S2, no S3 Extremities:  No edema present  Intake/Output from previous day: 08/01 0701 - 08/02 0700 In: 3410 [I.V.:2290; NG/GT:120; IV Piggyback:1000] Out: 160 [Urine:50; Drains:110]  Weight Filed Weights   12/12/2013 1530 12/29/2013 1922 01/10/14 0500  Weight: 67.8 kg (149 lb 7.6 oz) 66.5 kg (146 lb 9.7 oz) 67.8 kg (149 lb 7.6 oz)    Lab Results: Basic Metabolic Panel:  Recent Labs  16/03/9607/14/2015 2300 01/10/14 0400  NA 137 139  K 4.6 5.3  CL 94* 96  CO2 19 21  GLUCOSE 144* 157*  BUN 47* 53*  CREATININE 3.47* 3.95*   CBC:  Recent Labs  12/13/2013 1001  02/07/2014 1610 01/10/14 0400  WBC 13.3*  < > 6.4 12.2*  NEUTROABS 12.2*  --   --   --   HGB 10.2*  < > 10.6* 10.0*  HCT 31.2*  < > 32.9* 31.2*  MCV 97.5  < > 97.9 98.4  PLT 235  < > 236 291  < > = values in this interval not displayed. Cardiac Enzymes:  Recent Labs  01/09/14 2300 01/10/14 0400 01/10/14 0956  TROPONINI 3.49* 4.02* 4.59*    Telemetry: Sinus with PACs  Assessment/Plan:  1. Non-STEMI likely due to hypotension, severe anemia and stress of surgery she does have underlying risk factors but is not a candidate for cardiac intervention with cath at this time. 2. End-stage renal disease 3. History of ectopic atrial rhythm   4. Continue to hypotension  Recommendations:  Evidence of evening were reviewed. She is not a candidate for cardiac intervention due to her age and other comorbidities as well as renal failure. Hypertension is still a problem. Prognosis appears very poor at this  time. Continue current therapies.  Darden PalmerW. Spencer Tilley, Jr.  MD [facility]  01/10/2014, 12:03 PM

## 2014-01-10 NOTE — Progress Notes (Signed)
Pt is not married and does not have any children.  Her next-of-kin are 2 nieces and one nephew.  One of the nieces here is her healthcare POA.  I requested that she bring a copy in for our records.

## 2014-01-11 ENCOUNTER — Inpatient Hospital Stay (HOSPITAL_COMMUNITY): Payer: Medicare Other

## 2014-01-11 ENCOUNTER — Encounter (HOSPITAL_COMMUNITY): Payer: Self-pay | Admitting: Surgery

## 2014-01-11 DIAGNOSIS — E1129 Type 2 diabetes mellitus with other diabetic kidney complication: Secondary | ICD-10-CM

## 2014-01-11 DIAGNOSIS — N058 Unspecified nephritic syndrome with other morphologic changes: Secondary | ICD-10-CM

## 2014-01-11 DIAGNOSIS — I509 Heart failure, unspecified: Secondary | ICD-10-CM

## 2014-01-11 LAB — BASIC METABOLIC PANEL
Anion gap: 23 — ABNORMAL HIGH (ref 5–15)
BUN: 81 mg/dL — AB (ref 6–23)
CO2: 18 mEq/L — ABNORMAL LOW (ref 19–32)
CREATININE: 5.32 mg/dL — AB (ref 0.50–1.10)
Calcium: 8.1 mg/dL — ABNORMAL LOW (ref 8.4–10.5)
Chloride: 96 mEq/L (ref 96–112)
GFR, EST AFRICAN AMERICAN: 7 mL/min — AB (ref >90)
GFR, EST NON AFRICAN AMERICAN: 6 mL/min — AB (ref >90)
Glucose, Bld: 158 mg/dL — ABNORMAL HIGH (ref 70–99)
Potassium: 5.9 mEq/L — ABNORMAL HIGH (ref 3.7–5.3)
Sodium: 137 mEq/L (ref 137–147)

## 2014-01-11 LAB — CBC
HCT: 30.1 % — ABNORMAL LOW (ref 36.0–46.0)
Hemoglobin: 9.8 g/dL — ABNORMAL LOW (ref 12.0–15.0)
MCH: 32.3 pg (ref 26.0–34.0)
MCHC: 32.6 g/dL (ref 30.0–36.0)
MCV: 99.3 fL (ref 78.0–100.0)
Platelets: 183 10*3/uL (ref 150–400)
RBC: 3.03 MIL/uL — ABNORMAL LOW (ref 3.87–5.11)
RDW: 14.8 % (ref 11.5–15.5)
WBC: 16 10*3/uL — ABNORMAL HIGH (ref 4.0–10.5)

## 2014-01-11 LAB — GLUCOSE, CAPILLARY
GLUCOSE-CAPILLARY: 143 mg/dL — AB (ref 70–99)
Glucose-Capillary: 150 mg/dL — ABNORMAL HIGH (ref 70–99)

## 2014-01-11 MED ORDER — MORPHINE BOLUS VIA INFUSION
5.0000 mg | INTRAVENOUS | Status: DC | PRN
  Filled 2014-01-11: qty 20

## 2014-01-11 MED ORDER — MORPHINE SULFATE 10 MG/ML IJ SOLN
10.0000 mg/h | INTRAVENOUS | Status: DC
  Administered 2014-01-11: 2 mg/h via INTRAVENOUS
  Filled 2014-01-11: qty 10

## 2014-01-11 MED ORDER — CETYLPYRIDINIUM CHLORIDE 0.05 % MT LIQD
7.0000 mL | OROMUCOSAL | Status: DC
  Administered 2014-01-11 (×4): 7 mL via OROMUCOSAL

## 2014-01-11 MED ORDER — MORPHINE SULFATE 10 MG/ML IJ SOLN
1.0000 mg/h | INTRAMUSCULAR | Status: DC
  Filled 2014-01-11: qty 10

## 2014-01-12 ENCOUNTER — Ambulatory Visit: Payer: Medicare Other

## 2014-01-12 LAB — CULTURE, ROUTINE-ABSCESS

## 2014-01-12 LAB — CULTURE, RESPIRATORY W GRAM STAIN
Gram Stain: NONE SEEN
Special Requests: NORMAL

## 2014-01-12 LAB — CULTURE, RESPIRATORY

## 2014-01-13 NOTE — Accreditation Note (Signed)
Pursuant to regulation 482.13 (G) (3) use of restraints was logged and CMS was notified via email on January 13, 2014 at 1409 by Percell Millerarmen D. Bentlie Catanzaro, BSN, Automotive engineerN- Accreditation and Patient Safety Coordinator.

## 2014-01-18 LAB — CULTURE, BLOOD (ROUTINE X 2)
Culture: NO GROWTH
Culture: NO GROWTH

## 2014-02-09 NOTE — Procedures (Signed)
**Note De-Identified [place] Obfuscation** Extubation Procedure Note  Patient Details:   Name: Retta MacHelen E Dipinto DOB: November 11, 1918 MRN: [id number]   Airway Documentation:  Airway 7.5 mm (Active)  Secured at (cm) 21 cm 01/27/2014  1:26 PM  Measured From Lips 01/29/2014  1:26 PM  Secured Location Center 01/28/2014  1:26 PM  Secured By [employer] Tube Holder 01/15/2014  1:26 PM  Tube Holder Repositioned Yes 01/13/2014  1:26 PM  Cuff Pressure (cm H2O) 24 cm H2O 01/10/2014  7:27 AM  Site Condition Dry 01/13/2014  1:26 PM    Evaluation  O2 sats: stable throughout Complications: No apparent complications Patient did tolerate procedure well. Bilateral Breath Sounds: Clear;Diminished Suctioning: Airway No Patient extubated to comfort Tarrence Enck, Megan SalonWendy Cooper 01/18/2014, 5:21 PM

## 2014-02-09 NOTE — Significant Event (Signed)
Patient's niece requested for RN to stop levophed drip. RN turned off drip. Patient's niece also requested for staff to have patient back on monitor so they could see her vital signs. Requests honored. Family also requested for RN to increase her pain medication drip as patient started grimacing and twitching in pain. Konni Kesinger, Charity fundraiserN.

## 2014-02-09 NOTE — Progress Notes (Signed)
Had received orders to visit with patient to discus end of life issues. Went to the room to talk with family. Patient had surgery last week but has steadily declined. Patient has no family except nieces and nephews. Patient husband died some years earlier and they had no children. Offered pastoral support and care to family. Will follow up as needed.

## 2014-02-09 NOTE — Progress Notes (Signed)
Patient Name: Kimberly Norman Date of Encounter: January 28, 2014     Principal Problem:   Acute diverticulitis Active Problems:   ESRD (end stage renal disease)   DM (diabetes mellitus), type 2 with renal complications   Hyperlipidemia   Gout   HTN (hypertension)   Diverticulitis   Diverticulitis of colon   Ectopic atrial rhythm    SUBJECTIVE  Intubated  CURRENT MEDS . antiseptic oral rinse  7 mL Mouth Rinse QID  . antiseptic oral rinse  7 mL Mouth Rinse Q2H  . Chlorhexidine Gluconate Cloth  6 each Topical Daily  . darbepoetin (ARANESP) injection - DIALYSIS  40 mcg Intravenous Q Fri-HD  . fluconazole (DIFLUCAN) IV  200 mg Intravenous Q24H  . heparin  20 Units/kg Dialysis Once in dialysis  . heparin  5,000 Units Subcutaneous 3 times per day  . insulin aspart  0-9 Units Subcutaneous Q6H  . mupirocin ointment  1 application Nasal BID  . pantoprazole (PROTONIX) IV  40 mg Intravenous Daily  . piperacillin-tazobactam (ZOSYN)  IV  2.25 g Intravenous 3 times per day  . sodium chloride  3 mL Intravenous Q12H    OBJECTIVE  Filed Vitals:   2014/01/28 1100 01-28-14 1115 01/28/2014 1125 January 28, 2014 1130  BP: 131/43 96/50  115/59  Pulse: 79 80  77  Temp:   101.2 F (38.4 C)   TempSrc:   Oral   Resp: 18 23  17   Height:      Weight:      SpO2: 99% 100%  100%    Intake/Output Summary (Last 24 hours) at January 28, 2014 1213 Last data filed at 2014-01-28 1100  Gross per 24 hour  Intake 1738.22 ml  Output    180 ml  Net 1558.22 ml   Filed Weights   01/04/2014 1922 01/10/14 0500 28-Jan-2014 0540  Weight: 146 lb 9.7 oz (66.5 kg) 149 lb 7.6 oz (67.8 kg) 150 lb 5.7 oz (68.2 kg)    PHYSICAL EXAM  General: intubaed  Neuro: agitation with hand tremor with palpation HEENT:  Normal  Neck: Supple without bruits or JVD. Lungs:  Resp regular and unlabored, anterior exam CTA. Heart: RRR no s3, s4, or murmurs. Abdomen: distended with colostomy bag in placed Extremities: No clubbing, cyanosis or edema.  DP/PT/Radials 2+ and equal bilaterally.  Accessory Clinical Findings  CBC  Recent Labs  01/10/14 0400 January 28, 2014 0430  WBC 12.2* 16.0*  HGB 10.0* 9.8*  HCT 31.2* 30.1*  MCV 98.4 99.3  PLT 291 183   Basic Metabolic Panel  Recent Labs  01/10/14 0400 2014-01-28 0430  NA 139 137  K 5.3 5.9*  CL 96 96  CO2 21 18*  GLUCOSE 157* 158*  BUN 53* 81*  CREATININE 3.95* 5.32*  CALCIUM 8.6 8.1*   Liver Function Tests  Recent Labs  01/25/2014 2300  AST 24  ALT 12  ALKPHOS 71  BILITOT 0.6  PROT 6.7  ALBUMIN 2.1*   Cardiac Enzymes  Recent Labs  01/30/2014 2300 01/10/14 0400 01/10/14 0956  TROPONINI 3.49* 4.02* 4.59*   Thyroid Function Tests  Recent Labs  12/22/2013 1547  TSH 1.020    TELE  NSR with HR 70-80s, occasional irregular rhythm however still appears to be sinus with p waves  ECG  Sinus tach with HR 100s, ST dowsloping in lateral leads  Radiology/Studies  Ct Abdomen Pelvis W Contrast  12/23/2013   CLINICAL DATA:  Lower abdominal pain and tightness without nausea or vomiting; dialysis dependent renal failure, hysterectomy, diabetes,  and CHF  EXAM: CT ABDOMEN AND PELVIS WITH CONTRAST  TECHNIQUE: Multidetector CT imaging of the abdomen and pelvis was performed using the standard protocol following bolus administration of intravenous contrast.  CONTRAST:  OMNIPAQUE IOHEXOL 300 MG/ML SOLN intravenously. The patient also received oral contrast material.  COMPARISON:  None.  FINDINGS: The mid and distal sigmoid colon exhibit abnormal wall thickening with surrounding inflammatory changes. There is perisigmoid fluid. A tiny gas collection is demonstrated on image 51 of series 2. This is extraluminal. There is a moderate amount of stool in the sigmoid colon. More proximally the colonic stool burden is less. There is no small bowel obstruction.  The gallbladder is adequately distended and exhibits no calcified stones. The liver, adrenal glands, pancreas, and stomach are  unremarkable. There are innumerable calcifications within the normal sized spleen. The abdominal aorta exhibits mural calcification but no aneurysm. The kidneys are atrophic. There is a cysts associated with the lower pole of the left kidney measuring 3.5 cm in diameter.  There are emphysematous changes at the lung bases. There are fibrotic changes versus atelectasis posteriorly in both lower lobes. The cardiac chambers are enlarged. There are degenerative changes of the lumbar spine and of both hips.  IMPRESSION: 1. Acute mid sigmoid diverticulitis with findings worrisome for at least micro perforation. No discrete drainable abscess is demonstrated. 2. There is no acute bowel abnormality elsewhere. 3. There is no acute hepatobiliary abnormality. 4. These results were called by telephone at the time of interpretation on 12/12/2013 at 12:49 pm to Dr. Wynetta Emery , who verbally acknowledged these results.   Electronically Signed   By: David  [place]   On: 01/01/2014 12:49   Dg Chest Port 1 View  26-Jan-2014   CLINICAL DATA:  Pneumonitis  EXAM: PORTABLE CHEST - 1 VIEW  COMPARISON:  01/29/2014  FINDINGS: The endotracheal tube is again seen 3.6 cm above the carina and within normal limits. A nasogastric catheter extends into the stomach. A the right-sided central venous line is again seen and stable. Cardiac shadow remains enlarged. The lungs are again well aerated with mild vascular congestion. No focal infiltrate or sizable effusion is seen.  IMPRESSION: Stable appearance of the chest.   Electronically Signed   By: Alcide Clever M.D.   On: 01-26-2014 07:12   Dg Chest Port 1 View  01/10/2014   CLINICAL DATA:  Central line placement  EXAM: PORTABLE CHEST - 1 VIEW  COMPARISON:  02/07/2014  FINDINGS: Interval placement of a right central venous catheter. Tip overlies the cavoatrial junction. No pneumothorax. Endotracheal tube tip measures 4.6 cm above the carinal. Enteric tube tip is off the field of view but below  the left hemidiaphragm. Shallow inspiration. Cardiac enlargement with mild pulmonary vascular congestion. Interstitial changes in the lung bases may represent early edema or atelectasis. No blunting of costophrenic angles.  IMPRESSION: Appliances appear in satisfactory position. Right central venous catheter tip overlies the cavoatrial junction. No pneumothorax. Persistent cardiac enlargement with developing vascular congestion and possible early interstitial edema versus atelectasis.   Electronically Signed   By: Burman Nieves M.D.   On: 01/10/2014 02:39   Dg Chest Port 1 View  01/31/2014   CLINICAL DATA:  Intubated  EXAM: PORTABLE CHEST - 1 VIEW  COMPARISON:  08/04/2013  FINDINGS: The heart size is at upper limits of normal. Lungs are hypoaerated with crowding of the bronchovascular markings. Both lungs are clear. The visualized skeletal structures are unremarkable. Nasogastric tube tip terminates below the  level of the hemidiaphragms but is not included in the field of view. Endotracheal tube tip is positioned 2.7 cm above the carina.  IMPRESSION: Support apparatus as above. Cardiomegaly without focal acute finding.   Electronically Signed   By: Christiana PellantGretchen  Green M.D.   On: 01/19/2014 15:24   Dg Abd Portable 1v  01/12/2014   CLINICAL DATA:  Two week history of constipation now with 1 hr history of right lower quadrant pain.  EXAM: PORTABLE ABDOMEN - 1 VIEW  COMPARISON:  CT scan abdomen and pelvis 12/17/2013  FINDINGS: Free intraperitoneal air are in the left lower quadrant as identified by the double wall sign of the sigmoid colon. This is in the region of perforated diverticulitis seen on the prior CT imaging. Old granulomatous disease throughout the spleen. Multilevel degenerative disc disease. Moderate colonic stool burden consistent with constipation. No evidence of bowel obstruction.  IMPRESSION: Focal free air in the left lower quadrant in the region of the perforated diverticulitis.  Persistent  moderate colonic stool burden consistent with constipation.  No evidence of developing bowel obstruction.  These results were called by telephone at the time of interpretation on 01/28/2014 at 9:43 am to Dr. Luretha MurphyMatthew Martin who verbally acknowledged these results.   Electronically Signed   By: Malachy MoanHeath  McCullough M.D.   On: 01/13/2014 09:19    ASSESSMENT AND PLAN  Unfortunate 80 yo female with acute diverticulitis and ruptured sigmoid colon s/p sigmoid colectomy with end colosctomy 8/1  1. Non-STEMI likely due to hypotension, severe anemia and stress of surgery she does have underlying risk factors but is not a candidate for cardiac intervention with cath at this time.   - prognosis remain poor, PCCC discussed with family, pending withdraw of care after family arrive today  2. Hypotension with Septic shock on norepi  - WBC increased from 12 to 16  - febrile overnight with Tmax 101.2  - no BP med 3. End-stage renal disease on HD: per nephro, originally planned for HD today for potassium and acidosis, no volume off 4. History of ectopic atrial rhythm  5. Anemia 4. Continue to hypotension   Signed, Azalee CourseMeng, Hao PA-C Pager: [phone] Agree with above assessment. Patient sedated on vent. Rhythm NSR with frequent PACs.  BP being supported with levophed drip. The plan is to withdraw care after family arrives. Will sign off.

## 2014-02-09 NOTE — Significant Event (Addendum)
Wasted approximately 150cc of fentanyl drip into sink and flushed. Wasted this with Allegra GranaShanna Shintz, RN.    150 cc of fentanyl wasted in sink with Hyiu Ksor.   Tommi EmeryHINTZ, Caelen Reierson M

## 2014-02-09 NOTE — Progress Notes (Addendum)
[facility] Progress Note   Subjective: Remains intubated and sedated On pressors BP sl better when sedation lightened Due for HD today BP currently 120 systolic though LE with some mottling  Filed Vitals:   02/08/2014 0945 01/20/2014 1000 02/08/2014 1015 01/23/2014 1030  BP: 99/64 94/71 118/63   Pulse: 75 80 76 80  Temp:      TempSrc:      Resp: 19 19 18 19   Height:      Weight:      SpO2: 100% 100% 95% 100%   Exam: Sedated on vent NO JVD Chest is clear anterirly RRR no MRG Abd very obese , dressings in place mid abd with dark bloody drainage in ostomy bag No LE or UE edema; soles of feet with some mottling Neuro is sedated on vent; some twitching that increases with stimulation R arm AVF +bruit     Recent Labs Lab 02/07/2014 2300 01/10/14 0400 01/30/2014 0430  NA 137 139 137  K 4.6 5.3 5.9*  CL 94* 96 96  CO2 19 21 18*  GLUCOSE 144* 157* 158*  BUN 47* 53* 81*  CREATININE 3.47* 3.95* 5.32*  CALCIUM 8.4 8.6 8.1*    Recent Labs Lab 01/07/2014 1001 01/19/2014 2300  AST 12 24  ALT 7 12  ALKPHOS 77 71  BILITOT 0.8 0.6  PROT 7.4 6.7  ALBUMIN 2.8* 2.1*    Recent Labs Lab 12/23/2013 1001  02/05/2014 1610 01/10/14 0400 01/28/2014 0430  WBC 13.3*  < > 6.4 12.2* 16.0*  NEUTROABS 12.2*  --   --   --   --   HGB 10.2*  < > 10.6* 10.0* 9.8*  HCT 31.2*  < > 32.9* 31.2* 30.1*  MCV 97.5  < > 97.9 98.4 99.3  PLT 235  < > 236 291 183  < > = values in this interval not displayed.  Medications . antiseptic oral rinse  7 mL Mouth Rinse QID  . antiseptic oral rinse  7 mL Mouth Rinse Q2H  . Chlorhexidine Gluconate Cloth  6 each Topical Daily  . darbepoetin (ARANESP) injection - DIALYSIS  40 mcg Intravenous Q Fri-HD  . fluconazole (DIFLUCAN) IV  200 mg Intravenous Q24H  . heparin  20 Units/kg Dialysis Once in dialysis  . heparin  5,000 Units Subcutaneous 3 times per day  . insulin aspart  0-9 Units Subcutaneous Q6H  . mupirocin ointment  1 application Nasal BID   . pantoprazole (PROTONIX) IV  40 mg Intravenous Daily  . piperacillin-tazobactam (ZOSYN)  IV  2.25 g Intravenous 3 times per day  . sodium chloride  3 mL Intravenous Q12H   . sodium chloride 10 mL/hr at 01/10/14 1700  . fentaNYL infusion INTRAVENOUS 25 mcg/hr (01/18/2014 1000)  . norepinephrine (LEVOPHED) Adult infusion 28 mcg/min (01/20/2014 1039)   sodium chloride, acetaminophen, fentaNYL, fentaNYL, haloperidol lactate, heparin, hydrALAZINE, hydrOXYzine, lidocaine (PF), lidocaine-prilocaine, LORazepam, metoprolol, ondansetron (ZOFRAN) IV, pentafluoroprop-tetrafluoroeth   HD: MWF NW 4h   66kg   2/2.0 Bath   Heparin 5000   R AVF   350/A1.5 16 gauge needles ARanesp 40 ug q week, Venofer 50 mg q week       Assessment: 1 Acute diverticulitis / ruptured sigmoid colon/peritonitis - s/p sigmoid colectomy with end-colostomy 8/1; antibiotics 2 Septic/cardiogenic shock on pressors (levophed) 3 ESRD on HD - will attempt to HD today primarily for potassium and acidosis with no volume off. She may not tolerate this.  I am somewhat hesitant to launch into CRRT  with her, given her somewhat poor prognosis. If we cannot HD may in context of severity of current illness want to consider palliation 4 HTN at baseline - now hypotension d/t shock  5 Anemia cont aranesp 40 QFriday 6 HPTH cont meds if/when eating again 7 DM per primary  8 Ischemic CM/NSTEMI not candidate for intervention  Camille Balynthia Able Malloy, MD [facility] [phone] Pager 01/29/2014, 10:51 AM

## 2014-02-09 NOTE — Significant Event (Signed)
Bilateral wrist restraints have been removed at 1700 prior to extubation. Restraints not needed and not necessary at this time.

## 2014-02-09 NOTE — Progress Notes (Signed)
Plan for withdrawal of support after arrival of family noted. I agree with comfort measures in this setting.  No further dialysis.   Thanks for allowing Kimberly Norman to participate in her care. [name], MD [facility] [phone] Pager 01/18/2014, 1:27 PM

## 2014-02-09 NOTE — Significant Event (Addendum)
Pt is without respirations, no audible heart tones, asystole on monitor; time of death 2208, verified by 2 RNs, Holland Fallingebecca Sarine RN and Alyce PaganAllison Glen Blatchley RN; CCM doctor notified; [facility] called

## 2014-02-09 NOTE — Progress Notes (Addendum)
25ml of Morphine gtt wasted down sink, witnessed by second RN, Burna CashEric Ermelinda Eckert.

## 2014-02-09 NOTE — Significant Event (Signed)
Patient's niece and other family members at the bedside. Niece stated they are ready for extubation to comfort. MD at [facility] notified. Patient extubated without any issues to nasal cannula.

## 2014-02-09 NOTE — Progress Notes (Signed)
[facility] Progress Note  2 Days Post-Op  Subjective: Pt on vent, on pressors, not alert, does not follow commands, appears to be in pain during my exam of her abdomen  Objective: Vital signs in last 24 hours: Temp:  [96 F (35.6 C)-103 F (39.4 C)] 102.4 F (39.1 C) (08/03 0723) Pulse Rate:  [33-187] 75 (08/03 0833) Resp:  [9-29] 20 (08/03 0833) BP: (81-161)/(21-116) 113/45 mmHg (08/03 0833) SpO2:  [96 %-100 %] 100 % (08/03 0830) FiO2 (%):  [30 %] 30 % (08/03 0833) Weight:  [150 lb 5.7 oz (68.2 kg)] 150 lb 5.7 oz (68.2 kg) (08/03 0540) Last BM Date: 01/04/14  Intake/Output from previous day: 08/02 0701 - 08/03 0700 In: 2259.1 [I.V.:1759.1; NG/GT:150; IV Piggyback:350] Out: 180 [Emesis/NG output:150; Drains:30] Intake/Output this shift: Total I/O In: 50.9 [I.V.:50.9] Out: -   PE: Gen:  Intubated critically ill on pressors Card:  Regular rate, very soft heart sounds, no Murmurs heard Pulm:  On vent, CTA anteriorly, no W/R/R heard Abd: Soft, distended, appears diffusely tender, diminished BS, NG output 15450mL/24hr, midline incision clean, ostomy black/edematous with sanguinous drainage  Ext:  No erythema, edema, or tenderness   Lab Results:   Recent Labs  01/10/14 0400 01/28/2014 0430  WBC 12.2* 16.0*  HGB 10.0* 9.8*  HCT 31.2* 30.1*  PLT 291 183   BMET  Recent Labs  01/10/14 0400 01/12/2014 0430  NA 139 137  K 5.3 5.9*  CL 96 96  CO2 21 18*  GLUCOSE 157* 158*  BUN 53* 81*  CREATININE 3.95* 5.32*  CALCIUM 8.6 8.1*   PT/INR No results found for this basename: LABPROT, INR,  in the last 72 hours CMP     Component Value Date/Time   NA 137 01/12/2014 0430   K 5.9* 01/10/2014 0430   CL 96 02/03/2014 0430   CO2 18* 02/07/2014 0430   GLUCOSE 158* 01/28/2014 0430   BUN 81* 02/03/2014 0430   CREATININE 5.32* 02/06/2014 0430   CALCIUM 8.1* 01/26/2014 0430   PROT 6.7 01/16/2014 2300   ALBUMIN 2.1* 01/18/2014 2300   AST 24 01/15/2014 2300   ALT 12 02/06/2014 2300   ALKPHOS 71 01/18/2014 2300   BILITOT 0.6 01/25/2014 2300   GFRNONAA 6* 01/21/2014 0430   GFRAA 7* 01/21/2014 0430   Lipase  No results found for this basename: lipase      Studies/Results: Dg Chest Port 1 View  01/30/2014   CLINICAL DATA:  Pneumonitis  EXAM: PORTABLE CHEST - 1 VIEW  COMPARISON:  02/02/2014  FINDINGS: The endotracheal tube is again seen 3.6 cm above the carina and within normal limits. A nasogastric catheter extends into the stomach. A the right-sided central venous line is again seen and stable. Cardiac shadow remains enlarged. The lungs are again well aerated with mild vascular congestion. No focal infiltrate or sizable effusion is seen.  IMPRESSION: Stable appearance of the chest.   Electronically Signed   By: Alcide CleverMark  Lukens M.D.   On: 02/07/2014 07:12   Dg Chest Port 1 View  01/10/2014   CLINICAL DATA:  Central line placement  EXAM: PORTABLE CHEST - 1 VIEW  COMPARISON:  02/05/2014  FINDINGS: Interval placement of a right central venous catheter. Tip overlies the cavoatrial junction. No pneumothorax. Endotracheal tube tip measures 4.6 cm above the carinal. Enteric tube tip is off the field of view but below the left hemidiaphragm. Shallow inspiration. Cardiac enlargement with mild pulmonary vascular congestion. Interstitial changes in the lung bases may represent  early edema or atelectasis. No blunting of costophrenic angles.  IMPRESSION: Appliances appear in satisfactory position. Right central venous catheter tip overlies the cavoatrial junction. No pneumothorax. Persistent cardiac enlargement with developing vascular congestion and possible early interstitial edema versus atelectasis.   Electronically Signed   By: Burman Nieves M.D.   On: 01/10/2014 02:39   Dg Chest Port 1 View  Jan 16, 2014   CLINICAL DATA:  Intubated  EXAM: PORTABLE CHEST - 1 VIEW  COMPARISON:  08/04/2013  FINDINGS: The heart size is at upper limits of normal. Lungs are hypoaerated with crowding of the bronchovascular  markings. Both lungs are clear. The visualized skeletal structures are unremarkable. Nasogastric tube tip terminates below the level of the hemidiaphragms but is not included in the field of view. Endotracheal tube tip is positioned 2.7 cm above the carina.  IMPRESSION: Support apparatus as above. Cardiomegaly without focal acute finding.   Electronically Signed   By: Christiana Pellant M.D.   On: 01/16/2014 15:24   Dg Abd Portable 1v  2014-01-16   CLINICAL DATA:  Two week history of constipation now with 1 hr history of right lower quadrant pain.  EXAM: PORTABLE ABDOMEN - 1 VIEW  COMPARISON:  CT scan abdomen and pelvis 12/10/2013  FINDINGS: Free intraperitoneal air are in the left lower quadrant as identified by the double wall sign of the sigmoid colon. This is in the region of perforated diverticulitis seen on the prior CT imaging. Old granulomatous disease throughout the spleen. Multilevel degenerative disc disease. Moderate colonic stool burden consistent with constipation. No evidence of bowel obstruction.  IMPRESSION: Focal free air in the left lower quadrant in the region of the perforated diverticulitis.  Persistent moderate colonic stool burden consistent with constipation.  No evidence of developing bowel obstruction.  These results were called by telephone at the time of interpretation on January 16, 2014 at 9:43 am to Dr. Luretha Murphy who verbally acknowledged these results.   Electronically Signed   By: Malachy Moan M.D.   On: 16-Jan-2014 09:19    Anti-infectives: Anti-infectives   Start     Dose/Rate Route Frequency Ordered Stop   2014/01/16 1800  fluconazole (DIFLUCAN) IVPB 200 mg     200 mg 100 mL/hr over 60 Minutes Intravenous Every 24 hours 01-16-14 1509     12/23/2013 1330  piperacillin-tazobactam (ZOSYN) IVPB 2.25 g     2.25 g 100 mL/hr over 30 Minutes Intravenous 3 times per day 01/07/2014 1303         Assessment/Plan Acute sigmoid diverticulitis with perforation POD #2 s/p Sigmoid  colectomy with end colostomy (Hartmann's) Non-STEMI Leukocytosis 16.0 Fevers - Tmax 102.4*F ESRD on dialysis - Cr 5.32  Plan:  1. NPO, bowel rest, IVF, pain control, antiemetics, antibiotics (Zosyn Day #3)  2. Await improvement in medical problems and improvement in bowel function prior to contemplating removing NG tube and starting diet if she improves 3.  She is not a candidate for cardiac intervention at this time per cards 4.  On vent, likely poor prognosis 5.  Appreciate cards, nephrology, and CCM's management of the patient 6.  May need to consult Palliative care medicine to discuss goals of care 7.  Repeat labs in AM    LOS: 3 days    DORT, Mardy Lucier 01/20/2014, 8:41 AM Pager: [phone]

## 2014-02-09 NOTE — Significant Event (Deleted)
Wasted approximately 150cc of fentanyl

## 2014-02-09 NOTE — Progress Notes (Signed)
UR completed. Greer Koeppen RN CCM Case Mgmt phone [phone] 

## 2014-02-09 NOTE — Significant Event (Signed)
Patient is to be transition to comfort care only at this time. Clarified with Dr. Hazle QuantMcQuid regarding current orders. Per MD, to keep the following orders at this time until family arrives this evening: to keep patient on ventilator, start on morphine drip, keep levophed at current rate, and to keep patient comfortable only. Will follow through with these orders.

## 2014-02-09 NOTE — Progress Notes (Signed)
PULMONARY / CRITICAL CARE MEDICINE   Name: Kimberly Norman MRN: [id number] DOB: 1919-04-19    ADMISSION DATE:  12/20/2013 CONSULTATION DATE:  01/20/2014  REFERRING MD :  Magnus IvanBlackman  CHIEF COMPLAINT:  Post  management  INITIAL PRESENTATION: 80 y.o woman with DM-2,  ischemic cardiomyopathy, EF 25%, ESRD on HD, presented 7/31 with perforated sigmoid diverticulitis requiring emergent Sigmoid colectomy with end-colostomy . PCCM consulted post op    STUDIES:  CT abd 7/31 >>Acute mid sigmoid diverticulitis with findings worrisome for at  least micro perforation  SIGNIFICANT EVENTS: 8/1 Sigmoid colectomy with end-colostomy 8/1 pressors, pos trops  SUBJECTIVE: Currently on high dose levophed, febrile, appears in pain  VITAL SIGNS: Temp:  [96 F (35.6 C)-103 F (39.4 C)] 102.4 F (39.1 C) (08/03 0723) Pulse Rate:  [33-187] 80 (08/03 1030) Resp:  [9-29] 19 (08/03 1030) BP: (81-161)/(21-116) 118/63 mmHg (08/03 1015) SpO2:  [95 %-100 %] 100 % (08/03 1030) FiO2 (%):  [30 %] 30 % (08/03 1030) Weight:  [68.2 kg (150 lb 5.7 oz)] 68.2 kg (150 lb 5.7 oz) (08/03 0540) HEMODYNAMICS: CVP:  [9 mmHg-17 mmHg] 11 mmHg VENTILATOR SETTINGS: Vent Mode:  [-] PRVC FiO2 (%):  [30 %] 30 % Set Rate:  [14 bmp] 14 bmp Vt Set:  [500 mL] 500 mL PEEP:  [5 cmH20] 5 cmH20 Plateau Pressure:  [13 cmH20-20 cmH20] 19 cmH20 INTAKE / OUTPUT:  Intake/Output Summary (Last 24 hours) at 01/28/2014 1105 Last data filed at 01/28/2014 1000  Gross per 24 hour  Intake 1894.35 ml  Output    180 ml  Net 1714.35 ml    PHYSICAL EXAMINATION: Gen. Acutely ill elderly female ENT -ETT in place Lungs: coarse breath sounds bilaterally  Cardiovascular: RRR, S1/S2 Abdomen:  distended,  colostomy withbrown stool, JP with cloudy drainage Neuro:  sedated, winces occasionally, suspect she is in pain Skin:  Warm, scattered bruises, thin skin   LABS:  CBC  Recent Labs Lab 2014/05/13 1610 01/10/14 0400 02/06/2014 0430  WBC 6.4  12.2* 16.0*  HGB 10.6* 10.0* 9.8*  HCT 32.9* 31.2* 30.1*  PLT 236 291 183   Coag's No results found for this basename: APTT, INR,  in the last 168 hours BMET  Recent Labs Lab 2014/05/13 2300 01/10/14 0400 01/31/2014 0430  NA 137 139 137  K 4.6 5.3 5.9*  CL 94* 96 96  CO2 19 21 18*  BUN 47* 53* 81*  CREATININE 3.47* 3.95* 5.32*  GLUCOSE 144* 157* 158*   Electrolytes  Recent Labs Lab 2014/05/13 2300 01/10/14 0400 02/02/2014 0430  CALCIUM 8.4 8.6 8.1*   Sepsis Markers  Recent Labs Lab 12/29/2013 0946 2014/05/13 2300  LATICACIDVEN 2.2 1.8   ABG  Recent Labs Lab 2014/05/13 1515 2014/05/13 2321  PHART 7.515* 7.394  PCO2ART 25.0* 34.8*  PO2ART 234.0* 86.0   Liver Enzymes  Recent Labs Lab 01/06/2014 1001 2014/05/13 2300  AST 12 24  ALT 7 12  ALKPHOS 77 71  BILITOT 0.8 0.6  ALBUMIN 2.8* 2.1*   Cardiac Enzymes  Recent Labs Lab 2014/05/13 2300 01/10/14 0400 01/10/14 0956  TROPONINI 3.49* 4.02* 4.59*   Glucose  Recent Labs Lab 01/10/14 0017 01/10/14 0610 01/10/14 1200 01/10/14 1746 01/10/14 2348 01/10/2014 0601  GLUCAP 158* 145* 188* 170* 181* 143*    Imaging No results found.   ASSESSMENT / PLAN:  PULMONARY OETT8/1 >> A:Acute resp failure P:   Vent bundle Worsening shock precludes extubation  CARDIOVASCULAR  A:  Ischemic cardiomyopathy - 25% NSTEMI > troponin  uptrending Septic and cardiogenic shock > worse 8/3 P:  Levophed for MAP > 65   RENAL A:   ESRD Hyperkalemia P:   HD per renal > will attempt today Worsening metabolic acidosis  GASTROINTESTINAL A:  Colon Perforation colostomy P:   Npo for now Wound care per surgery  HEMATOLOGIC A:  Leucocytosis P:  monitor  INFECTIOUS A:  Peritonitis BCx2 8/1 >> Abscess 8/1 >> e coli P:   Abx: zosyn, start date7/31>> Diflucan 8/1 >>  ENDOCRINE A:  DM-2   P:   SSI  NEUROLOGIC A:  Post op pain P:   RASS goal: 0 to -1 Fent gtt, versed prn  TODAY'S SUMMARY: Prognosis grim  as worsening sepsis, multi-organ failure in setting of advanced age and several severe chronic medical comorbid illnesses.  Will discuss palliation with niece today.  I have personally obtained a history, examined the patient, evaluated laboratory and imaging results, formulated the assessment and plan and placed orders. CRITICAL CARE: The patient is critically ill with multiple organ systems failure and requires high complexity decision making for assessment and support, frequent evaluation and titration of therapies, application of advanced monitoring technologies and extensive interpretation of multiple databases. Critical Care Time devoted to patient care services described in this note is 35 minutes.   Heber Kilmarnock, MD [place] [facility] Pager: [phone] Cell: [phone] If no response, call [phone]    01/18/2014, 11:05 AM

## 2014-02-09 NOTE — Progress Notes (Signed)
CARE MANAGEMENT NOTE 01/12/2014  Patient:  Kimberly Norman,Kimberly Norman   Account Number:  [id number]  Date Initiated:  01/22/2014  Documentation initiated by:  [facility]Malak Duchesneau  Subjective/Objective Assessment:   Sigmoid colectomy with end-colostomy 01/18/2014     Action/Plan:   SNF    Anticipated DC Date:     Anticipated DC Plan:        DC Planning Services  CM consult      Choice offered to / List presented to:             Status of service:  In process, will continue to follow Medicare Important Message given?  YES (If response is "NO", the following Medicare IM given date fields will be blank) Date Medicare IM given:  01/28/2014 Medicare IM given by:  [facility]icit[facility]ine Date Additional Medicare IM given:   Additional Medicare IM given by:    Discharge Disposition:    Per UR Regulation:    If discussed at Long Length of Stay Meetings, dates discussed:    Comments:  01/24/2014 1145 Levophed gtt, vent, temp 101. NCM will continue to follow for dc needs.  Isidoro DonningAlesia Toluwani Yadav RN CCM Case Mgmt phone [phone]

## 2014-02-09 NOTE — Progress Notes (Signed)
[facility] Physician-Brief Progress Note Patient Name: Kimberly Norman DOB: 01-Aug-1918 MRN: [id number]  Date of Service  01/19/2014   HPI/Events of Note   Family at bedside  eICU Interventions  Withdrawal of life support -orders given   Intervention Category Major Interventions: End of life / care limitation discussion  Norvell Ureste V. 01/24/2014, 5:07 PM

## 2014-02-09 NOTE — Progress Notes (Signed)
[facility] PCCM  Discussed case with the patient's niece, HCPOA.  I expressed my concern that we were providing non-medically beneficial care.  I advised that we stop current management and she agreed.  Plan to withdraw care after family arrives today.  Heber CarolinaBrent Alsie Younes, MD [place] [facility] Pager: [phone] Cell: [phone] If no response, call [phone]

## 2014-02-09 NOTE — Progress Notes (Signed)
Chaplain responded to spiritual care consult for end of life support. Pt's family was not present. Consulted with RN who said family plans to withdraw later today when everyone is gathered. Requested RN to page chaplains at family's convenience for spiritual and grief support. Chaplain available 24/7.   Maurene CapesHillary D Irusta [phone]

## 2014-02-09 NOTE — Progress Notes (Signed)
Pt seen this evening around 7 pm Exam deferred Pt has been made comfort care  Had discussed with PA earlier finding of ostomy Agree with switch to comfort care Pt's family at bedside Offered support.  Questions answered.   Kimberly SellaEric M. Andrey CampanileWilson, MD, FACS General, Bariatric, & Minimally Invasive Surgery [facility] [place]

## 2014-02-09 NOTE — Significant Event (Signed)
Referral to [facility] made at 1932, referral number [id number], [facility], who stated to call back with time of death. Per Ms. Hannold, patient not suitable for donations r/t age. Reported off to nightshift. Savannah Erbe, Charity fundraiserN.

## 2014-02-09 DEATH — deceased

## 2014-03-06 NOTE — Discharge Summary (Signed)
PULMONARY / CRITICAL CARE MEDICINE DEATH NOTE   Name: Kimberly Norman MRN: [id number] DOB: [birth year]    ADMISSION DATE:  12/18/2013 CONSULTATION DATE:  2014/03/29  Cause of Death:  1)Septic shock  2) Diverticulitis 3) Decompensated heart failure 4) ESRD  CHIEF COMPLAINT:  Post  management  INITIAL PRESENTATION: 80 y.o woman with DM-2,  ischemic cardiomyopathy, EF 25%, ESRD on HD, presented 7/31 with perforated sigmoid diverticulitis requiring emergent Sigmoid colectomy with end-colostomy . PCCM consulted post op.  See admission H&P for more details  PMH/Family Hx/Social history/Allergies/Home meds: see admission H&P  No intake or output data in the 24 hours ending 03/29/2014 0311  PHYSICAL EXAMINATION: Gen. Acutely ill elderly female ENT -ETT in place Lungs: coarse breath sounds bilaterally  Cardiovascular: RRR, S1/S2 Abdomen:  distended,  colostomy withbrown stool, JP with cloudy drainage Neuro:  sedated, winces occasionally, suspect she is in pain Skin:  Warm, scattered bruises, thin skin  STUDIES:  CT abd 7/31 >>Acute mid sigmoid diverticulitis with findings worrisome for at  least micro perforation  SIGNIFICANT EVENTS: 8/1 Sigmoid colectomy with end-colostomy 8/1 pressors, pos trops  Hospital course: The patient was admitted on 7/31 for severe sepsis related to diverticulitis and underwent a sigmoid colectomy and end-colostomy on 8/1.  Post operatively she was noted to be in persistent shock from sepsis. She also had evidence of an NSTEMI as well as volume overload representing acute decompensated heart failure again related to the septic shock.  She was unable to be quickly weaned from pressors, so considering her advanced age and multiple, severe, chronic comorbid illnesses we discussed code status with her niece.  We recommended halting further aggressive care and focusing on comfort.  She agreed and the patient was transitioned to a comfort focused medication regimen and  she passed peacefully on February 03, 2014.  Heber Wetumka, MD [place] [facility] Pager: [phone] Cell: [phone] If no response, call [phone]    2014/03/29, 3:11 AM

## 2014-03-29 IMAGING — CR DG CHEST 2V
2 series · 2 of 2 positions shown · non-contrast
Comparison: 06/12/2012

CLINICAL DATA: Short of breath

CHEST - 2 VIEW

[x chest ap]
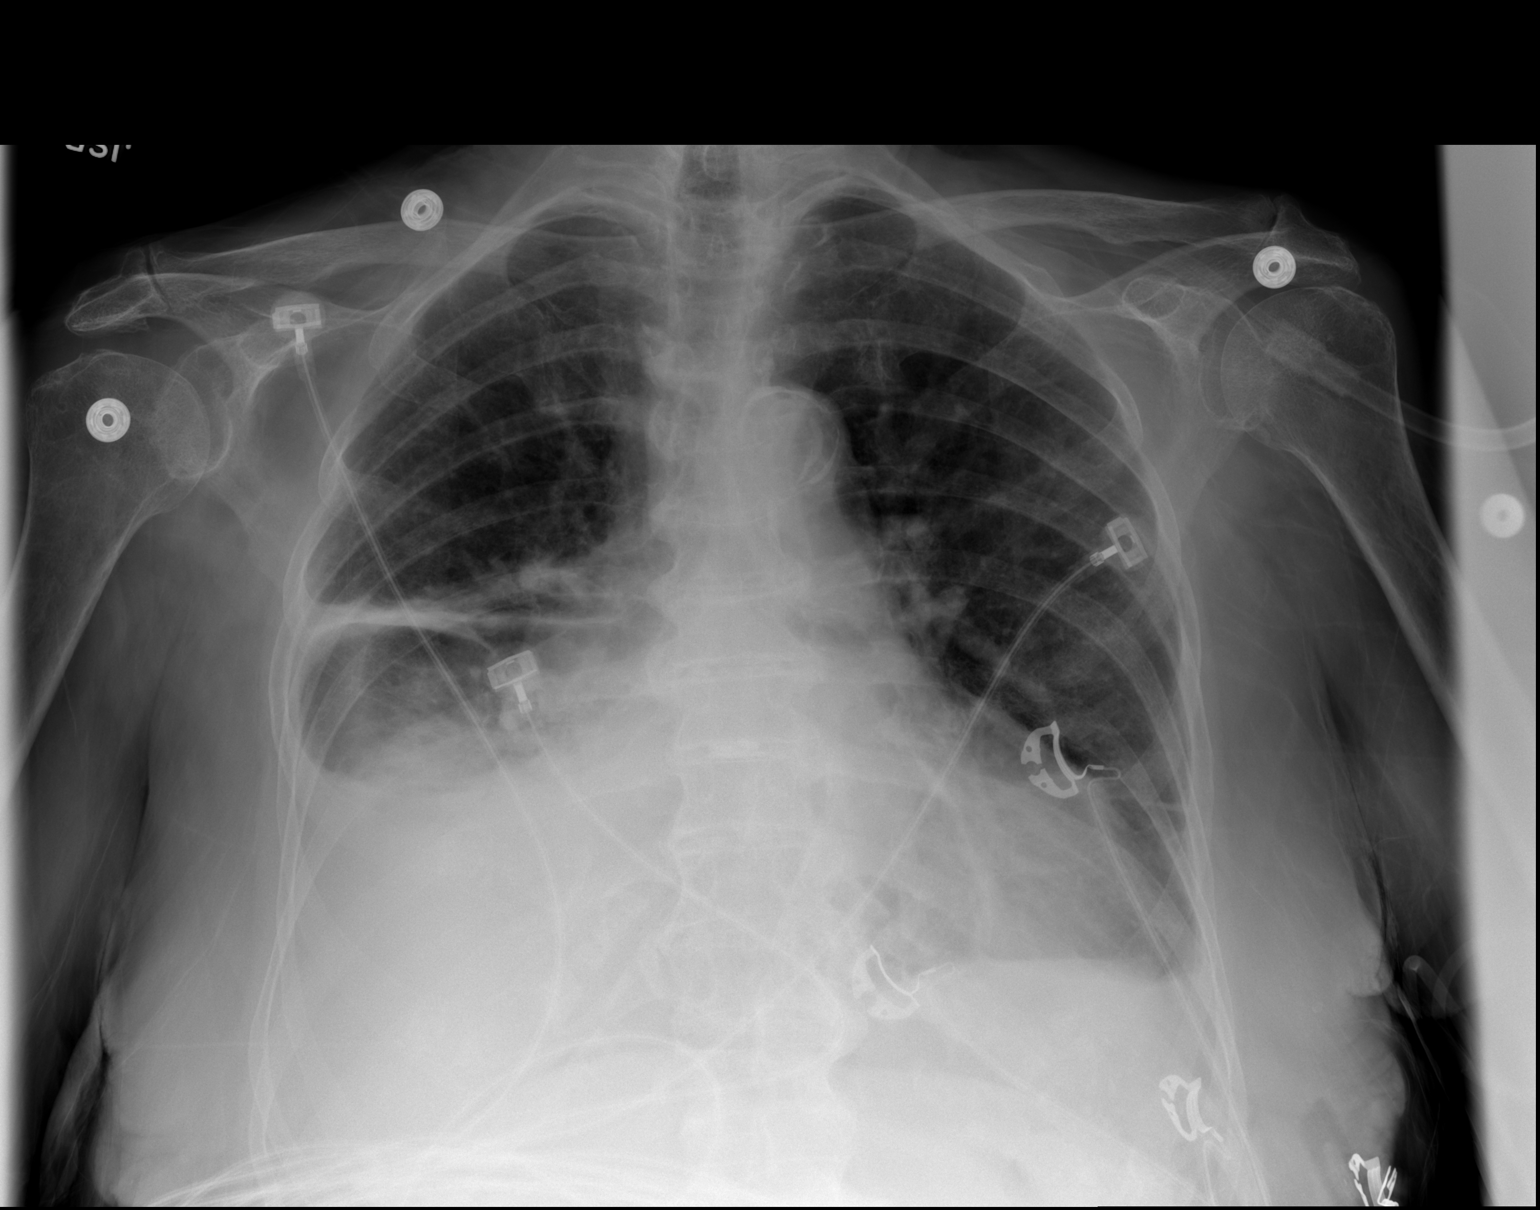

[w chest lat]
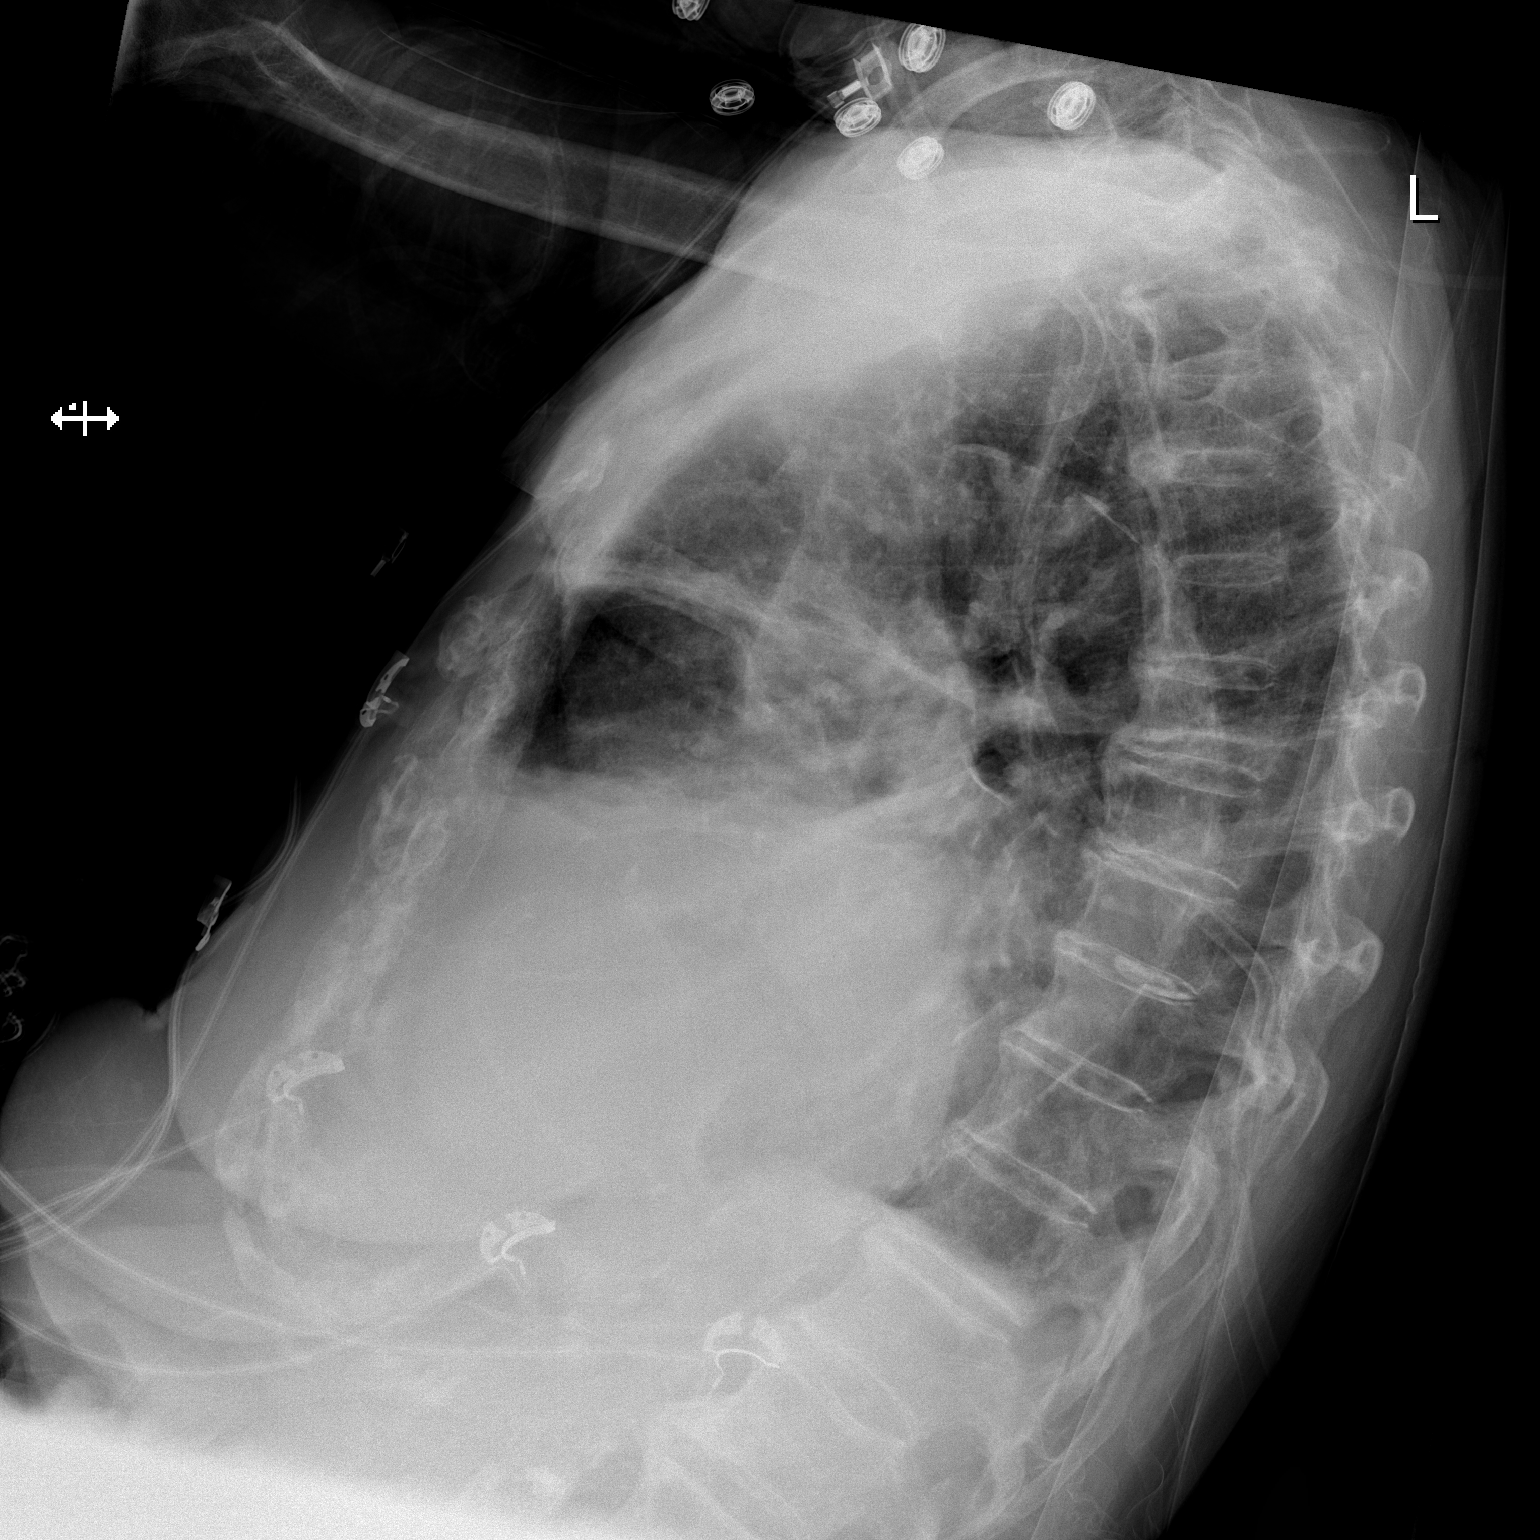

[2 of 2 positions shown; findings below may reference images not displayed]

FINDINGS: Mild cardiac enlargement.  Stable appearance of the right
pleural effusion with overlying atelectasis/consolidation.  Smaller
left effusion appears similar to previous exam.  Mild multilevel
spondylosis is identified within the thoracic spine.
IMPRESSION: 1.  No change from previous exam.
2.  Persistent moderate right pleural effusion and smaller left
effusion.

## 2014-04-01 IMAGING — US IR FLUORO GUIDE CV LINE*R*
1 series · 2 of 2 positions shown · non-contrast
Comparison: none

INDICATION: [AGE] with end-stage renal disease and needs
hemodialysis.

[Series 1: ir fluoro guide cv line*right* · 2 of 2 slices shown]
[im 1/2]
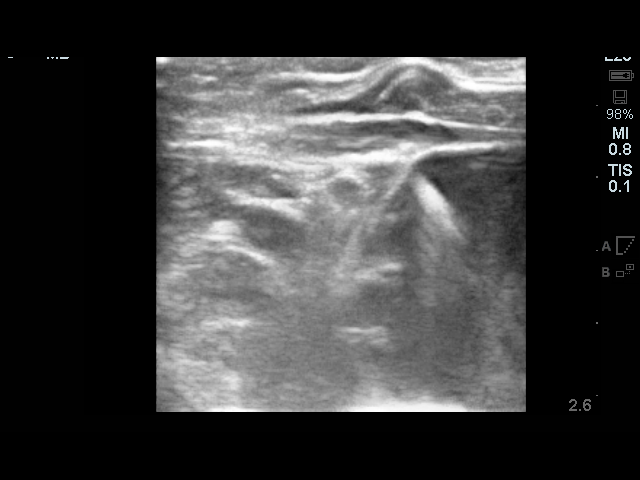
[im 2/2]
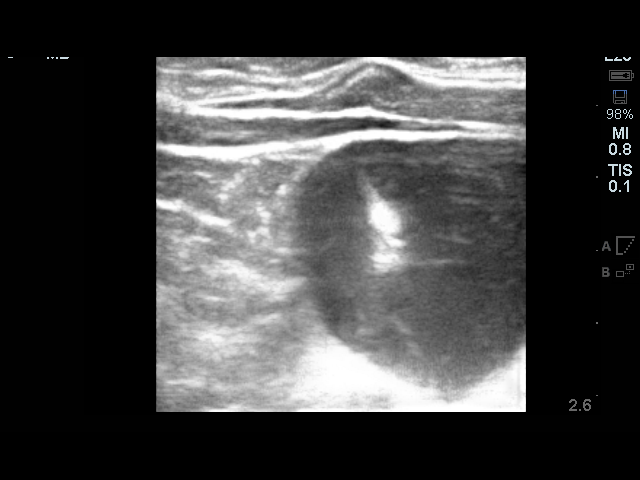

[2 of 2 positions shown; findings below may reference images not displayed]

PROCEDURE(S): FLUOROSCOPIC AND ULTRASOUND GUIDED PLACEMENT OF A
TUNNELED DIAYSIS CATHETER

Medications: Versed 1 mg, Fentanyl 25 mcg. A radiology nurse
monitored the patient for moderate sedation.  Ancef 1 gm.  As
antibiotic prophylaxis, Ancef  was ordered pre-procedure and
administered intravenously within one hour of incision.

Moderate sedation time: 20 minutes

Fluoroscopy time: 5.6minutes

Procedure:Informed consent was obtained for placement of a tunneled
dialysis catheter.  The patient was placed supine on the
interventional table.  Ultrasound confirmed a patent right internal
jugularvein.  Ultrasound images were obtained for documentation.
The right side of the neck was prepped and draped in a sterile
fashion.  The right side of the neck was anesthetized with 1%
lidocaine.  Maximal barrier sterile technique was utilized
including caps, mask, sterile gowns, sterile gloves, sterile drape,
hand hygiene and skin antiseptic.  A small incision was made with
#11 blade scalpel.  A 21 gauge needle directed into the right
internal jugular vein with ultrasound guidance.  A micropuncture
dilator set was placed.  A 23 cm tip to cuff HemoSplit catheter was
selected.  The skin below the right clavicle was anesthetized and a
small incision was made with a #11 blade scalpel.  A subcutaneous
tunnel was formed to the vein dermatotomy site.  The catheter was
brought through the tunnel.  The vein dermatotomy site was dilated
to accommodate a peel-away sheath.  The catheter was placed through
the peel-away sheath and directed into the central venous
structures.  The tip of the catheter was placed at the junction of
the SVC and right atrium with fluoroscopy.  Fluoroscopic images
were obtained for documentation.  Both lumens were found to
aspirate and flush well.  The proper amount of heparin was flushed
in both lumens.  The vein dermatotomy site was closed using a
single layer of absorbable suture and Dermabond.  The catheter was
secured to the skin using Prolene suture.
FINDINGS: Catheter tip at the junction of the SVC and right atrium.
Moderate sized right pleural effusion.

Complications: None
IMPRESSION: Successful placement of a right jugular tunneled
dialysis catheter using ultrasound and fluoroscopic guidance.

## 2014-05-20 ENCOUNTER — Encounter (HOSPITAL_COMMUNITY): Payer: Self-pay | Admitting: Vascular Surgery

## 2015-10-28 IMAGING — CR DG CHEST 1V PORT
1 series · 1 of 1 positions shown · non-contrast
Comparison: 01/09/2014

CLINICAL DATA: Pneumonitis

EXAM:
PORTABLE CHEST - 1 VIEW

[portable]
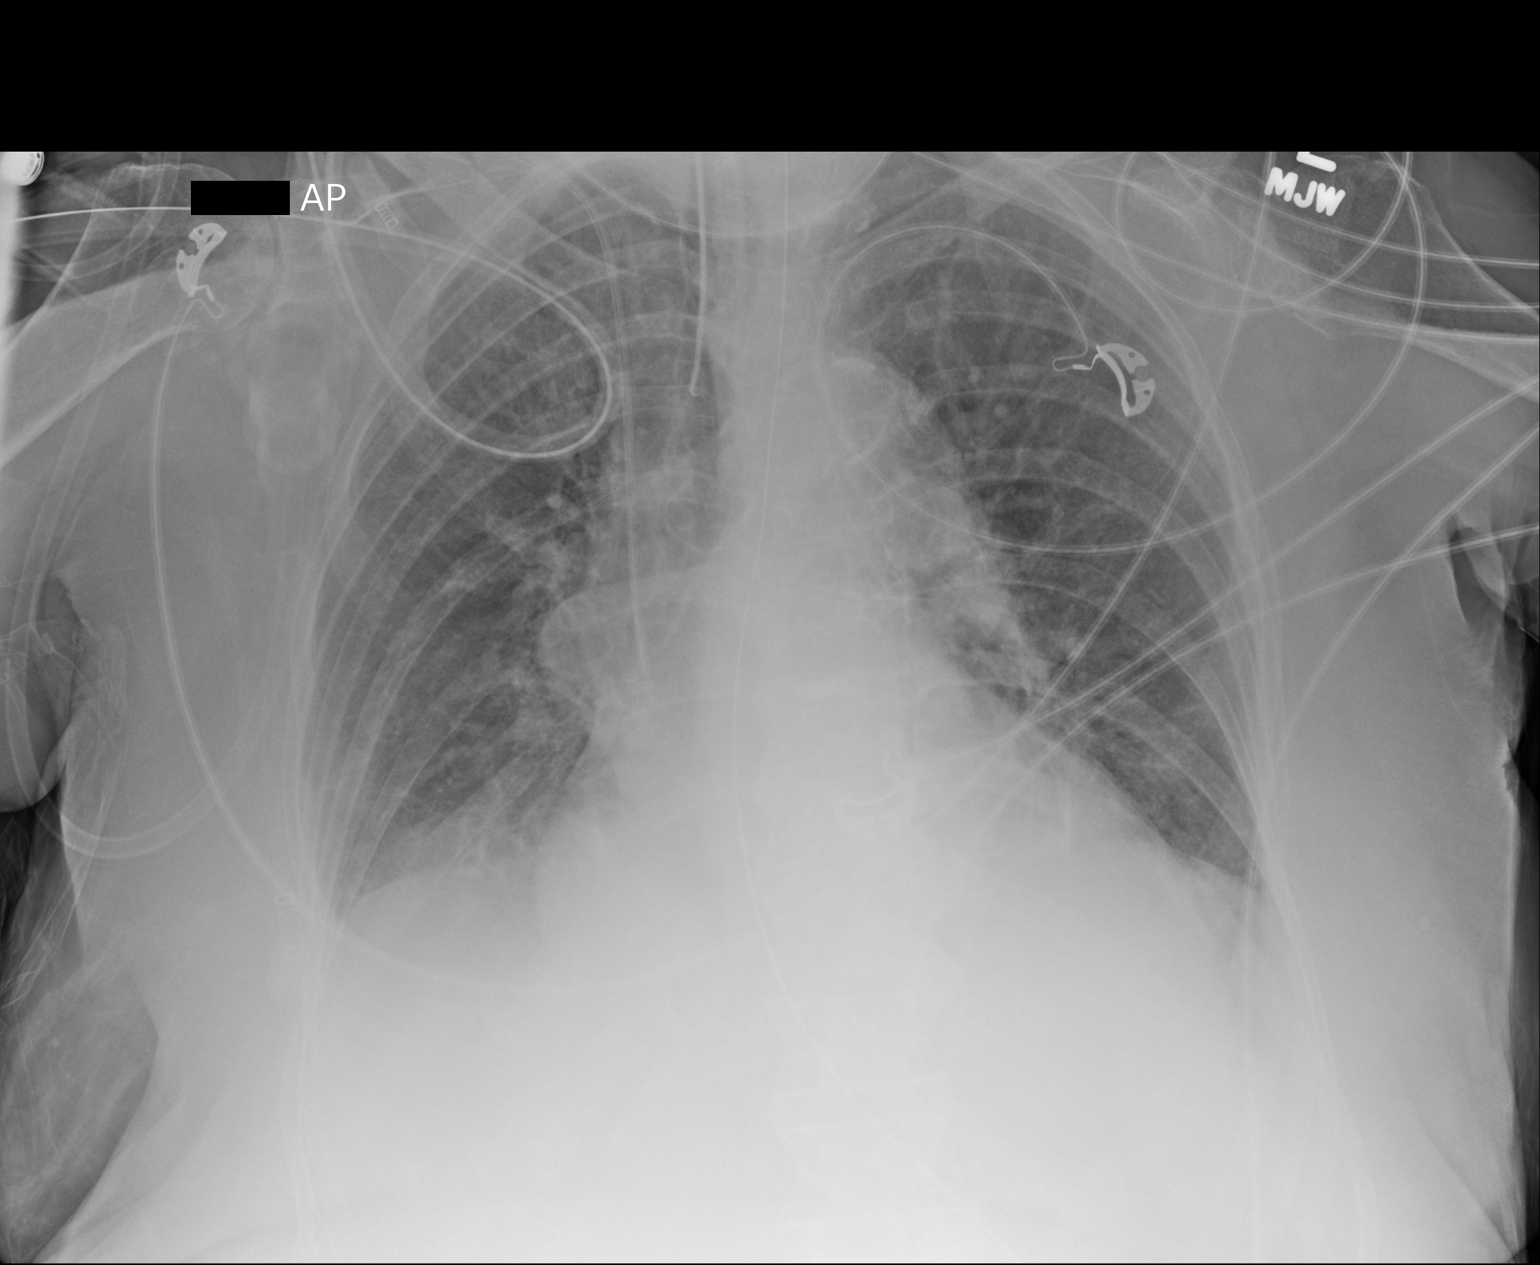

[1 of 1 positions shown; findings below may reference images not displayed]

FINDINGS: The endotracheal tube is again seen 3.6 cm above the carina and
within normal limits. A nasogastric catheter extends into the
stomach. A the right-sided central venous line is again seen and
stable. Cardiac shadow remains enlarged. The lungs are again well
aerated with mild vascular congestion. No focal infiltrate or
sizable effusion is seen.
IMPRESSION: Stable appearance of the chest.
